# Patient Record
Sex: Female | Born: 1970 | Race: White | Hispanic: No | Marital: Married | Smoking: Former smoker
Health system: Southern US, Community
[De-identification: ages and names within clinical notes are randomized; demographics above are authoritative.]

## PROBLEM LIST (undated history)

## (undated) DIAGNOSIS — M069 Rheumatoid arthritis, unspecified: Secondary | ICD-10-CM

## (undated) DIAGNOSIS — K635 Polyp of colon: Secondary | ICD-10-CM

## (undated) DIAGNOSIS — K589 Irritable bowel syndrome without diarrhea: Secondary | ICD-10-CM

## (undated) DIAGNOSIS — I729 Aneurysm of unspecified site: Secondary | ICD-10-CM

## (undated) DIAGNOSIS — I7300 Raynaud's syndrome without gangrene: Secondary | ICD-10-CM

## (undated) DIAGNOSIS — I700 Atherosclerosis of aorta: Secondary | ICD-10-CM

## (undated) DIAGNOSIS — Q453 Other congenital malformations of pancreas and pancreatic duct: Secondary | ICD-10-CM

## (undated) DIAGNOSIS — J439 Emphysema, unspecified: Secondary | ICD-10-CM

## (undated) DIAGNOSIS — K219 Gastro-esophageal reflux disease without esophagitis: Secondary | ICD-10-CM

## (undated) DIAGNOSIS — K580 Irritable bowel syndrome with diarrhea: Secondary | ICD-10-CM

## (undated) HISTORY — DX: Atherosclerosis of aorta: I70.0

## (undated) HISTORY — DX: Gastro-esophageal reflux disease without esophagitis: K21.9

## (undated) HISTORY — DX: Other congenital malformations of pancreas and pancreatic duct: Q45.3

## (undated) HISTORY — PX: ELBOW SURGERY: SHX618

## (undated) HISTORY — DX: Irritable bowel syndrome with diarrhea: K58.0

## (undated) HISTORY — DX: Irritable bowel syndrome, unspecified: K58.9

## (undated) HISTORY — PX: SPINE SURGERY: SHX786

## (undated) HISTORY — DX: Polyp of colon: K63.5

## (undated) HISTORY — DX: Raynaud's syndrome without gangrene: I73.00

## (undated) HISTORY — PX: BRAIN SURGERY: SHX531

## (undated) HISTORY — DX: Aneurysm of unspecified site: I72.9

## (undated) HISTORY — DX: Emphysema, unspecified: J43.9

## (undated) HISTORY — DX: Rheumatoid arthritis, unspecified: M06.9

---
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
## 1999-03-12 HISTORY — PX: LUMBAR SPINE SURGERY: SHX701

## 2008-07-22 ENCOUNTER — Ambulatory Visit: Payer: Self-pay | Admitting: Family Medicine

## 2008-08-15 ENCOUNTER — Ambulatory Visit: Payer: Self-pay | Admitting: Family Medicine

## 2008-08-15 DIAGNOSIS — R599 Enlarged lymph nodes, unspecified: Secondary | ICD-10-CM | POA: Insufficient documentation

## 2008-08-19 ENCOUNTER — Telehealth: Payer: Self-pay | Admitting: Family Medicine

## 2008-08-22 ENCOUNTER — Encounter: Payer: Self-pay | Admitting: Family Medicine

## 2008-11-30 ENCOUNTER — Ambulatory Visit: Payer: Self-pay | Admitting: Family Medicine

## 2009-05-04 ENCOUNTER — Encounter: Payer: Self-pay | Admitting: Family Medicine

## 2009-05-05 ENCOUNTER — Telehealth (INDEPENDENT_AMBULATORY_CARE_PROVIDER_SITE_OTHER): Payer: Self-pay | Admitting: *Deleted

## 2009-12-21 ENCOUNTER — Ambulatory Visit: Payer: Self-pay | Admitting: Family Medicine

## 2009-12-21 DIAGNOSIS — R3915 Urgency of urination: Secondary | ICD-10-CM | POA: Insufficient documentation

## 2009-12-21 LAB — CONVERTED CEMR LAB
Bilirubin Urine: NEGATIVE
Glucose, Urine, Semiquant: NEGATIVE
Ketones, urine, test strip: NEGATIVE
Nitrite: NEGATIVE
Protein, U semiquant: 30
Specific Gravity, Urine: 1.02
Urobilinogen, UA: 0.2
pH: 6

## 2009-12-23 ENCOUNTER — Encounter: Payer: Self-pay | Admitting: Family Medicine

## 2010-01-10 ENCOUNTER — Ambulatory Visit: Payer: Self-pay | Admitting: Family Medicine

## 2010-01-10 DIAGNOSIS — I7300 Raynaud's syndrome without gangrene: Secondary | ICD-10-CM | POA: Insufficient documentation

## 2010-01-10 DIAGNOSIS — Z862 Personal history of diseases of the blood and blood-forming organs and certain disorders involving the immune mechanism: Secondary | ICD-10-CM | POA: Insufficient documentation

## 2010-01-12 LAB — CONVERTED CEMR LAB
ALT: 15 units/L (ref 0–35)
ANA Titer 1: 1:40 {titer} — ABNORMAL HIGH
AST: 15 units/L (ref 0–37)
Albumin: 4.4 g/dL (ref 3.5–5.2)
Alkaline Phosphatase: 36 units/L — ABNORMAL LOW (ref 39–117)
Anti Nuclear Antibody(ANA): POSITIVE — AB
BUN: 10 mg/dL (ref 6–23)
CO2: 24 meq/L (ref 19–32)
Calcium: 9.6 mg/dL (ref 8.4–10.5)
Chloride: 108 meq/L (ref 96–112)
Creatinine, Ser: 0.72 mg/dL (ref 0.40–1.20)
Glucose, Bld: 75 mg/dL (ref 70–99)
HCT: 43 % (ref 36.0–46.0)
Hemoglobin: 13.7 g/dL (ref 12.0–15.0)
Iron: 111 ug/dL (ref 42–145)
MCHC: 31.9 g/dL (ref 30.0–36.0)
MCV: 95.6 fL (ref 78.0–100.0)
Platelets: 170 10*3/uL (ref 150–400)
Potassium: 4.5 meq/L (ref 3.5–5.3)
RBC: 4.5 M/uL (ref 3.87–5.11)
RDW: 13.3 % (ref 11.5–15.5)
Sed Rate: 2 mm/hr (ref 0–22)
Sodium: 144 meq/L (ref 135–145)
TSH: 0.679 microintl units/mL (ref 0.350–4.500)
Total Bilirubin: 0.4 mg/dL (ref 0.3–1.2)
Total Protein: 6.6 g/dL (ref 6.0–8.3)
WBC: 7.2 10*3/uL (ref 4.0–10.5)

## 2010-04-10 NOTE — Assessment & Plan Note (Signed)
Summary: Possible raynauds, anemia   Vital Signs:  Patient profile:   40 year old female Height:      64.25 inches Weight:      120 pounds Pulse rate:   81 / minute BP sitting:   115 / 69  (right arm) Cuff size:   regular  Vitals Entered By: Avon Gully CMA, Duncan Dull) (January 10, 2010 9:17 AM) CC: numbness in the fingers and toes when it gets cold and at night, gets painful and tight at times has been happening x 1 year   Primary Care Provider:  [name], MD  CC:  numbness in the fingers and toes when it gets cold and at night and gets painful and tight at times has been happening x 1 year.  History of Present Illness: numbness in the fingers and toes when it gets cold and at night, gets painful and tight at times has been happening x 1 year. First time happped was at a soccer game last Spring. Says the fingers  looked greeen and shriveled form the PIPs up. Got the car and heated them up adn saw the color come back to them gradually like a line going up her fingers.  Very painful when this happens. Has been happending recurrently.  Now 2 toes on her right foot do it.  Takes about 20 minutes to get feeling back in her hands.  Will usually start with her right hand and usually the 4th and 5th fingers adn then will affect the other finger and her left hand.  Occ hands go numb at night but without the pain or color change. The enumbness that happens at night is usually on the side she is sleeping on.  Doesn't happen every night.   Has been having sweats at night since had her last child.  Also some weight loss after her child.  She was wondered if it could be her thyroid. Also she did have iron def anemia during her pregancy and this has not been rechecked.   Warts on her hand have been there for 10 years. Had had 3 unsuccessful freezes with liquid nitrogen.   Current Medications (verified): 1)  None  Allergies (verified): No Known Drug Allergies  Comments:  Nurse/Medical  Assistant: The patient's medications and allergies were reviewed with the patient and were updated in the Medication and Allergy Lists. Avon Gully CMA, Duncan Dull) (January 10, 2010 9:19 AM)  Social History: Reviewed history from 07/22/2008 and no changes required. 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  Married to scott wiht 3 kids.  Daughter Ashlyn.   Current Smoker Alcohol use-yes Drug use-no Regular exercise-yes  Physical Exam  General:  Well-developed,well-nourished,in no acute distress; alert,appropriate and cooperative throughout examination Skin:  Hands appear normal today. No swellking . no joint a abnormalities or redness.    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 will check ANA and sed rate to rule out other causes. Will also check her thyroid as has had night sweats for some time.  I think the numbness at night is completelyunrelaed and is likely positional to her side sleeping, since occurs on teh side she is sleeping on.  Dsicussed strategies to keep her hands warm.  Can sometimes use calcium channel blockers but these will likely lower her BP and she already runs low.  Orders: T-TSH [id number]) T-Comprehensive Metabolic Panel [phone]) T-CBC No Diff ([id number]) T-ANA (  [phone]) T-Sed Rate (Automated) [phone])  Problem # 2:  ANEMIA, HX OF (ICD-V12.3) Will recheck to make sure her iron def during pregnancy has resolved.  Orders: T-Iron ([id number])  Problem # 3:  WART, VIRAL (ICD-078.10) Reviewd the duct tape method. H.O. given.  Does have a wart on her first and ring finger on her left hand.   Other Orders: Flu Vaccine 61yrs + (01027) Admin 1st Vaccine (25366)  Patient Instructions: 1)  We will call you with the lab results.     Orders Added: 1)  T-TSH [[id number]] 2)  T-Comprehensive Metabolic Panel [[id number]] 3)  T-CBC No Diff  [[id number]] 4)  T-Iron [56387-56433] 5)  T-ANA [[id number]] 6)  T-Sed Rate (Automated) [06301-60109] 7)  Flu Vaccine 54yrs + [32355] 8)  Admin 1st Vaccine [90471] 9)  Est. Patient Level IV [73220]   Immunizations Administered:  Influenza Vaccine # 1:    Vaccine Type: Fluvax 3+    Site: right deltoid    Mfr: GlaxoSmithKline    Dose: 0.5 ml    Route: IM    Given by: Sue Lush McCrimmon CMA, (AAMA)    Exp. Date: 09/08/2010    Lot #: [id number]    VIS given: 10/03/09 version given January 10, 2010.  Flu Vaccine Consent Questions:    Do you have a history of severe allergic reactions to this vaccine? no    Any prior history of allergic reactions to egg and/or gelatin? no    Do you have a sensitivity to the preservative Thimersol? no    Do you have a past history of Guillan-Barre Syndrome? no    Do you currently have an acute febrile illness? no    Have you ever had a severe reaction to latex? no    Vaccine information given and explained to patient? no    Are you currently pregnant? no   Immunizations Administered:  Influenza Vaccine # 1:    Vaccine Type: Fluvax 3+    Site: right deltoid    Mfr: GlaxoSmithKline    Dose: 0.5 ml    Route: IM    Given by: Sue Lush McCrimmon CMA, (AAMA)    Exp. Date: 09/08/2010    Lot #: [id number]    VIS given: 10/03/09 version given January 10, 2010.

## 2010-04-10 NOTE — Assessment & Plan Note (Signed)
Summary: LN, right breast   Vital Signs:  Patient profile:   40 year old female Height:      64.25 inches Weight:      117 pounds BMI:     20.00 Pulse rate:   72 / minute BP sitting:   94 / 62  (left arm) Cuff size:   regular  Vitals Entered By: Kathlene November (August 15, 2008 10:12 AM) CC: noticed yesterday that right side of right breast tender and swollen lymph node Is Patient Diabetic? No   Primary Care Provider:  [name], MD  CC:  noticed yesterday that right side of right breast tender and swollen lymph node.  History of Present Illness: noticed yesterday that right side of right breast tender and swollen lymph node.  Noticed yesterday when picked up her child and it pressed on that area and felt tender. Denies any redness or trauma or skin lesion.  She is not breastfeeding.  Has avoided wearing a bra since  noticed it.  She has mirena IUD so doesn' really have periods.   Current Medications (verified): 1)  None  Allergies (verified): No Known Drug Allergies  Comments:  Nurse/Medical Assistant: The patient's medications and allergies were reviewed with the patient and were updated in the Medication and Allergy Lists. Kathlene November (August 15, 2008 10:13 AM)  Physical Exam  General:  Well-developed,well-nourished,in no acute distress; alert,appropriate and cooperative throughout examination Head:  Normocephalic and atraumatic without obvious abnormalities. No apparent alopecia or balding. Neck:  No Supraclavicular nodes.  No TM.  Breasts:  skin/areolae normal and no abnormal thickening.  Left breast larger then right.  Able to palpate a kidney bean sized LN that is round and mobile along the outer edge of the breast at 9 o'clock position. She feels a little tender below the actual LN.  Cervical Nodes:  No lymphadenopathy noted Axillary Nodes:  No palpable lymphadenopathy   Impression & Recommendations:  Problem # 1:  LYMPHADENOPATHY (ICD-785.6) Discussed to  monitor for 1-2 weeks to see if resovles. If not pt to call back and wills et up for [place] and diagnostic mammogram.

## 2010-04-10 NOTE — Assessment & Plan Note (Signed)
Summary: NOV: Meet MD   Vital Signs:  Patient profile:   41 year old female Height:      64.25 inches Weight:      118 pounds Temp:     98.4 degrees F oral Pulse rate:   69 / minute Resp:     18 per minute BP sitting:   100 / 52  (left arm)  Vitals Entered By: Jeremy Johann CMA (Jul 22, 2008 9:13 AM) CC: new to establish   History of Present Illness: Here to meet physician and see if this a good fit for her family. Has been dringing to [facility] for the last 13 year to see her family doc there.  Wants something more local.   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  Married to scott wiht 3 kids.  Daughter Ashlyn.      Current Smoker    Alcohol use-yes    Drug use-no    Regular exercise-yes    Smoking Status:  current    Packs/Day:  0.5    Does Patient Exercise:  yes    Drug Use:  no    Seat Belt Use:  always    Sun Exposure-Excessive:  frequent  Review of Systems       No fever/sweats/weakness, unexplained weight loss/gain.  No vison changes.  No difficulty hearing/ringing in ears, hay fever/allergies.  No chest pain/discomfort, palpitations.  No Br lump/nipple discharge.  No cough/wheeze.  No blood in BM, nausea/vomiting/diarrhea.  No nighttime urination, leaking urine, unusual vaginal bleeding, discharge (penis or vagina).  No muscle/joint pain. No rash, change in mole.  No HA, memory  loss.  No anxiety, sleep d/o, depression.  No easy bruising/bleeding, unexplained lump   Physical Exam  General:  Well-developed,well-nourished,in no acute distress; alert,appropriate and cooperative throughout examination   Other Orders: No Charge Patient Arrived (NCPA0) (NCPA0)

## 2010-04-10 NOTE — Assessment & Plan Note (Signed)
Summary: UTI  Nurse Visit   Primary Care Provider:  [name], MD   History of Present Illness: urinary urgency,pressure in abd, signs started Monday.     Impression & Recommendations:  Problem # 1:  URINARY URGENCY (ICD-788.63) Will tx and sende culture.  Orders: UA Dipstick w/o Micro (automated)  (81003) T-Urine Culture (Spectrum Order) [id number])  Complete Medication List: 1)  Cipro 500 Mg Tabs (Ciprofloxacin hcl) .... Take 1 tablet by mouth two times a day for 3 days   Allergies: No Known Drug Allergies Laboratory Results   Urine Tests  Date/Time Received: 12/21/09 Date/Time Reported: 12/21/09  Routine Urinalysis   Color: yellow Appearance: Clear Glucose: negative   (Normal Range: Negative) Bilirubin: negative   (Normal Range: Negative) Ketone: negative   (Normal Range: Negative) Spec. Gravity: 1.020   (Normal Range: 1.003-1.035) Blood: moderate   (Normal Range: Negative) pH: 6.0   (Normal Range: 5.0-8.0) Protein: 30   (Normal Range: Negative) Urobilinogen: 0.2   (Normal Range: 0-1) Nitrite: negative   (Normal Range: Negative) Leukocyte Esterace: moderate   (Normal Range: Negative)       Orders Added: 1)  UA Dipstick w/o Micro (automated)  [81003] 2)  T-Urine Culture (Spectrum Order) [[id number]] 3)  Est. Patient Level I [43329] Prescriptions: CIPRO 500 MG TABS (CIPROFLOXACIN HCL) Take 1 tablet by mouth two times a day for 3 days  #6 x 0   Entered and Authorized by:   [name] MD   Signed by:   [name] MD on 12/21/2009   Method used:   Printed then faxed to ...         RxID:   [id number]

## 2010-04-10 NOTE — Progress Notes (Signed)
----   Converted from flag ---- ---- 05/04/2009 7:51 AM, [name] MD wrote: Call pt: Needs repeat right mammo to f/u the probable LN. wAs due in December and the imaging place has been unable to contact her. ------------------------------  Pt notifeid of above instructions. KJ LPN

## 2010-04-10 NOTE — Progress Notes (Signed)
Summary: Mammogram  Phone Note Call from Patient Call back at Home Phone [phone]   Caller: Patient Call For: [name] MD Summary of Call: Pt would like to go ahead and get the mamogram done for her breast. Wants to use [facility] in [place] or [place]. Initial call taken by: Kathlene November,  August 19, 2008 9:47 AM

## 2010-04-10 NOTE — Letter (Signed)
Summary: Follow Up Information Form/[facility]  Follow Up Information Form/[facility]   Imported By: Lanelle Bal 05/09/2009 14:15:48  _____________________________________________________________________  External Attachment:    Type:   Image     Comment:   External Document

## 2010-04-10 NOTE — Assessment & Plan Note (Signed)
Summary: Acute sinusitis   Vital Signs:  Patient profile:   40 year old female Height:      64.25 inches Weight:      118 pounds Temp:     97.8 degrees F oral Pulse rate:   69 / minute BP sitting:   118 / 64  (left arm) Cuff size:   regular  Vitals Entered By: Kathlene November (November 30, 2008 10:58 AM) CC: sinus pressure and H/A for 1 1/2 weeks. Sudafed helped some   Primary Care Provider:  [name], MD  CC:  sinus pressure and H/A for 1 1/2 weeks. Sudafed helped some.  History of Present Illness: sinus pressure and H/A for 1 1/2 weeks. Sudafed OTC  helped some.  Today is better.  Sudafed makes her feel "loopy".  Pressure not quite as bad.  No fever. NO n/v/d.  No ear pain or ST.  HA nad pressure is behind and underneath the eyes.  No worsening sxs.    Current Medications (verified): 1)  None  Allergies (verified): No Known Drug Allergies  Comments:  Nurse/Medical Assistant: The patient's medications and allergies were reviewed with the patient and were updated in the Medication and Allergy Lists. Kathlene November (November 30, 2008 10:58 AM)  Physical Exam  General:  Well-developed,well-nourished,in no acute distress; alert,appropriate and cooperative throughout examination Head:  Normocephalic and atraumatic without obvious abnormalities. No apparent alopecia or balding. Eyes:  No corneal or conjunctival inflammation noted. EOMI. Perrla.  Ears:  External ear exam shows no significant lesions or deformities.  Otoscopic examination reveals clear canals, tympanic membranes are intact bilaterally without bulging, retraction, inflammation or discharge. Hearing is grossly normal bilaterally. Nose:  External nasal examination shows no deformity or inflammation. Nasal mucosa are pink and moist without lesions or exudates. Mouth:  Oral mucosa and oropharynx without lesions or exudates.  Teeth in good repair. Neck:  No deformities, masses, or tenderness noted. Lungs:  Normal  respiratory effort, chest expands symmetrically. Lungs are clear to auscultation, no crackles or wheezes. Heart:  Normal rate and regular rhythm. S1 and S2 normal without gallop, murmur, click, rub or other extra sounds. Skin:  no rashes.   Cervical Nodes:  No lymphadenopathy noted Psych:  Cognition and judgment appear intact. Alert and cooperative with normal attention span and concentration. No apparent delusions, illusions, hallucinations   Impression & Recommendations:  Problem # 1:  SINUSITIS - ACUTE-NOS (ICD-461.9) Seems a little better today. Give is a couple of more days if not resolving then can fill the rx.   Her updated medication list for this problem includes:    Amoxicillin 500 Mg Cap (Amoxicillin) .Marland Kitchen... Take 1 capsule by mouth three times a day x 10 days  Instructed on treatment. Call if symptoms persist or worsen.   Complete Medication List: 1)  Amoxicillin 500 Mg Cap (Amoxicillin) .... Take 1 capsule by mouth three times a day x 10 days Prescriptions: AMOXICILLIN 500 MG CAP (AMOXICILLIN) Take 1 capsule by mouth three times a day X 10 days  #30 x 0   Entered and Authorized by:   [name] MD   Signed by:   [name] MD on 11/30/2008   Method used:   Print then Give to Patient   RxID:   [id number]

## 2010-05-14 ENCOUNTER — Encounter: Payer: Self-pay | Admitting: Family Medicine

## 2010-05-14 LAB — CONVERTED CEMR LAB: Pap Smear: NORMAL

## 2010-05-17 ENCOUNTER — Encounter: Payer: Self-pay | Admitting: Family Medicine

## 2010-05-22 NOTE — Miscellaneous (Signed)
Summary: Pap update.   Clinical Lists Changes  Observations: Added new observation of PAP DUE: 05/2011 (05/17/2010 12:01) Added new observation of PAP SMEAR: normal (05/14/2010 12:01)      Preventive Care Screening  Pap Smear:    Date:  05/14/2010    Next Due:  05/2011    Results:  normal

## 2011-03-07 ENCOUNTER — Ambulatory Visit (INDEPENDENT_AMBULATORY_CARE_PROVIDER_SITE_OTHER): Payer: Managed Care, Other (non HMO) | Admitting: Physician Assistant

## 2011-03-07 ENCOUNTER — Encounter: Payer: Self-pay | Admitting: Physician Assistant

## 2011-03-07 VITALS — BP 101/59 | HR 70 | Temp 98.4°F | Ht 64.0 in | Wt 128.0 lb

## 2011-03-07 DIAGNOSIS — J329 Chronic sinusitis, unspecified: Secondary | ICD-10-CM

## 2011-03-07 MED ORDER — AMOXICILLIN 875 MG PO TABS: 875.0000 mg | ORAL_TABLET | Freq: Two times a day (BID) | ORAL | Status: AC

## 2011-03-07 NOTE — Progress Notes (Signed)
Subjective:     Patient ID: Dawn Griffin, female   DOB: Jul 16, 1970, 40 y.o.   MRN: [id number]  HPI Patient has had 1 month of sinus pressure, headaches, and dizziness. She began taking sudafed and it helped for a few weeks. Symptoms began to get worse about 1 1/2 weeks ago and she started to feel nauseous all the time. She reports that her whole head hurts even into her teeth. She denies fever, sore throat, chills, and ear pain. She has had some cough but not productive.    Review of Systems  Constitutional: Negative for fever, chills and diaphoresis.  HENT: Positive for congestion, postnasal drip and sinus pressure. Negative for ear pain, sore throat and rhinorrhea.   Respiratory: Positive for cough. Negative for chest tightness, shortness of breath and wheezing.   Skin: Negative for color change and pallor.  Neurological: Positive for dizziness and headaches.       Objective:   Physical Exam  Constitutional: She appears well-developed and well-nourished.  HENT:  Head: Normocephalic and atraumatic.  Right Ear: External ear normal.  Left Ear: External ear normal.  Mouth/Throat: No oropharyngeal exudate.       Oropharynx was erythematous with post nasal drip evident. Maxillary tenderness(Mild) bilaterally.  Eyes: Conjunctivae are normal.  Cardiovascular: Normal rate, regular rhythm and normal heart sounds.   Pulmonary/Chest: Effort normal and breath sounds normal. She has no wheezes. She exhibits no tenderness.  Lymphadenopathy:    She has cervical adenopathy.  Psychiatric: She has a normal mood and affect. Her behavior is normal.       Assessment:    Sinusitis    Plan:    Sinusitis: Amoxillcillin 875mg  BID for 10days given. Symptomatic Care discussed. F/U if not improving in 2-3 days.  Discussed with patient need for Tdap and Flu shot. Patient wanted to wait until the first of the year because the insurance company will pay then.

## 2011-03-07 NOTE — Patient Instructions (Signed)
Start Amoxicillin. Symptomatic Care. Call if not improving.

## 2013-01-11 ENCOUNTER — Encounter: Payer: Self-pay | Admitting: Family Medicine

## 2013-01-11 ENCOUNTER — Ambulatory Visit (INDEPENDENT_AMBULATORY_CARE_PROVIDER_SITE_OTHER): Payer: Managed Care, Other (non HMO) | Admitting: Family Medicine

## 2013-01-11 VITALS — BP 112/66 | HR 72 | Wt 126.0 lb

## 2013-01-11 DIAGNOSIS — Z0000 Encounter for general adult medical examination without abnormal findings: Secondary | ICD-10-CM

## 2013-01-11 DIAGNOSIS — M2550 Pain in unspecified joint: Secondary | ICD-10-CM

## 2013-01-11 DIAGNOSIS — Z23 Encounter for immunization: Secondary | ICD-10-CM

## 2013-01-11 MED ORDER — ESOMEPRAZOLE MAGNESIUM 40 MG PO CPDR: 40.0000 mg | DELAYED_RELEASE_CAPSULE | Freq: Every day | ORAL | Status: DC

## 2013-01-11 NOTE — Progress Notes (Signed)
Subjective:     Dawn Griffin is a 42 y.o. female and is here for a comprehensive physical exam. The patient reports no problems. She has never had a Mammogram, refused flu vaccine, tdap today, Pap 10/09/2012 normal w/ [place].   Having a lot of pain at the base of thumbs bilat.  Worse on the left.  No known fam hx of RA but GM had very deformed. She has hand stiffness for about 30-60 min   History   Social History  . Marital Status: Married    Spouse Name: N/A    Number of Children: N/A  . Years of Education: N/A   Occupational History  . Not on file.   Social History Main Topics  . Smoking status: Current Every Day Smoker -- 0.30 packs/day    Types: Cigarettes  . Smokeless tobacco: Not on file  . Alcohol Use: Not on file  . Drug Use: Not on file  . Sexual Activity: Not on file   Other Topics Concern  . Not on file   Social History Narrative  . No narrative on file   Health Maintenance  Topic Date Due  . Influenza Vaccine  10/11/2013  . Pap Smear  10/10/2015  . Tetanus/tdap  01/12/2023    The following portions of the patient's history were reviewed and updated as appropriate: allergies, current medications, past family history, past medical history, past social history, past surgical history and problem list.  Review of Systems A comprehensive review of systems was negative.   Objective:    BP 112/66  Pulse 72  Wt 126 lb (57.153 kg)  SpO2 99% General appearance: alert, cooperative and appears stated age Head: Normocephalic, without obvious abnormality, atraumatic Eyes: conj clear, EOMi, PEERLA Ears: normal TM's and external ear canals both ears Nose: Nares normal. Septum midline. Mucosa normal. No drainage or sinus tenderness. Throat: lips, mucosa, and tongue normal; teeth and gums normal Neck: no adenopathy, no carotid bruit, no JVD, supple, symmetrical, trachea midline and thyroid not enlarged, symmetric, no tenderness/mass/nodules Back: symmetric, no  curvature. ROM normal. No CVA tenderness. Lungs: clear to auscultation bilaterally Heart: regular rate and rhythm, S1, S2 normal, no murmur, click, rub or gallop Abdomen: soft, non-tender; bowel sounds normal; no masses,  no organomegaly Extremities: extremities normal, atraumatic, no cyanosis or edema Pulses: 2+ and symmetric Skin: Skin color, texture, turgor normal. No rashes or lesions Lymph nodes: Cervical, supraclavicular, and axillary nodes normal. Neurologic: Alert and oriented X 3, normal strength and tone. Normal symmetric reflexes. Normal coordination and gait    Assessment:    Healthy female exam.      Plan:     See After Visit Summary for Counseling Recommendations  Keep up a regular exercise program and make sure you are eating a healthy diet Try to eat 4 servings of dairy a day, or if you are lactose intolerant take a calcium with vitamin D daily.  Your vaccines are up to date.   GERD - would like rx for nexium sent to mail-order.  Warned about inc risk of fractures.    Discussed screening mammogram. Based on different t guidelines, the recommendation is for age 80 versus age 71 to start screening mammograms. She is low risk with no family history. Encouraged her to think about it. She also wants to check with her insurance company first. I am happy to order it or she can certainly have this ordered through her OB/GYN. Her Pap smear is up-to-date. She did have a  mammogram years ago for a swollen lymph node after pregnancy but has not had any personal history of breast problems or abnormal lumps.3  Polyarthralgia - Doesn't meet criteria for RA but has some features.  Will check labs and offered to do xrays. She does have some squaring off on of the thumb joint near the wrist.  Does have some swelling on th left hand.  Also gave her Dt. T' s card for further evaluation is having significant discomfort.

## 2013-01-12 LAB — COMPLETE METABOLIC PANEL WITH GFR
ALT: 17 U/L (ref 0–35)
AST: 16 U/L (ref 0–37)
Albumin: 4.1 g/dL (ref 3.5–5.2)
Alkaline Phosphatase: 38 U/L — ABNORMAL LOW (ref 39–117)
BUN: 10 mg/dL (ref 6–23)
CO2: 26 mEq/L (ref 19–32)
Calcium: 9.1 mg/dL (ref 8.4–10.5)
Chloride: 105 mEq/L (ref 96–112)
Creat: 0.71 mg/dL (ref 0.50–1.10)
GFR, Est African American: >89 mL/min
GFR, Est Non African American: >89 mL/min
Glucose, Bld: 86 mg/dL (ref 70–99)
Potassium: 4.1 mEq/L (ref 3.5–5.3)
Sodium: 138 mEq/L (ref 135–145)
Total Bilirubin: 0.4 mg/dL (ref 0.3–1.2)
Total Protein: 6.3 g/dL (ref 6.0–8.3)

## 2013-01-12 LAB — LIPID PANEL
Cholesterol: 144 mg/dL (ref 0–200)
HDL: 40 mg/dL (ref >39)
LDL Cholesterol: 81 mg/dL (ref 0–99)
Total CHOL/HDL Ratio: 3.6 Ratio
Triglycerides: 117 mg/dL (ref <150)
VLDL: 23 mg/dL (ref 0–40)

## 2013-01-12 LAB — SEDIMENTATION RATE: Sed Rate: 1 mm/hr (ref 0–22)

## 2013-01-12 LAB — HIGH SENSITIVITY CRP: CRP, High Sensitivity: 4.2 mg/L — ABNORMAL HIGH

## 2013-01-12 LAB — RHEUMATOID FACTOR: Rhuematoid fact SerPl-aCnc: 14 IU/mL (ref <=14)

## 2013-01-13 LAB — ANA: Anti Nuclear Antibody(ANA): POSITIVE — AB

## 2013-01-13 LAB — CYCLIC CITRUL PEPTIDE ANTIBODY, IGG: Cyclic Citrullin Peptide Ab: <2 U/mL (ref 0.0–5.0)

## 2013-01-13 LAB — ANTI-NUCLEAR AB-TITER (ANA TITER): ANA Titer 1: 1:160 {titer} — ABNORMAL HIGH

## 2013-01-14 ENCOUNTER — Other Ambulatory Visit: Payer: Self-pay | Admitting: Family Medicine

## 2013-01-14 DIAGNOSIS — R768 Other specified abnormal immunological findings in serum: Secondary | ICD-10-CM

## 2013-01-14 DIAGNOSIS — M2550 Pain in unspecified joint: Secondary | ICD-10-CM

## 2013-01-15 ENCOUNTER — Telehealth: Payer: Self-pay | Admitting: *Deleted

## 2013-01-15 NOTE — Telephone Encounter (Signed)
Pt called and wanted to find out about the referral for rheumatology, I left a vm informing her that this is being worked on and that someone from either our office or the rheumatology ofc should contact her.  If she has not heard anything by midweek she should call our office back.Dawn Griffin [place]

## 2013-01-19 NOTE — Telephone Encounter (Signed)
Pt called back and stated that she still has not heard anything about the rheumatology referral and she would like to have this taken care of before the end of the year so that she will not have to meet another deductible with insurance. Also she would prefer not to go to the woman rheumatologist here in [place] please call pt with appt information.Dawn Griffin [place]

## 2013-01-21 ENCOUNTER — Telehealth: Payer: Self-pay

## 2013-01-21 NOTE — Telephone Encounter (Signed)
She wants to go to [facility] Rheumatology department. She also needs and appointment this year.  [phone]

## 2013-01-28 DIAGNOSIS — I7301 Raynaud's syndrome with gangrene: Secondary | ICD-10-CM | POA: Insufficient documentation

## 2013-01-28 DIAGNOSIS — R768 Other specified abnormal immunological findings in serum: Secondary | ICD-10-CM | POA: Insufficient documentation

## 2013-01-28 DIAGNOSIS — I7300 Raynaud's syndrome without gangrene: Secondary | ICD-10-CM | POA: Insufficient documentation

## 2013-02-26 ENCOUNTER — Ambulatory Visit (INDEPENDENT_AMBULATORY_CARE_PROVIDER_SITE_OTHER): Payer: Managed Care, Other (non HMO) | Admitting: Sports Medicine

## 2013-02-26 ENCOUNTER — Encounter: Payer: Self-pay | Admitting: Sports Medicine

## 2013-02-26 VITALS — BP 119/68 | HR 76 | Wt 128.0 lb

## 2013-02-26 DIAGNOSIS — M19049 Primary osteoarthritis, unspecified hand: Secondary | ICD-10-CM

## 2013-02-26 DIAGNOSIS — R0602 Shortness of breath: Secondary | ICD-10-CM | POA: Insufficient documentation

## 2013-02-26 MED ORDER — CELECOXIB 200 MG PO CAPS: ORAL_CAPSULE | ORAL | Status: DC

## 2013-02-26 NOTE — Assessment & Plan Note (Addendum)
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 Insufficient response to 200 mg of Celebrex daily. Increasing to 400. Injection into the trapezial metacarpal joint as well. May use topical diclofenac gel. Return to see me in one month. When she gets flares in individual joints I can certainly provide acute interventional treatment as needed.

## 2013-02-26 NOTE — Progress Notes (Addendum)
   Subjective:    I'm seeing this patient as a consultation for:  Dr. Linford Arnold  CC: Left hand pain  HPI: This is a very pleasant 42 year old female with a history of polyarthralgia, she also has Raynaud's phenomenon, occasionally she will get diffuse swelling of her interphalangeal joints. Unfortunately for some time now she's had pain that she localizes to the left trapeziometacarpal joint. She denies any trauma or any constitutional symptoms. She has seen a rheumatologist and has a positive ANA and a positive rheumatoid factor but negative confirmatory testing. She does not quite fall into the category of rheumatoid arthritis or lupus. Symptoms are moderate, persistent, localized without radiation. 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  Past medical history, Surgical history, Family history not pertinant except as noted below, Social history, Allergies, and medications have been entered into the medical record, reviewed, and no changes needed.   Review of Systems: No headache, visual changes, nausea, vomiting, diarrhea, constipation, dizziness, abdominal pain, skin rash, fevers, chills, night sweats, weight loss, swollen lymph nodes, body aches, joint swelling, muscle aches, chest pain, shortness of breath, mood changes, visual or auditory hallucinations.   Objective:   General: Well Developed, well nourished, and in no acute distress.  Neuro/Psych: Alert and oriented x3, extra-ocular muscles intact, able to move all 4 extremities, sensation grossly intact. Skin: Warm and dry, no rashes noted.  Respiratory: Not using accessory muscles, speaking in full sentences, trachea midline.  Cardiovascular: Pulses palpable, no extremity edema. Abdomen: Does not appear distended. Left hand: There is squaring of the thumb nasal joint, she has tenderness to palpation from the volar aspect at the trapeziometacarpal joint itself. Negative Finkelstein  test, no numbness or tingling. Only minimal swelling.  Procedure: Real-time Ultrasound Guided Injection of left trapeziometacarpal joint Device: GE Logiq E  Verbal informed consent obtained.  Time-out conducted.  Noted no overlying erythema, induration, or other signs of local infection.  Skin prepped in a sterile fashion.  Local anesthesia: Topical Ethyl chloride.  With sterile technique and under real time ultrasound guidance:  0.5 cc Kenalog 40, 0.5 cc lidocaine injected easily into the trapeziometacarpal joint. Completed without difficulty  Pain immediately resolved suggesting accurate placement of the medication.  Advised to call if fevers/chills, erythema, induration, drainage, or persistent bleeding.  Images permanently stored and available for review in the ultrasound unit.  Impression: Technically successful ultrasound guided injection.  Impression and Recommendations:   This case required medical decision making of moderate complexity.

## 2013-03-26 ENCOUNTER — Ambulatory Visit: Payer: Managed Care, Other (non HMO) | Admitting: Sports Medicine

## 2013-03-26 DIAGNOSIS — Z0289 Encounter for other administrative examinations: Secondary | ICD-10-CM

## 2013-07-19 ENCOUNTER — Ambulatory Visit (INDEPENDENT_AMBULATORY_CARE_PROVIDER_SITE_OTHER): Payer: Managed Care, Other (non HMO) | Admitting: Sports Medicine

## 2013-07-19 ENCOUNTER — Encounter: Payer: Self-pay | Admitting: Sports Medicine

## 2013-07-19 VITALS — BP 125/73 | HR 72 | Ht 64.0 in | Wt 131.0 lb

## 2013-07-19 DIAGNOSIS — M19049 Primary osteoarthritis, unspecified hand: Secondary | ICD-10-CM

## 2013-07-19 MED ORDER — TRAMADOL HCL 50 MG PO TABS: ORAL_TABLET | ORAL | Status: DC

## 2013-07-19 NOTE — Progress Notes (Signed)
  Subjective:    CC: Followup  HPI: Left trapezial metacarpal joint arthritis: I last injected the trapezial metacarpal joint approximately 6 months ago. She had a fantastic response with recurrence of pain a few days ago. Pain is now severe and persistent. She is getting significant swelling.  Past medical history, Surgical history, Family history not pertinant except as noted below, Social history, Allergies, and medications have been entered into the medical record, reviewed, and no changes needed.   Review of Systems: No fevers, chills, night sweats, weight loss, chest pain, or shortness of breath.   Objective:    General: Well Developed, well nourished, and in no acute distress.  Neuro: Alert and oriented x3, extra-ocular muscles intact, sensation grossly intact.  HEENT: Normocephalic, atraumatic, pupils equal round reactive to light, neck supple, no masses, no lymphadenopathy, thyroid nonpalpable.  Skin: Warm and dry, no rashes. Cardiac: Regular rate and rhythm, no murmurs rubs or gallops, no lower extremity edema.  Respiratory: Clear to auscultation bilaterally. Not using accessory muscles, speaking in full sentences. Left hand: There is swelling with a palpable fluid wave at the first carpometacarpal joint.  Procedure: Real-time Ultrasound Guided Injection of left trapezial metacarpal joint Device: GE Logiq E  Verbal informed consent obtained.  Time-out conducted.  Noted no overlying erythema, induration, or other signs of local infection.  Skin prepped in a sterile fashion.  Local anesthesia: Topical Ethyl chloride.  With sterile technique and under real time ultrasound guidance:  Noted copious synovitis around the joint, 25-gauge needle advanced into the joint and 0.5 cc Kenalog 40, 0.5 cc lidocaine injected. Completed without difficulty  Pain immediately resolved suggesting accurate placement of the medication.  Advised to call if fevers/chills, erythema, induration,  drainage, or persistent bleeding.  Images permanently stored and available for review in the ultrasound unit.  Impression: Technically successful ultrasound guided injection.  Impression and Recommendations:

## 2013-07-19 NOTE — Assessment & Plan Note (Signed)
With copious synovitis on ultrasound today. Ultrasound guided injection as above. Return in one month. Tramadol for pain.

## 2013-08-17 ENCOUNTER — Ambulatory Visit (INDEPENDENT_AMBULATORY_CARE_PROVIDER_SITE_OTHER): Payer: Managed Care, Other (non HMO) | Admitting: Sports Medicine

## 2013-08-17 ENCOUNTER — Encounter: Payer: Self-pay | Admitting: Sports Medicine

## 2013-08-17 VITALS — BP 102/61 | HR 73 | Wt 130.0 lb

## 2013-08-17 DIAGNOSIS — R209 Unspecified disturbances of skin sensation: Secondary | ICD-10-CM

## 2013-08-17 DIAGNOSIS — M19049 Primary osteoarthritis, unspecified hand: Secondary | ICD-10-CM

## 2013-08-17 DIAGNOSIS — R200 Anesthesia of skin: Secondary | ICD-10-CM | POA: Insufficient documentation

## 2013-08-17 NOTE — Progress Notes (Signed)
    Subjective:    CC: Followup  HPI: Hand osteoarthritis: Jamilah had a trapeziometacarpal injection at the last visit, she still reports pain this time not a trapeziometacarpal, she reports it more proximal at the scaphotrapezial joint.  Past medical history, Surgical history, Family history not pertinant except as noted below, Social history, Allergies, and medications have been entered into the medical record, reviewed, and no changes needed.   Review of Systems: No fevers, chills, night sweats, weight loss, chest pain, or shortness of breath.   Objective:    General: Well Developed, well nourished, and in no acute distress.  Neuro: Alert and oriented x3, extra-ocular muscles intact, sensation grossly intact.  HEENT: Normocephalic, atraumatic, pupils equal round reactive to light, neck supple, no masses, no lymphadenopathy, thyroid nonpalpable.  Skin: Warm and dry, no rashes. Cardiac: Regular rate and rhythm, no murmurs rubs or gallops, no lower extremity edema. Respiratory: Clear to auscultation bilaterally. Not using accessory muscles, speaking in full sentences. Left hand: Tender to palpation at the scaphoid/trapezial joint, trapeziometacarpal joint is nontender. She does have some grinding as the wound is taken through the range of motion.  Procedure: Real-time Ultrasound Guided Injection of left scaphoid/trapezial joint Device: GE Logiq E  Verbal informed consent obtained.  Time-out conducted.  Noted no overlying erythema, induration, or other signs of local infection.  Skin prepped in a sterile fashion.  Local anesthesia: Topical Ethyl chloride.  With sterile technique and under real time ultrasound guidance:  25-gauge needle advanced into the joint, 0.5 cc Kenalog 40, 0.5 cc lidocaine injected easily. Completed without difficulty  Pain immediately resolved suggesting accurate placement of the medication.  Advised to call if fevers/chills, erythema, induration, drainage, or  persistent bleeding.  Images permanently stored and available for review in the ultrasound unit.  Impression: Technically successful ultrasound guided injection.  Impression and Recommendations:

## 2013-08-17 NOTE — Assessment & Plan Note (Signed)
Referral for nerve conduction. She's already wearing wrist braces at night. Median nerve did look slightly large on unofficial ultrasound.

## 2013-08-17 NOTE — Assessment & Plan Note (Signed)
Injection placed into the trapezial metacarpal joint the last visit, this is still pain-free. Today we placed an injection in the scaphoid trapezial joint. Return to go over nerve conduction studies.

## 2015-03-08 LAB — HM COLONOSCOPY

## 2015-04-06 ENCOUNTER — Encounter: Payer: Self-pay | Admitting: Family Medicine

## 2015-05-29 LAB — HM MAMMOGRAPHY

## 2015-05-29 LAB — HM PAP SMEAR: HM Pap smear: ABNORMAL

## 2016-02-27 ENCOUNTER — Encounter: Payer: Managed Care, Other (non HMO) | Admitting: Family Medicine

## 2016-03-14 ENCOUNTER — Encounter: Payer: Self-pay | Admitting: Family Medicine

## 2016-03-14 ENCOUNTER — Ambulatory Visit (INDEPENDENT_AMBULATORY_CARE_PROVIDER_SITE_OTHER): Payer: Managed Care, Other (non HMO) | Admitting: Family Medicine

## 2016-03-14 VITALS — BP 117/65 | HR 87 | Ht 64.0 in | Wt 129.0 lb

## 2016-03-14 DIAGNOSIS — M359 Systemic involvement of connective tissue, unspecified: Secondary | ICD-10-CM

## 2016-03-14 DIAGNOSIS — Z720 Tobacco use: Secondary | ICD-10-CM | POA: Diagnosis not present

## 2016-03-14 DIAGNOSIS — Z0000 Encounter for general adult medical examination without abnormal findings: Secondary | ICD-10-CM | POA: Diagnosis not present

## 2016-03-14 DIAGNOSIS — D8989 Other specified disorders involving the immune mechanism, not elsewhere classified: Secondary | ICD-10-CM

## 2016-03-14 MED ORDER — VARENICLINE TARTRATE 0.5 MG X 11 & 1 MG X 42 PO MISC: ORAL | 0 refills | Status: DC

## 2016-03-14 NOTE — Patient Instructions (Signed)
Keep up a regular exercise program and make sure you are eating a healthy diet Try to eat 4 servings of dairy a day, or if you are lactose intolerant take a calcium with vitamin D daily.  Your vaccines are up to date.   

## 2016-03-14 NOTE — Progress Notes (Signed)
Subjective:     Dawn Griffin is a 46 y.o. female and is here for a comprehensive physical exam. The patient reports problems - wants to get re-estab with rheum. Goes to  [place] for her Pap smear and mammogram.  Patient says she comes in today would like to have a new referral to rheumatology. She was seeing Dr. Stephens Shire unit a couple of years ago and was worked up for autoimmune disorder. She does have a diagnosis of Raynaud's but also continues to get persistent swelling of the DIPs and MCPs. She also started getting some deformity at the base of the left thumb. She's had multiple steroid injections in that thumb. At that point she had some positive markers on her blood work but not enough to meet full criteria for diagnosis. Dr. Stephens Shire he left [place] and she has not seen a rheumatologist since then. She said she's at the point where she would like to get reestablished. She's been taking an over-the-counter medication called repair a general which has cats claw in it. She actually has gotten some pain relief with this medication. She also takes a probiotic over-the-counter.  Tobacco abuse-she's also interested in quitting smoking. Her husband still smokes but she says she's ready to try even though he is not ready. She is interested in trying Chantix. She has never taken it before.  Social History   Social History  . Marital status: Married    Spouse name: N/A  . Number of children: N/A  . Years of education: N/A   Occupational History  . Not on file.   Social History Main Topics  . Smoking status: Current Every Day Smoker    Packs/day: 0.30    Types: Cigarettes  . Smokeless tobacco: Not on file  . Alcohol use 0.5 oz/week    1 drink(s) per week  . Drug use: Unknown  . Sexual activity: Yes    Partners: Male   Other Topics Concern  . Not on file   Social History Narrative   No exercise. + daily caffeine.    Health Maintenance  Topic Date Due  . HIV Screening   10/28/1987  . PAP SMEAR  10/10/2015  . INFLUENZA VACCINE  06/09/2023 (Originally 10/10/2015)  . MAMMOGRAM  05/28/2017  . COLONOSCOPY  03/07/2018  . TETANUS/TDAP  01/12/2023    The following portions of the patient's history were reviewed and updated as appropriate: allergies, current medications, past family history, past medical history, past social history, past surgical history and problem list.  Review of Systems A comprehensive review of systems was negative.   Objective:    BP 117/65   Pulse 87   Ht 5\' 4"  (1.626 m)   Wt 129 lb (58.5 kg)   SpO2 100%   BMI 22.14 kg/m  General appearance: alert, cooperative and appears stated age Head: Normocephalic, without obvious abnormality, atraumatic Eyes: conj clear, EOMI, PEERLA Ears: normal TM's and external ear canals both ears Nose: Nares normal. Septum midline. Mucosa normal. No drainage or sinus tenderness. Throat: lips, mucosa, and tongue normal; teeth and gums normal Neck: no adenopathy, no carotid bruit, no JVD, supple, symmetrical, trachea midline and thyroid not enlarged, symmetric, no tenderness/mass/nodules Back: symmetric, no curvature. ROM normal. No CVA tenderness. Lungs: clear to auscultation bilaterally Heart: regular rate and rhythm, S1, S2 normal, no murmur, click, rub or gallop Abdomen: soft, non-tender; bowel sounds normal; no masses,  no organomegaly and fingers mildly swollen at the PIPs. deformity of the base of the  left thrumb Extremities: extremities normal, atraumatic, no cyanosis or edema Pulses: 2+ and symmetric    Assessment:    Healthy female exam.      Plan:     See After Visit Summary for Counseling Recommendations   Keep up a regular exercise program and make sure you are eating a healthy diet Try to eat 4 servings of dairy a day, or if you are lactose intolerant take a calcium with vitamin D daily.  Your vaccines are up to date.   Will call for Pap smear and mammogram report.  Autoimmune  disorder-we'll place referral to rheumatology.  Tobacco abuse-discussed Chantix. Discussed side effects and benefits of the medication. Perception to the pharmacy. Monitor for nausea and mid change.

## 2016-03-18 LAB — COMPLETE METABOLIC PANEL WITH GFR
ALT: 19 U/L (ref 6–29)
AST: 20 U/L (ref 10–30)
Albumin: 4 g/dL (ref 3.6–5.1)
Alkaline Phosphatase: 38 U/L (ref 33–115)
BUN: 14 mg/dL (ref 7–25)
CO2: 26 mmol/L (ref 20–31)
Calcium: 9.3 mg/dL (ref 8.6–10.2)
Chloride: 107 mmol/L (ref 98–110)
Creat: 0.79 mg/dL (ref 0.50–1.10)
GFR, Est African American: >89 mL/min (ref >=60)
GFR, Est Non African American: >89 mL/min (ref >=60)
Glucose, Bld: 91 mg/dL (ref 65–99)
Potassium: 4 mmol/L (ref 3.5–5.3)
Sodium: 140 mmol/L (ref 135–146)
Total Bilirubin: 0.5 mg/dL (ref 0.2–1.2)
Total Protein: 6.5 g/dL (ref 6.1–8.1)

## 2016-03-18 LAB — CBC WITH DIFFERENTIAL/PLATELET
Basophils Absolute: 74 cells/uL (ref 0–200)
Basophils Relative: 1 %
Eosinophils Absolute: 222 cells/uL (ref 15–500)
Eosinophils Relative: 3 %
HCT: 39.2 % (ref 35.0–45.0)
Hemoglobin: 12.6 g/dL (ref 11.7–15.5)
Lymphocytes Relative: 40 %
Lymphs Abs: 2960 cells/uL (ref 850–3900)
MCH: 29.9 pg (ref 27.0–33.0)
MCHC: 32.1 g/dL (ref 32.0–36.0)
MCV: 92.9 fL (ref 80.0–100.0)
MPV: 10.4 fL (ref 7.5–12.5)
Monocytes Absolute: 518 cells/uL (ref 200–950)
Monocytes Relative: 7 %
Neutro Abs: 3626 cells/uL (ref 1500–7800)
Neutrophils Relative %: 49 %
Platelets: 230 10*3/uL (ref 140–400)
RBC: 4.22 MIL/uL (ref 3.80–5.10)
RDW: 13 % (ref 11.0–15.0)
WBC: 7.4 10*3/uL (ref 3.8–10.8)

## 2016-03-18 LAB — LIPID PANEL
Cholesterol: 151 mg/dL (ref <200)
HDL: 47 mg/dL — ABNORMAL LOW (ref >50)
LDL Cholesterol: 82 mg/dL (ref <100)
Total CHOL/HDL Ratio: 3.2 Ratio (ref <5.0)
Triglycerides: 111 mg/dL (ref <150)
VLDL: 22 mg/dL (ref <30)

## 2016-03-18 LAB — TSH: TSH: 1.41 mIU/L

## 2016-03-27 ENCOUNTER — Encounter: Payer: Self-pay | Admitting: Family Medicine

## 2016-05-22 ENCOUNTER — Telehealth: Payer: Self-pay

## 2016-05-22 NOTE — Telephone Encounter (Signed)
Pt called, saw rheumatologist, and dx with RA.  He put her on methotrexate 6 pills once a week.  She read up on the disease and medication, and wants your opinion as to if the benefits of the medicine out weigh the side effects of it.  She is concerned about the stomach issues and hair loss.  She did take it this morning, but wants your opinion.

## 2016-05-24 ENCOUNTER — Telehealth: Payer: Self-pay

## 2016-05-24 NOTE — Telephone Encounter (Signed)
I let her know you would call.

## 2016-05-24 NOTE — Telephone Encounter (Signed)
IPlease let her know that I  did get message and I will call her.

## 2016-05-27 ENCOUNTER — Telehealth: Payer: Self-pay | Admitting: Family Medicine

## 2016-05-27 NOTE — Telephone Encounter (Signed)
Call pt: please let her know I had access to Dr. Sebastian Ache notes but none of the actual lab work. I don't know if she drew her labs in the office. If she sent her to [facility] then we may be able to call for those. She was hoping to compare her old and new numbers.    Beatrice Lecher, MD

## 2016-05-27 NOTE — Telephone Encounter (Signed)
Spoke with Pt, states Dr Dossie Der sent her labs out by mail. Pt believes they were sent to [place]. Pt is going to find copies and bring to PCP for review.

## 2017-05-20 ENCOUNTER — Ambulatory Visit (INDEPENDENT_AMBULATORY_CARE_PROVIDER_SITE_OTHER): Payer: Managed Care, Other (non HMO) | Admitting: Family Medicine

## 2017-05-20 ENCOUNTER — Encounter: Payer: Self-pay | Admitting: Family Medicine

## 2017-05-20 VITALS — BP 115/66 | HR 85 | Wt 133.0 lb

## 2017-05-20 DIAGNOSIS — Z0000 Encounter for general adult medical examination without abnormal findings: Secondary | ICD-10-CM | POA: Diagnosis not present

## 2017-05-20 DIAGNOSIS — M069 Rheumatoid arthritis, unspecified: Secondary | ICD-10-CM | POA: Insufficient documentation

## 2017-05-20 DIAGNOSIS — M0579 Rheumatoid arthritis with rheumatoid factor of multiple sites without organ or systems involvement: Secondary | ICD-10-CM | POA: Diagnosis not present

## 2017-05-20 NOTE — Patient Instructions (Signed)
Preventive Care 18-39 Years, Female Preventive care refers to lifestyle choices and visits with your health care provider that can promote health and wellness. What does preventive care include?  A yearly physical exam. This is also called an annual well check.  Dental exams once or twice a year.  Routine eye exams. Ask your health care provider how often you should have your eyes checked.  Personal lifestyle choices, including: ? Daily care of your teeth and gums. ? Regular physical activity. ? Eating a healthy diet. ? Avoiding tobacco and drug use. ? Limiting alcohol use. ? Practicing safe sex. ? Taking vitamin and mineral supplements as recommended by your health care provider. What happens during an annual well check? The services and screenings done by your health care provider during your annual well check will depend on your age, overall health, lifestyle risk factors, and family history of disease. Counseling Your health care provider may ask you questions about your:  Alcohol use.  Tobacco use.  Drug use.  Emotional well-being.  Home and relationship well-being.  Sexual activity.  Eating habits.  Work and work Statistician.  Method of birth control.  Menstrual cycle.  Pregnancy history.  Screening You may have the following tests or measurements:  Height, weight, and BMI.  Diabetes screening. This is done by checking your blood sugar (glucose) after you have not eaten for a while (fasting).  Blood pressure.  Lipid and cholesterol levels. These may be checked every 5 years starting at age 38.  Skin check.  Hepatitis C blood test.  Hepatitis B blood test.  Sexually transmitted disease (STD) testing.  BRCA-related cancer screening. This may be done if you have a family history of breast, ovarian, tubal, or peritoneal cancers.  Pelvic exam and Pap test. This may be done every 3 years starting at age 38. Starting at age 30, this may be done  every 5 years if you have a Pap test in combination with an HPV test.  Discuss your test results, treatment options, and if necessary, the need for more tests with your health care provider. Vaccines Your health care provider may recommend certain vaccines, such as:  Influenza vaccine. This is recommended every year.  Tetanus, diphtheria, and acellular pertussis (Tdap, Td) vaccine. You may need a Td booster every 10 years.  Varicella vaccine. You may need this if you have not been vaccinated.  HPV vaccine. If you are 39 or younger, you may need three doses over 6 months.  Measles, mumps, and rubella (MMR) vaccine. You may need at least one dose of MMR. You may also need a second dose.  Pneumococcal 13-valent conjugate (PCV13) vaccine. You may need this if you have certain conditions and were not previously vaccinated.  Pneumococcal polysaccharide (PPSV23) vaccine. You may need one or two doses if you smoke cigarettes or if you have certain conditions.  Meningococcal vaccine. One dose is recommended if you are age 68-21 years and a first-year college student living in a residence hall, or if you have one of several medical conditions. You may also need additional booster doses.  Hepatitis A vaccine. You may need this if you have certain conditions or if you travel or work in places where you may be exposed to hepatitis A.  Hepatitis B vaccine. You may need this if you have certain conditions or if you travel or work in places where you may be exposed to hepatitis B.  Haemophilus influenzae type b (Hib) vaccine. You may need this  if you have certain risk factors.  Talk to your health care provider about which screenings and vaccines you need and how often you need them. This information is not intended to replace advice given to you by your health care provider. Make sure you discuss any questions you have with your health care provider. Document Released: 04/23/2001 Document Revised:  11/15/2015 Document Reviewed: 12/27/2014 Elsevier Interactive Patient Education  2018 Elsevier Inc[employer]  

## 2017-05-20 NOTE — Progress Notes (Signed)
Subjective:     Dawn Griffin is a 47 y.o. female and is here for a comprehensive physical exam. The patient reports no problems.  She is been working out Mondays, Wednesdays and Fridays.  She does go for yearly skin checks her dermatologist.  She also sees her OB/GYN.  In fact she reports that her he did her Pap smear within the last month and is scheduling her mammogram.  Social History   Socioeconomic History  . Marital status: Married    Spouse name: Not on file  . Number of children: Not on file  . Years of education: Not on file  . Highest education level: Not on file  Social Needs  . Financial resource strain: Not on file  . Food insecurity - worry: Not on file  . Food insecurity - inability: Not on file  . Transportation needs - medical: Not on file  . Transportation needs - non-medical: Not on file  Occupational History  . Not on file  Tobacco Use  . Smoking status: Current Every Day Smoker    Packs/day: 0.30    Types: Cigarettes  Substance and Sexual Activity  . Alcohol use: Yes    Alcohol/week: 0.5 oz    Types: 1 drink(s) per week  . Drug use: Not on file  . Sexual activity: Yes    Partners: Male  Other Topics Concern  . Not on file  Social History Narrative   No exercise. + daily caffeine.    Health Maintenance  Topic Date Due  . HIV Screening  10/28/1987  . INFLUENZA VACCINE  06/09/2023 (Originally 10/09/2016)  . MAMMOGRAM  05/28/2017  . COLONOSCOPY  03/07/2018  . PAP SMEAR  05/29/2018  . TETANUS/TDAP  01/12/2023    The following portions of the patient's history were reviewed and updated as appropriate: allergies, current medications, past family history, past medical history, past social history, past surgical history and problem list.  Review of Systems A comprehensive review of systems was negative.   Objective:    BP 115/66   Pulse 85   Wt 133 lb (60.3 kg)   BMI 22.83 kg/m  General appearance: alert, cooperative and appears stated  age Head: Normocephalic, without obvious abnormality, atraumatic Eyes: conjunctivae/corneas clear. PERRL, EOM's intact. Fundi benign. Ears: normal TM's and external ear canals both ears and conj clear, EOMI, PEERLA Nose: Nares normal. Septum midline. Mucosa normal. No drainage or sinus tenderness. Throat: lips, mucosa, and tongue normal; teeth and gums normal Neck: no adenopathy, no carotid bruit, no JVD, supple, symmetrical, trachea midline and thyroid not enlarged, symmetric, no tenderness/mass/nodules Back: symmetric, no curvature. ROM normal. No CVA tenderness. Lungs: clear to auscultation bilaterally Heart: regular rate and rhythm, S1, S2 normal, no murmur, click, rub or gallop Abdomen: soft, non-tender; bowel sounds normal; no masses,  no organomegaly Extremities: extremities normal, atraumatic, no cyanosis or edema Pulses: 2+ and symmetric Skin: Skin color, texture, turgor normal. No rashes or lesions Lymph nodes: Cervical adenopathy: nl and Supraclavicular adenopathy: nl Neurologic: Alert and oriented X 3, normal strength and tone. Normal symmetric reflexes. Normal coordination and gait    Assessment:    Healthy female exam.     Plan:     See After Visit Summary for Counseling Recommendations   Keep up a regular exercise program and make sure you are eating a healthy diet Try to eat 4 servings of dairy a day, or if you are lactose intolerant take a calcium with vitamin D daily.  Your vaccines  are up to date.    She will drop off form for work.    Rheumatoid arthritis-she is now on methotrexate maintenance therapy and following with Dr. symbol.

## 2017-05-21 LAB — COMPLETE METABOLIC PANEL WITH GFR
AG Ratio: 2 (calc) (ref 1.0–2.5)
ALT: 25 U/L (ref 6–29)
AST: 24 U/L (ref 10–30)
Albumin: 4.3 g/dL (ref 3.6–5.1)
Alkaline phosphatase (APISO): 37 U/L (ref 33–115)
BUN: 12 mg/dL (ref 7–25)
CO2: 25 mmol/L (ref 20–32)
Calcium: 9.5 mg/dL (ref 8.6–10.2)
Chloride: 106 mmol/L (ref 98–110)
Creat: 0.77 mg/dL (ref 0.50–1.10)
GFR, Est African American: 109 mL/min/{1.73_m2} (ref >=60)
GFR, Est Non African American: 94 mL/min/{1.73_m2} (ref >=60)
Globulin: 2.2 g/dL (calc) (ref 1.9–3.7)
Glucose, Bld: 83 mg/dL (ref 65–99)
Potassium: 4 mmol/L (ref 3.5–5.3)
Sodium: 139 mmol/L (ref 135–146)
Total Bilirubin: 0.7 mg/dL (ref 0.2–1.2)
Total Protein: 6.5 g/dL (ref 6.1–8.1)

## 2017-05-21 LAB — CBC
HCT: 39.7 % (ref 35.0–45.0)
Hemoglobin: 13.1 g/dL (ref 11.7–15.5)
MCH: 31.3 pg (ref 27.0–33.0)
MCHC: 33 g/dL (ref 32.0–36.0)
MCV: 94.7 fL (ref 80.0–100.0)
MPV: 10.5 fL (ref 7.5–12.5)
Platelets: 244 10*3/uL (ref 140–400)
RBC: 4.19 10*6/uL (ref 3.80–5.10)
RDW: 13.1 % (ref 11.0–15.0)
WBC: 7.8 10*3/uL (ref 3.8–10.8)

## 2017-05-21 LAB — LIPID PANEL
Cholesterol: 165 mg/dL (ref <200)
HDL: 50 mg/dL — ABNORMAL LOW (ref >50)
LDL Cholesterol (Calc): 91 mg/dL (calc)
Non-HDL Cholesterol (Calc): 115 mg/dL (calc) (ref <130)
Total CHOL/HDL Ratio: 3.3 (calc) (ref <5.0)
Triglycerides: 138 mg/dL (ref <150)

## 2017-05-21 NOTE — Progress Notes (Signed)
All labs are normal. 

## 2017-06-20 ENCOUNTER — Telehealth: Payer: Self-pay | Admitting: *Deleted

## 2017-06-20 NOTE — Telephone Encounter (Signed)
Form completed,faxed,confirmation received, and scanned.Dawn Griffin, [place]

## 2017-12-27 LAB — HM PAP SMEAR: HM Pap smear: NEGATIVE

## 2018-03-19 ENCOUNTER — Encounter: Payer: Self-pay | Admitting: Family Medicine

## 2018-06-22 ENCOUNTER — Encounter: Payer: Self-pay | Admitting: *Deleted

## 2018-06-23 ENCOUNTER — Ambulatory Visit (INDEPENDENT_AMBULATORY_CARE_PROVIDER_SITE_OTHER): Payer: Managed Care, Other (non HMO) | Admitting: Family Medicine

## 2018-06-23 ENCOUNTER — Encounter: Payer: Self-pay | Admitting: Family Medicine

## 2018-06-23 VITALS — Temp 97.6°F | Ht 64.0 in | Wt 135.0 lb

## 2018-06-23 DIAGNOSIS — M791 Myalgia, unspecified site: Secondary | ICD-10-CM

## 2018-06-23 DIAGNOSIS — G4700 Insomnia, unspecified: Secondary | ICD-10-CM

## 2018-06-23 DIAGNOSIS — R5383 Other fatigue: Secondary | ICD-10-CM | POA: Diagnosis not present

## 2018-06-23 DIAGNOSIS — Z1389 Encounter for screening for other disorder: Secondary | ICD-10-CM | POA: Diagnosis not present

## 2018-06-23 DIAGNOSIS — F329 Major depressive disorder, single episode, unspecified: Secondary | ICD-10-CM | POA: Diagnosis not present

## 2018-06-23 MED ORDER — DULOXETINE HCL 30 MG PO CPEP: ORAL_CAPSULE | ORAL | 3 refills | Status: DC

## 2018-06-23 NOTE — Progress Notes (Signed)
Pt reports that she spoke w/rheum about this and all of her labs have been nl.  He put her on gabapentin she has been on this x1 wk. She hasn't noticed a difference and understands that it may take a little more time.  She stated that her mother has Hx of MDD and her 2 sisters have depression. She reports that she also has been treated for this and because of what has been going on with her and she didn't feel that when she was on the medication that there was a difference.   She wanted to speak w/Dr. Madilyn Fireman about this to ge her thoughts.  I asked her about exercising and she said she and her husband prior to them moving they had a gym membership and once they moved the gym they were members at does

## 2018-06-23 NOTE — Progress Notes (Signed)
Virtual Visit via Video Note  I connected with Dawn Griffin on 06/23/18 at 10:30 AM EDT by a video enabled telemedicine application and verified that I am speaking with the correct person using two identifiers.   I discussed the limitations of evaluation and management by telemedicine and the availability of in person appointments. The patient expressed understanding and agreed to proceed.  Subjective:    CC: Fatigue -   HPI: 48 yo female c/o of fatigue and bodyaches for hte last several years. She was diagnosed with RA in about 2014.  She feels achey all over.  Her bones hurt.  She is having a hard time focusing for the last 6 months at least.  She has a hx of rheumatoid arthritis.  Talked with Dr. Barkley Boards,  Her Rheumatologist,  about it in March and he suggested it could  be fibromyalgia.  She had a day where she felt so exhausted she could barely get out of bed.  Saw Dr. Barkley Boards the next day.  Started on gabapentin to see if helps. So far hasn't seem to help her sxs.   No snoring or hx of sleep apnea.  Her RA is well controlled right now.  Was tried on celex earlier this year for mood and it didn't really help her.    She has had chronic sleep problems. Says she tosses and turns all night.  Says her hands and arms often fall asleep at night.  No fevers.   She does have a family history of depression.    She is just starting to feel like her quality of life is lacking and it is reallhy getting to her.  She just wants to feel normal again. She feels like it has really impacted her family as well and feels guilty about that.    Temp 97.6 F (36.4 C)   Ht 5\' 4"  (1.626 m)   Wt 135 lb (61.2 kg)   BMI 23.17 kg/m     No Known Allergies  No past medical history on file.  Past Surgical History:  Procedure Laterality Date  . LUMBAR SPINE SURGERY  2001   L4-5-S1  . TONSILLECTOMY  1990    Social History   Socioeconomic History  . Marital status: Married    Spouse name: Not on file  .  Number of children: Not on file  . Years of education: Not on file  . Highest education level: Not on file  Occupational History  . Not on file  Social Needs  . Financial resource strain: Not on file  . Food insecurity:    Worry: Not on file    Inability: Not on file  . Transportation needs:    Medical: Not on file    Non-medical: Not on file  Tobacco Use  . Smoking status: Current Every Day Smoker    Packs/day: 0.30    Types: Cigarettes  . Smokeless tobacco: Never Used  Substance and Sexual Activity  . Alcohol use: Yes    Alcohol/week: 1.0 standard drinks    Types: 1 Standard drinks or equivalent per week  . Drug use: No  . Sexual activity: Yes    Partners: Male  Lifestyle  . Physical activity:    Days per week: Not on file    Minutes per session: Not on file  . Stress: Not on file  Relationships  . Social connections:    Talks on phone: Not on file    Gets together: Not on file  Attends religious service: Not on file    Active member of club or organization: Not on file    Attends meetings of clubs or organizations: Not on file    Relationship status: Not on file  . Intimate partner violence:    Fear of current or ex partner: Not on file    Emotionally abused: Not on file    Physically abused: Not on file    Forced sexual activity: Not on file  Other Topics Concern  . Not on file  Social History Narrative   No exercise. + daily caffeine.     Family History  Problem Relation Age of Onset  . Stomach cancer Paternal Grandmother   . Bipolar disorder Mother     Outpatient Encounter Medications as of 06/23/2018  Medication Sig  . Ascorbic Acid (VITAMIN C) 1000 MG tablet Take 1,000 mg by mouth daily.  Marland Kitchen b complex vitamins tablet Take by mouth.  . Cats Claw, Uncaria tomentosa, (CATS CLAW PO) Take by mouth.  . folic acid (FOLVITE) 1 MG tablet   . gabapentin (NEURONTIN) 300 MG capsule Take 1 capsule by mouth daily with supper.  . methotrexate (RHEUMATREX) 2.5 MG  tablet once a week.   . Methylsulfonylmethane (MSM PO) Take by mouth.  . Multiple Vitamins-Minerals (MULTIVITAMIN ADULT PO) Take by mouth.  . Probiotic Product (PROBIOTIC DAILY PO) Take by mouth.  . TURMERIC PO Take by mouth.  . [DISCONTINUED] citalopram (CELEXA) 20 MG tablet    No facility-administered encounter medications on file as of 06/23/2018.          Past medical history, Surgical history, Family history not pertinant except as noted below, Social history, Allergies, and medications have been entered into the medical record, reviewed, and corrections made.   Review of Systems: No fevers, chills, night sweats, weight loss, chest pain, or shortness of breath.   Objective:    General: Speaking clearly in complete sentences without any shortness of breath.  Alert and oriented x3.  Normal judgment. No apparent acute distress. Well groomed.     Impression and Recommendations:   FAtigue - will check labs.  She gets routine CBC with her rhuem. Will check for deficiencies and check thhroid levels.  Discussed even the possibility of stimulant once we work on sleep.   Myalgias - suspect fibromyalgia. Will send her the fibro form to complete.  Explained it is a diagnosis of exclusion.   Insomnia - Will work further on sleep quality when we f/u in 7-10 days.    Depression - PHQ -9 score of 14 today.  Discussed options. Not sure if her physical symptoms are affecting her mood or her mood is causing physical symptoms but I would guess it is a combination of  Both. Discussed trial of cymbalta.  Warned about potential sedation between cymbalta and gabapentin.     I discussed the assessment and treatment plan with the patient. The patient was provided an opportunity to ask questions and all were answered. The patient agreed with the plan and demonstrated an understanding of the instructions.   The patient was advised to call back or seek an in-person evaluation if the symptoms worsen or if  the condition fails to improve as anticipated.   Beatrice Lecher, MD

## 2018-06-24 ENCOUNTER — Encounter: Payer: Self-pay | Admitting: Family Medicine

## 2018-06-24 NOTE — Telephone Encounter (Signed)
Will work on scanning and sending.

## 2018-06-29 LAB — CBC WITH DIFFERENTIAL/PLATELET
Absolute Monocytes: 346 cells/uL (ref 200–950)
Basophils Absolute: 43 cells/uL (ref 0–200)
Basophils Relative: 0.6 %
Eosinophils Absolute: 122 cells/uL (ref 15–500)
Eosinophils Relative: 1.7 %
HCT: 39.1 % (ref 35.0–45.0)
Hemoglobin: 13 g/dL (ref 11.7–15.5)
Lymphs Abs: 2556 cells/uL (ref 850–3900)
MCH: 30.7 pg (ref 27.0–33.0)
MCHC: 33.2 g/dL (ref 32.0–36.0)
MCV: 92.4 fL (ref 80.0–100.0)
MPV: 10.8 fL (ref 7.5–12.5)
Monocytes Relative: 4.8 %
Neutro Abs: 4133 cells/uL (ref 1500–7800)
Neutrophils Relative %: 57.4 %
Platelets: 247 10*3/uL (ref 140–400)
RBC: 4.23 10*6/uL (ref 3.80–5.10)
RDW: 12.3 % (ref 11.0–15.0)
Total Lymphocyte: 35.5 %
WBC: 7.2 10*3/uL (ref 3.8–10.8)

## 2018-06-29 LAB — SEDIMENTATION RATE: Sed Rate: 19 mm/h (ref 0–20)

## 2018-06-29 LAB — MAGNESIUM: Magnesium: 1.9 mg/dL (ref 1.5–2.5)

## 2018-06-29 LAB — COMPLETE METABOLIC PANEL WITH GFR
AG Ratio: 2 (calc) (ref 1.0–2.5)
ALT: 32 U/L — ABNORMAL HIGH (ref 6–29)
AST: 29 U/L (ref 10–35)
Albumin: 4.5 g/dL (ref 3.6–5.1)
Alkaline phosphatase (APISO): 41 U/L (ref 31–125)
BUN: 8 mg/dL (ref 7–25)
CO2: 29 mmol/L (ref 20–32)
Calcium: 9.8 mg/dL (ref 8.6–10.2)
Chloride: 103 mmol/L (ref 98–110)
Creat: 0.71 mg/dL (ref 0.50–1.10)
GFR, Est African American: 119 mL/min/{1.73_m2} (ref >=60)
GFR, Est Non African American: 103 mL/min/{1.73_m2} (ref >=60)
Globulin: 2.2 g/dL (calc) (ref 1.9–3.7)
Glucose, Bld: 88 mg/dL (ref 65–139)
Potassium: 4.3 mmol/L (ref 3.5–5.3)
Sodium: 136 mmol/L (ref 135–146)
Total Bilirubin: 0.5 mg/dL (ref 0.2–1.2)
Total Protein: 6.7 g/dL (ref 6.1–8.1)

## 2018-06-29 LAB — LUTEINIZING HORMONE: LH: 25.9 m[IU]/mL

## 2018-06-29 LAB — IRON,TIBC AND FERRITIN PANEL
%SAT: 53 % (calc) — ABNORMAL HIGH (ref 16–45)
Ferritin: 61 ng/mL (ref 16–232)
Iron: 157 ug/dL (ref 40–190)
TIBC: 295 mcg/dL (calc) (ref 250–450)

## 2018-06-29 LAB — URINALYSIS, ROUTINE W REFLEX MICROSCOPIC
Bilirubin Urine: NEGATIVE
Glucose, UA: NEGATIVE
Hgb urine dipstick: NEGATIVE
Ketones, ur: NEGATIVE
Leukocytes,Ua: NEGATIVE
Nitrite: NEGATIVE
Protein, ur: NEGATIVE
Specific Gravity, Urine: 1.013 (ref 1.001–1.03)
pH: 5.5 (ref 5.0–8.0)

## 2018-06-29 LAB — B12 AND FOLATE PANEL
Folate: >24 ng/mL
Vitamin B-12: 638 pg/mL (ref 200–1100)

## 2018-06-29 LAB — TSH: TSH: 0.89 mIU/L

## 2018-06-29 LAB — FOLLICLE STIMULATING HORMONE: FSH: 10.4 m[IU]/mL

## 2018-06-29 LAB — ESTRADIOL: Estradiol: 296 pg/mL

## 2018-06-29 LAB — PROGESTERONE: Progesterone: <0.5 ng/mL

## 2018-06-29 LAB — VITAMIN B6: Vitamin B6: 68.1 ng/mL — ABNORMAL HIGH (ref 2.1–21.7)

## 2018-06-29 LAB — CK: Total CK: 34 U/L (ref 29–143)

## 2018-06-30 ENCOUNTER — Telehealth (INDEPENDENT_AMBULATORY_CARE_PROVIDER_SITE_OTHER): Payer: Managed Care, Other (non HMO) | Admitting: Family Medicine

## 2018-06-30 ENCOUNTER — Encounter: Payer: Self-pay | Admitting: Family Medicine

## 2018-06-30 VITALS — Temp 98.5°F | Ht 64.0 in | Wt 130.0 lb

## 2018-06-30 DIAGNOSIS — R768 Other specified abnormal immunological findings in serum: Secondary | ICD-10-CM

## 2018-06-30 DIAGNOSIS — M0579 Rheumatoid arthritis with rheumatoid factor of multiple sites without organ or systems involvement: Secondary | ICD-10-CM

## 2018-06-30 DIAGNOSIS — R0602 Shortness of breath: Secondary | ICD-10-CM

## 2018-06-30 DIAGNOSIS — M7918 Myalgia, other site: Secondary | ICD-10-CM | POA: Insufficient documentation

## 2018-06-30 DIAGNOSIS — I7300 Raynaud's syndrome without gangrene: Secondary | ICD-10-CM

## 2018-06-30 NOTE — Progress Notes (Signed)
Virtual Visit via Video Note  I connected with Dawn Griffin on 06/30/18 at  9:30 AM EDT by a video enabled telemedicine application and verified that I am speaking with the correct person using two identifiers.   I discussed the limitations of evaluation and management by telemedicine and the availability of in person appointments. The patient expressed understanding and agreed to proceed.  Subjective:    CC: Myalgias.   HPI:  48 yo female prsent for one week follow up.  Please see previous note with c/o severe fatigue, bodyaches for the last several years. Has been getting worse.  She feels like it has greatly impacted her quality of life.  She has dx of RA since 2014 and is followed by Dr. Barkley Boards. Also s/o of difficulty focusing and more recently depressed mood. + sleep prpblems.  Recently started on gabapentin.  Last PHQ- 9 score of 14.  She has been on the cymbalta 30mg  for 7 days. She doesn't feel as groggy headed. Gets easily SOb with activity. Head will pound with activity as well.   We decided to have her complete fibromyalgia screening questionnaire.  Also started her low dose of Cymbalta and had her go for labwork. So far hasn't had any side effects on Cymbalta and feels maybe a little mentally for clear. Says she had a rough weekend where she felt like she didn't want to get off the cough. Hard to get up physically and get motivated.   Has had some broken blood vessels on her face as well.    She wonders if her dx of RA is correct. Dr. Barkley Boards had suggested at one point that he was considering scleroderma in her.  She jut feels like the MTX isn't working. She still gets swelling in her hands, pain and stiffness.      Past medical history, Surgical history, Family history not pertinant except as noted below, Social history, Allergies, and medications have been entered into the medical record, reviewed, and corrections made.   Review of Systems: No fevers, chills, night sweats,  weight loss, chest pain, or shortness of breath.   Objective:    General: Speaking clearly in complete sentences without any shortness of breath.  Alert and oriented x3.  Normal judgment. No apparent acute distress.    Impression and Recommendations:    Myalgias/myofascial pain - Review results of labs and questionnaire. I do suspect she has fibromyalgia on top of her autoimmune disease. Her Widespread Pain Index score is 10 and her Symptom Severity score is 10 which meets criteria for fibro. Labs are essentially normal with 2 labs still pending.    RA- I do think she has RA vs another autoimmune disease.  MTX x 2 years without significant improvement in symptoms. Would like to refer to Dr. Jerene Pitch for 2nd opinion on her meds and diagnosis.    SOB w/ activity - consider further work up with spirometry and echo once the stay at home order has been removed.     I discussed the assessment and treatment plan with the patient. The patient was provided an opportunity to ask questions and all were answered. The patient agreed with the plan and demonstrated an understanding of the instructions.   The patient was advised to call back or seek an in-person evaluation if the symptoms worsen or if the condition fails to improve as anticipated.   Beatrice Lecher, MD

## 2018-06-30 NOTE — Progress Notes (Signed)
Not as foggy headed. She said her body feels like she just exercised too hard Pt said that she is has not started taking the 60 mg of the Cymbalta. When she went to go p/u the medication it was in capsules so she was not able to split the dosage to taper up for the first 6 days. So she has continued to take the 30 mg and has not tried to take 2 capsules because this was not written on the bottle. Dawn Kitchen Maryruth Griffin, Dawn Griffin, CMA

## 2018-07-01 LAB — HEAVY METALS PROFILE, URINE
Arsenic, 24H Ur: <10 mcg/L (ref <=80)
Lead, 24 hr urine: <10 mcg/L (ref <80)
Mercury, 24H Ur: <4 mcg/L (ref <=20)

## 2018-07-01 LAB — CORTISOL-AM, BLOOD: Cortisol - AM: 17.1 ug/dL

## 2018-07-08 ENCOUNTER — Encounter: Payer: Self-pay | Admitting: Family Medicine

## 2018-07-08 MED ORDER — DULOXETINE HCL 60 MG PO CPEP: 60.0000 mg | ORAL_CAPSULE | Freq: Every day | ORAL | 0 refills | Status: DC

## 2018-07-08 NOTE — Telephone Encounter (Signed)
Med sent.

## 2018-09-25 ENCOUNTER — Other Ambulatory Visit: Payer: Self-pay | Admitting: Family Medicine

## 2019-01-27 LAB — HM PAP SMEAR: HM Pap smear: NEGATIVE

## 2019-01-27 LAB — RESULTS CONSOLE HPV: CHL HPV: NEGATIVE

## 2019-03-12 HISTORY — PX: FINGER ARTHROSCOPY WITH CARPOMETACARPEL (CMC) ARTHROPLASTY: SHX5629

## 2019-03-19 ENCOUNTER — Ambulatory Visit (INDEPENDENT_AMBULATORY_CARE_PROVIDER_SITE_OTHER): Payer: Managed Care, Other (non HMO) | Admitting: Sports Medicine

## 2019-03-19 ENCOUNTER — Other Ambulatory Visit: Payer: Self-pay

## 2019-03-19 ENCOUNTER — Ambulatory Visit (INDEPENDENT_AMBULATORY_CARE_PROVIDER_SITE_OTHER): Payer: Managed Care, Other (non HMO)

## 2019-03-19 DIAGNOSIS — M0579 Rheumatoid arthritis with rheumatoid factor of multiple sites without organ or systems involvement: Secondary | ICD-10-CM

## 2019-03-19 DIAGNOSIS — M19049 Primary osteoarthritis, unspecified hand: Secondary | ICD-10-CM | POA: Diagnosis not present

## 2019-03-19 NOTE — Progress Notes (Signed)
    Procedures performed today:    None.  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  Positive ANA based on my review of prior tests, but negative CCP and rheumatoid factor.  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 Historically her rheumatoid arthritis has not been well controlled but over the past few months she has been on Enbrel and things have improved. Over the past few years she has noted progressive loss of range of motion at her left thumb MCP, as well as progressive full appearance at the [facility] joint. X-rays do show proximal subluxation of the first metacarpal to the level of the scaphoid. This is not reducible on exam, I would like a second opinion from hand surgery, I would also like hand physical therapy, but ultimately she feels as though her function is adequate. For this reason I do not think operative intervention is going to be indicated here.    ___________________________________________ Gwen Her. Dianah Field, M.D., ABFM., CAQSM. Primary Care and [place] Instructor of [employer] [facility]

## 2019-03-19 NOTE — Assessment & Plan Note (Addendum)
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 Historically her rheumatoid arthritis has not been well controlled but over the past few months she has been on Enbrel and things have improved. Over the past few years she has noted progressive loss of range of motion at her left thumb MCP, as well as progressive full appearance at the [facility] joint. X-rays do show proximal subluxation of the first metacarpal to the level of the scaphoid. This is not reducible on exam, I would like a second opinion from hand surgery, I would also like hand physical therapy, but ultimately she feels as though her function is adequate. For this reason I do not think operative intervention is going to be indicated here.

## 2019-03-22 ENCOUNTER — Other Ambulatory Visit: Payer: Self-pay

## 2019-03-22 ENCOUNTER — Ambulatory Visit (INDEPENDENT_AMBULATORY_CARE_PROVIDER_SITE_OTHER): Payer: Managed Care, Other (non HMO) | Admitting: Rehabilitative and Restorative Service Providers"

## 2019-03-22 ENCOUNTER — Encounter: Payer: Self-pay | Admitting: Rehabilitative and Restorative Service Providers"

## 2019-03-22 DIAGNOSIS — M25542 Pain in joints of left hand: Secondary | ICD-10-CM

## 2019-03-22 DIAGNOSIS — M6281 Muscle weakness (generalized): Secondary | ICD-10-CM | POA: Diagnosis not present

## 2019-03-22 NOTE — Patient Instructions (Signed)
Access Code: [email or web address]  URL: [email or web address]  Date: 03/22/2019  Prepared by: Rudell Cobb   Exercises Thumb MP Flexion Extension - 10 reps - 1 sets - 2x daily - 7x weekly Seated Carpal Tunnel Tissue Massage - 10 reps - 1 sets - 2x daily                            - 7x weekly

## 2019-03-22 NOTE — Therapy (Addendum)
[place] [place], [place], [address] Phone: [phone]   Fax:  [phone]  Physical Therapy Evaluation and Discharge Summary  Patient Details  Name: Dawn Griffin MRN: [id number] Date of Birth: 12/06/1970 Referring Provider (PT): Silverio Decamp, MD   Encounter Date: 03/22/2019  PT End of Session - 03/22/19 1914    Visit Number  1    Number of Visits  4    Date for PT Re-Evaluation  05/06/19    Authorization Type  Cigna/Cigna managed    PT Start Time  1016    PT Stop Time  1100    PT Time Calculation (min)  44 min    Activity Tolerance  Patient tolerated treatment well    Behavior During Therapy  [facility] for tasks assessed/performed       History reviewed. No pertinent past medical history.  Past Surgical History:  Procedure Laterality Date  . LUMBAR SPINE SURGERY  2001   L4-5-S1  . TONSILLECTOMY  1990    There were no vitals filed for this visit.   Subjective Assessment - 03/22/19 1021    Subjective  The patient was diagnosed with RA 3 years ago, but symptoms began 6 years ago.  She has had ongoing, chronic pain in the left thumb with h/o multiple injections.  She has a subluxed thumb on the L hand per imaging.    Pertinent History  rheumatoid arthritis    Limitations  Lifting    Patient Stated Goals  Be able to improve use of the left thumb    Currently in Pain?  Yes    Pain Score  --   "it's not going to kill me."   Pain Location  Hand    Pain Orientation  Left    Pain Descriptors / Indicators  Numbness;Sore   numbness in PIP joing of thumb   Pain Type  Chronic pain    Pain Onset  More than a month ago    Pain Frequency  Intermittent    Aggravating Factors   use of hand    Pain Relieving Factors  rest, heat         [facility] PT Assessment - 03/22/19 1028      Assessment   Medical Diagnosis  rheumatoid arthritis    Referring Provider (PT)  Silverio Decamp, MD    Onset  Date/Surgical Date  03/19/19    Hand Dominance  Right    Prior Therapy  none      Precautions   Precautions  Other (comment)    Precaution Comments  RA      Restrictions   Weight Bearing Restrictions  No      Balance Screen   Has the patient fallen in the past 6 months  No    Has the patient had a decrease in activity level because of a fear of falling?   No    Is the patient reluctant to leave their home because of a fear of falling?   No      Home Film/video editor residence      Prior Function   Level of Independence  Independent    Vocation  Full time employment    Vocation Requirements  computer work      Observation/Other Assessments   Focus on Therapeutic Outcomes (FOTO)   ad      Sensation   Light Touch  Appears Intact    Additional  Comments  some sensory changes with raynaud's      Posture/Postural Control   Posture Comments  L hand posture/positioning:  MCP joint anteriorly displaced with subluxed position of trapezium and scaphoid bones.        ROM / Strength   AROM / PROM / Strength  AROM;Strength      AROM   Overall AROM   Deficits    Overall AROM Comments  R thumb MCP = 110 degrees; L thumb MCP= 0 degrees isolated flexion.  R and L IP joint 90 degrees flexion.      AROM Assessment Site  Wrist;Thumb;Finger    Right/Left Wrist  Left   equal and symmetric bilaterally   Right/Left Finger  --    Right/Left Thumb  Right;Left    Left Thumb Opposition  Digit 2;Digit 3;Digit 5;Digit 4   able to oppose with significant pain     Strength   Overall Strength  Deficits    Overall Strength Comments  Pincer grasp 9 lbs R and 0 lbs L (unable to generate force)   L thumb cannot list against gravity.  Attempted isometric thumb pincer grip, however notable carpal dislocation    Strength Assessment Site  Hand    Right/Left hand  --      Palpation   Palpation comment  tender on the dorsal aspect of the thumb over MCP and IP joint; trapezium and  scaphoid are hypermobile.                  Objective measurements completed on examination: See above findings.              PT Education - 03/22/19 1913    Education Details  HEP;    Person(s) Educated  Patient    Methods  Explanation;Demonstration;Handout    Comprehension  Returned demonstration;Verbalized understanding          PT Long Term Goals - 03/22/19 1915      PT LONG TERM GOAL #1   Title  The patient will be independent with HEP for ROM and strength.    Time  6    Period  Weeks    Target Date  05/06/19      PT LONG TERM GOAL #2   Title  PT to address thumb instability with splinting options.    Time  6    Period  Weeks    Target Date  05/06/19             Plan - 03/22/19 1947    Clinical Impression Statement  The patient is a 49 year old female presenting to OP physical therapy with decreased use of her left thumb due to carpal subluxation, joint changes from RA.  She experiences L thumb pain, is unable to generate force through the left thumb for functional tasks of stabilizing a jar to open with the right hand, and she is unable to grasp a cup with the left hand.  During attempted isometric contractions of the thumb, there is visible dislocation of the carpal bones volarly and proximal.  Therefore, PT and patient discussed our focus of providing joint ROM to maintain hand positioning and considering a thumb MCP brace for proximal stability (PT to research brace otpions).    Personal Factors and Comorbidities  Comorbidity 1    Comorbidities  rheumatoid arthritis    Examination-Activity Limitations  Lift;Carry    Examination-Participation Restrictions  Cleaning    Stability/Clinical Decision Making  Stable/Uncomplicated  Clinical Decision Making  Low    Rehab Potential  Good   for established goals; PT focus is for positioning and ROM only due to worsening subluxation with contraction   PT Frequency  1x / week    PT Duration  4 weeks     PT Treatment/Interventions  ADLs/Self Care Home Management;Cryotherapy;Iontophoresis 4mg /ml Dexamethasone;Ultrasound;Therapeutic exercise;Therapeutic activities;Patient/family education;Manual techniques;Taping;Splinting    PT Next Visit Plan  discuss bracing options and review other ROM for hand function; look at compensatory ways to compensate for jar opening and grasp of cups    Consulted and Agree with Plan of Care  Patient       Patient will benefit from skilled therapeutic intervention in order to improve the following deficits and impairments:  Pain, Decreased strength, Decreased range of motion, Impaired UE functional use  Visit Diagnosis: Pain in joints of left hand  Muscle weakness (generalized)     Problem List Patient Active Problem List   Diagnosis Date Noted  . Myofascial pain 06/30/2018  . Rheumatoid arthritis ([place]) 05/20/2017  . Bilateral hand numbness 08/17/2013  . 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 SOB (shortness of breath) 02/26/2013  . Positive ANA (antinuclear antibody) 01/28/2013  . Raynaud's phenomenon 01/28/2013  . RAYNAUD'S DISEASE 01/10/2010  . ANEMIA, HX OF 01/10/2010  . URINARY URGENCY 12/21/2009  . LYMPHADENOPATHY 08/15/2008     Patient did not return to PT.  Please refer to initial evaluation for patient status. Thank you for the referral of this patient. Rudell Cobb, MPT    [place], PT 03/22/2019, 7:55 PM  [address] [place], [place], [address] Phone: [phone]   Fax:  [phone]  Name: Dawn Griffin MRN: [id number] Date of Birth: 12-29-1970

## 2019-03-23 ENCOUNTER — Encounter: Payer: Self-pay | Admitting: Rehabilitative and Restorative Service Providers"

## 2019-04-05 ENCOUNTER — Encounter: Payer: Managed Care, Other (non HMO) | Admitting: Rehabilitative and Restorative Service Providers"

## 2019-05-10 ENCOUNTER — Encounter: Payer: Self-pay | Admitting: Family Medicine

## 2019-05-10 ENCOUNTER — Telehealth (INDEPENDENT_AMBULATORY_CARE_PROVIDER_SITE_OTHER): Payer: Managed Care, Other (non HMO) | Admitting: Family Medicine

## 2019-05-10 VITALS — Temp 98.1°F | Ht 64.0 in | Wt 130.0 lb

## 2019-05-10 DIAGNOSIS — M0579 Rheumatoid arthritis with rheumatoid factor of multiple sites without organ or systems involvement: Secondary | ICD-10-CM

## 2019-05-10 DIAGNOSIS — F488 Other specified nonpsychotic mental disorders: Secondary | ICD-10-CM | POA: Diagnosis not present

## 2019-05-10 DIAGNOSIS — R4189 Other symptoms and signs involving cognitive functions and awareness: Secondary | ICD-10-CM | POA: Insufficient documentation

## 2019-05-10 DIAGNOSIS — R5382 Chronic fatigue, unspecified: Secondary | ICD-10-CM | POA: Diagnosis not present

## 2019-05-10 MED ORDER — AMPHETAMINE-DEXTROAMPHET ER 10 MG PO CP24: 10.0000 mg | ORAL_CAPSULE | Freq: Every day | ORAL | 0 refills | Status: DC

## 2019-05-10 NOTE — Assessment & Plan Note (Signed)
Trial of stimulant medication to see if this helps her with function.  Warned her of potential side effects and when to stop the medication.

## 2019-05-10 NOTE — Progress Notes (Signed)
She has started taking Embrel and thought this would help. She has a f/u with rheumatology on Friday. Was told that they would wean her off of the methotrexate.  She stated that the last time she came in about this there was a discussion about starting her on something that might help her with the fatigue and she is interested in possibly starting medication for this.

## 2019-05-10 NOTE — Progress Notes (Signed)
Virtual Visit via Video Note  I connected with Dawn Griffin on 05/10/19 at  1:20 PM EST by a video enabled telemedicine application and verified that I am speaking with the correct person using two identifiers.   I discussed the limitations of evaluation and management by telemedicine and the availability of in person appointments. The patient expressed understanding and agreed to proceed.  Subjective:    CC: Fatigue  HPI: She has started taking Embrel and thought this would help but unfortunately she has not really noticed a big change.. She has a f/u with rheumatology on Friday. Was told that they would wean her off of the methotrexate, but right now she is still on it.  Her biggest issue is that she still just really struggling with the fatigue.  It makes it really hard to get through her day she sometimes feels foggy brain.  She says the best she felt was when she was on prednisone for a short time when she first saw Dr. Jerene Pitch.  She said she had tons of energy and actually felt great..  She stated that the last time she came in about this there was a discussion about starting her on something that might help her with the fatigue and she is interested in possibly starting medication for this.    Past medical history, Surgical history, Family history not pertinant except as noted below, Social history, Allergies, and medications have been entered into the medical record, reviewed, and corrections made.   Review of Systems: No fevers, chills, night sweats, weight loss, chest pain, or shortness of breath.   Objective:    General: Speaking clearly in complete sentences without any shortness of breath.  Alert and oriented x3.  Normal judgment. No apparent acute distress.    Impression and Recommendations:    Chronic fatigue I was really hoping that the treatments for her rheumatoid would really improve her fatigue and brain fog.  Unfortunately it has not.  She is still taking Cymbalta  which is helpful.  We had discussed that there were some potential options in the future depending on how she did with the medications.  We will go ahead and move forward with a trial of a stimulant.  Did warn about potential complications including increased blood pressure, palpitations etc.  We will need to monitor these things very carefully.  Also discussed potential for insomnia etc.  Plan to go ahead and start medication and follow-up in about 3 to 4 weeks.  Brain fog Trial of stimulant medication to see if this helps her with function.  Warned her of potential side effects and when to stop the medication.  Rheumatoid arthritis ([place]) Has follow-up on Friday to discuss further treatment options.      Time spent in encounter 20 minutes  I discussed the assessment and treatment plan with the patient. The patient was provided an opportunity to ask questions and all were answered. The patient agreed with the plan and demonstrated an understanding of the instructions.   The patient was advised to call back or seek an in-person evaluation if the symptoms worsen or if the condition fails to improve as anticipated.   Beatrice Lecher, MD

## 2019-05-10 NOTE — Assessment & Plan Note (Signed)
I was really hoping that the treatments for her rheumatoid would really improve her fatigue and brain fog.  Unfortunately it has not.  She is still taking Cymbalta which is helpful.  We had discussed that there were some potential options in the future depending on how she did with the medications.  We will go ahead and move forward with a trial of a stimulant.  Did warn about potential complications including increased blood pressure, palpitations etc.  We will need to monitor these things very carefully.  Also discussed potential for insomnia etc.  Plan to go ahead and start medication and follow-up in about 3 to 4 weeks.

## 2019-05-10 NOTE — Assessment & Plan Note (Signed)
Has follow-up on Friday to discuss further treatment options.

## 2019-05-24 ENCOUNTER — Ambulatory Visit (INDEPENDENT_AMBULATORY_CARE_PROVIDER_SITE_OTHER): Payer: Managed Care, Other (non HMO) | Admitting: Family Medicine

## 2019-05-24 ENCOUNTER — Encounter: Payer: Self-pay | Admitting: Family Medicine

## 2019-05-24 ENCOUNTER — Other Ambulatory Visit: Payer: Self-pay

## 2019-05-24 VITALS — BP 112/48 | HR 90 | Ht 64.0 in | Wt 134.0 lb

## 2019-05-24 DIAGNOSIS — Z0000 Encounter for general adult medical examination without abnormal findings: Secondary | ICD-10-CM

## 2019-05-24 DIAGNOSIS — Z1239 Encounter for other screening for malignant neoplasm of breast: Secondary | ICD-10-CM | POA: Diagnosis not present

## 2019-05-24 DIAGNOSIS — R5382 Chronic fatigue, unspecified: Secondary | ICD-10-CM | POA: Diagnosis not present

## 2019-05-24 MED ORDER — AMPHETAMINE-DEXTROAMPHET ER 15 MG PO CP24: 15.0000 mg | ORAL_CAPSULE | Freq: Every day | ORAL | 0 refills | Status: DC

## 2019-05-24 NOTE — Progress Notes (Signed)
Subjective:     Dawn Griffin is a 49 y.o. female and is here for a comprehensive physical exam. The patient reports problems - Follows with rheumatology for rheumatoid arthritis.   Recently we had started Adderall for chronic fatigue.  Her rheumatologist did also recommend that she move her Cymbalta not to nighttime.  So far she is actually doing really well on the Adderall she feels like it has been helpful but says she definitely feels like she crashes when it starts to wear off.  She says she really needs the most effect from about noon until 7 PM and would like to try moving it to taking it around lunchtime and maybe even increasing the dose slightly she denied any chest pain, shortness of breath, palpitations flutters or jitteriness.  No significant change in weight.  It has not affected her sleep quality which is normally not right to begin with.  She continues to have chronic swelling in her hands.  And her fourth digit on her right hand at the tip she has permanent coolness and paleness of the fingertip at constantly looks purple.  She does have a history of Raynaud's.  She also gets petechiae over her fingertips and her face.  Pap smear and mammogram are up-to-date.  Social History   Socioeconomic History  . Marital status: Married    Spouse name: Not on file  . Number of children: Not on file  . Years of education: Not on file  . Highest education level: Not on file  Occupational History  . Not on file  Tobacco Use  . Smoking status: Current Every Day Smoker    Packs/day: 0.30    Types: Cigarettes  . Smokeless tobacco: Never Used  Substance and Sexual Activity  . Alcohol use: Yes    Alcohol/week: 1.0 standard drinks    Types: 1 Standard drinks or equivalent per week  . Drug use: No  . Sexual activity: Yes    Partners: Male  Other Topics Concern  . Not on file  Social History Narrative   No exercise. + daily caffeine.    Social Determinants of Health   Financial  Resource Strain:   . Difficulty of Paying Living Expenses:   Food Insecurity:   . Worried About Charity fundraiser in the Last Year:   . Arboriculturist in the Last Year:   Transportation Needs:   . Film/video editor (Medical):   Marland Kitchen Lack of Transportation (Non-Medical):   Physical Activity:   . Days of Exercise per Week:   . Minutes of Exercise per Session:   Stress:   . Feeling of Stress :   Social Connections:   . Frequency of Communication with Friends and Family:   . Frequency of Social Gatherings with Friends and Family:   . Attends Religious Services:   . Active Member of Clubs or Organizations:   . Attends Archivist Meetings:   Marland Kitchen Marital Status:   Intimate Partner Violence:   . Fear of Current or Ex-Partner:   . Emotionally Abused:   Marland Kitchen Physically Abused:   . Sexually Abused:    Health Maintenance  Topic Date Due  . HIV Screening  06/30/2019 (Originally 10/28/1985)  . MAMMOGRAM  01/10/2020 (Originally 05/28/2017)  . COLONOSCOPY  05/10/2022 (Originally 03/07/2018)  . INFLUENZA VACCINE  06/09/2023 (Originally 10/10/2018)  . PAP SMEAR-Modifier  12/27/2020  . TETANUS/TDAP  01/12/2023    The following portions of the patient's history were reviewed and  updated as appropriate: allergies, current medications, past family history, past medical history, past social history, past surgical history and problem list.  Review of Systems A comprehensive review of systems was negative.   Objective:    BP (!) 112/48   Pulse 90   Ht 5\' 4"  (1.626 m)   Wt 134 lb (60.8 kg)   SpO2 100%   BMI 23.00 kg/m  General appearance: alert, cooperative and appears stated age Head: Normocephalic, without obvious abnormality, atraumatic Eyes: conj clear, EOMI, PEERLA Ears: normal TM's and external ear canals both ears Nose: Nares normal. Septum midline. Mucosa normal. No drainage or sinus tenderness. Throat: lips, mucosa, and tongue normal; teeth and gums normal Neck: no  adenopathy, no carotid bruit, no JVD, supple, symmetrical, trachea midline and thyroid not enlarged, symmetric, no tenderness/mass/nodules Back: symmetric, no curvature. ROM normal. No CVA tenderness. Lungs: clear to auscultation bilaterally Heart: regular rate and rhythm, S1, S2 normal, no murmur, click, rub or gallop Abdomen: soft, non-tender; bowel sounds normal; no masses,  no organomegaly Extremities: extremities normal, atraumatic, no cyanosis or edema Pulses: 2+ and symmetric Skin: Skin color, texture, turgor normal. No rashes or lesions Lymph nodes: Supraclavicular adenopathy: nl Neurologic: Alert and oriented X 3, normal strength and tone. Normal symmetric reflexes. Normal coordination and gait    Assessment:    Healthy female exam.      Plan:     See After Visit Summary for Counseling Recommendations   Keep up a regular exercise program and make sure you are eating a healthy diet Try to eat 4 servings of dairy a day, or if you are lactose intolerant take a calcium with vitamin D daily.  Your vaccines are up to date.  Labs are up-to-date with her rheumatologist but we do need to get an up-to-date lipid panel.  Chronic Fatigue/brain fog-has noticed significant improvement with the addition/new start of Adderall.  Discussed options. Will try increasing Adderral 15mg  XR.  Okay to move later into the day but did warn about the fact that it does need to wear off early enough that its not impacting her sleep quality and just encouraged her to really keep a good eye on that.  We will also increase her dose so need to monitor for any chest pain, shortness of breath, palpitations and jitteriness etc.  Plan to follow-up either virtually or in person whichever she prefers in about 4 to 6 weeks.  We did discuss keeping the duloxetine in place for mood and for myofascial pain at least until we get the Adderall adjusted and at that point if she is doing really well and would like to try coming  off of it I think it would be reasonable.

## 2019-05-24 NOTE — Patient Instructions (Signed)
Health Maintenance, Female Adopting a healthy lifestyle and getting preventive care are important in promoting health and wellness. Ask your health care provider about:  The right schedule for you to have regular tests and exams.  Things you can do on your own to prevent diseases and keep yourself healthy. What should I know about diet, weight, and exercise? Eat a healthy diet   Eat a diet that includes plenty of vegetables, fruits, low-fat dairy products, and lean protein.  Do not eat a lot of foods that are high in solid fats, added sugars, or sodium. Maintain a healthy weight Body mass index (BMI) is used to identify weight problems. It estimates body fat based on height and weight. Your health care provider can help determine your BMI and help you achieve or maintain a healthy weight. Get regular exercise Get regular exercise. This is one of the most important things you can do for your health. Most adults should:  Exercise for at least 150 minutes each week. The exercise should increase your heart rate and make you sweat (moderate-intensity exercise).  Do strengthening exercises at least twice a week. This is in addition to the moderate-intensity exercise.  Spend less time sitting. Even light physical activity can be beneficial. Watch cholesterol and blood lipids Have your blood tested for lipids and cholesterol at 47 years of age, then have this test every 5 years. Have your cholesterol levels checked more often if:  Your lipid or cholesterol levels are high.  You are older than 47 years of age.  You are at high risk for heart disease. What should I know about cancer screening? Depending on your health history and family history, you may need to have cancer screening at various ages. This may include screening for:  Breast cancer.  Cervical cancer.  Colorectal cancer.  Skin cancer.  Lung cancer. What should I know about heart disease, diabetes, and high blood  pressure? Blood pressure and heart disease  High blood pressure causes heart disease and increases the risk of stroke. This is more likely to develop in people who have high blood pressure readings, are of African descent, or are overweight.  Have your blood pressure checked: ? Every 3-5 years if you are 18-39 years of age. ? Every year if you are 40 years old or older. Diabetes Have regular diabetes screenings. This checks your fasting blood sugar level. Have the screening done:  Once every three years after age 40 if you are at a normal weight and have a low risk for diabetes.  More often and at a younger age if you are overweight or have a high risk for diabetes. What should I know about preventing infection? Hepatitis B If you have a higher risk for hepatitis B, you should be screened for this virus. Talk with your health care provider to find out if you are at risk for hepatitis B infection. Hepatitis C Testing is recommended for:  Everyone born from 1945 through 1965.  Anyone with known risk factors for hepatitis C. Sexually transmitted infections (STIs)  Get screened for STIs, including gonorrhea and chlamydia, if: ? You are sexually active and are younger than 47 years of age. ? You are older than 47 years of age and your health care provider tells you that you are at risk for this type of infection. ? Your sexual activity has changed since you were last screened, and you are at increased risk for chlamydia or gonorrhea. Ask your health care provider if   you are at risk.  Ask your health care provider about whether you are at high risk for HIV. Your health care provider may recommend a prescription medicine to help prevent HIV infection. If you choose to take medicine to prevent HIV, you should first get tested for HIV. You should then be tested every 3 months for as long as you are taking the medicine. Pregnancy  If you are about to stop having your period (premenopausal) and  you may become pregnant, seek counseling before you get pregnant.  Take 400 to 800 micrograms (mcg) of folic acid every day if you become pregnant.  Ask for birth control (contraception) if you want to prevent pregnancy. Osteoporosis and menopause Osteoporosis is a disease in which the bones lose minerals and strength with aging. This can result in bone fractures. If you are 65 years old or older, or if you are at risk for osteoporosis and fractures, ask your health care provider if you should:  Be screened for bone loss.  Take a calcium or vitamin D supplement to lower your risk of fractures.  Be given hormone replacement therapy (HRT) to treat symptoms of menopause. Follow these instructions at home: Lifestyle  Do not use any products that contain nicotine or tobacco, such as cigarettes, e-cigarettes, and chewing tobacco. If you need help quitting, ask your health care provider.  Do not use street drugs.  Do not share needles.  Ask your health care provider for help if you need support or information about quitting drugs. Alcohol use  Do not drink alcohol if: ? Your health care provider tells you not to drink. ? You are pregnant, may be pregnant, or are planning to become pregnant.  If you drink alcohol: ? Limit how much you use to 0-1 drink a day. ? Limit intake if you are breastfeeding.  Be aware of how much alcohol is in your drink. In the [place] one drink equals one 12 oz bottle of beer (355 mL), one 5 oz glass of wine (148 mL), or one 1 oz glass of hard liquor (44 mL). General instructions  Schedule regular health, dental, and eye exams.  Stay current with your vaccines.  Tell your health care provider if: ? You often feel depressed. ? You have ever been abused or do not feel safe at home. Summary  Adopting a healthy lifestyle and getting preventive care are important in promoting health and wellness.  Follow your health care provider's instructions about healthy  diet, exercising, and getting tested or screened for diseases.  Follow your health care provider's instructions on monitoring your cholesterol and blood pressure. This information is not intended to replace advice given to you by your health care provider. Make sure you discuss any questions you have with your health care provider. Document Revised: 02/18/2018 Document Reviewed: 02/18/2018 Elsevier Patient Education  2020 [employer]  

## 2019-05-27 ENCOUNTER — Other Ambulatory Visit: Payer: Self-pay | Admitting: Family Medicine

## 2019-05-27 ENCOUNTER — Ambulatory Visit (INDEPENDENT_AMBULATORY_CARE_PROVIDER_SITE_OTHER): Payer: Managed Care, Other (non HMO)

## 2019-05-27 ENCOUNTER — Other Ambulatory Visit: Payer: Self-pay

## 2019-05-27 DIAGNOSIS — Z1239 Encounter for other screening for malignant neoplasm of breast: Secondary | ICD-10-CM | POA: Diagnosis not present

## 2019-05-27 DIAGNOSIS — R928 Other abnormal and inconclusive findings on diagnostic imaging of breast: Secondary | ICD-10-CM

## 2019-05-28 LAB — LIPID PANEL
Cholesterol: 177 mg/dL (ref <200)
HDL: 59 mg/dL (ref >=50)
LDL Cholesterol (Calc): 91 mg/dL (calc)
Non-HDL Cholesterol (Calc): 118 mg/dL (calc) (ref <130)
Total CHOL/HDL Ratio: 3 (calc) (ref <5.0)
Triglycerides: 171 mg/dL — ABNORMAL HIGH (ref <150)

## 2019-06-07 ENCOUNTER — Encounter: Payer: Self-pay | Admitting: Family Medicine

## 2019-06-07 ENCOUNTER — Other Ambulatory Visit: Payer: Self-pay | Admitting: Family Medicine

## 2019-06-07 NOTE — Telephone Encounter (Signed)
Denied adderall request- last sent 3/15

## 2019-06-15 ENCOUNTER — Other Ambulatory Visit: Payer: Self-pay

## 2019-06-15 ENCOUNTER — Ambulatory Visit
Admission: RE | Admit: 2019-06-15 | Discharge: 2019-06-15 | Disposition: A | Discharge status: Home / Self Care | Payer: Managed Care, Other (non HMO) | Source: Ambulatory Visit | Attending: Family Medicine | Admitting: Family Medicine

## 2019-06-15 ENCOUNTER — Other Ambulatory Visit: Payer: Self-pay | Admitting: Family Medicine

## 2019-06-15 DIAGNOSIS — N6320 Unspecified lump in the left breast, unspecified quadrant: Secondary | ICD-10-CM

## 2019-06-15 DIAGNOSIS — R928 Other abnormal and inconclusive findings on diagnostic imaging of breast: Secondary | ICD-10-CM

## 2019-06-21 ENCOUNTER — Telehealth (INDEPENDENT_AMBULATORY_CARE_PROVIDER_SITE_OTHER): Payer: Managed Care, Other (non HMO) | Admitting: Family Medicine

## 2019-06-21 ENCOUNTER — Encounter: Payer: Self-pay | Admitting: Family Medicine

## 2019-06-21 VITALS — Temp 98.4°F | Wt 131.0 lb

## 2019-06-21 DIAGNOSIS — F488 Other specified nonpsychotic mental disorders: Secondary | ICD-10-CM | POA: Diagnosis not present

## 2019-06-21 DIAGNOSIS — R4189 Other symptoms and signs involving cognitive functions and awareness: Secondary | ICD-10-CM

## 2019-06-21 DIAGNOSIS — R5382 Chronic fatigue, unspecified: Secondary | ICD-10-CM | POA: Diagnosis not present

## 2019-06-21 MED ORDER — AMPHETAMINE-DEXTROAMPHET ER 15 MG PO CP24: 15.0000 mg | ORAL_CAPSULE | Freq: Every day | ORAL | 0 refills | Status: DC

## 2019-06-21 MED ORDER — AMPHETAMINE-DEXTROAMPHETAMINE 10 MG PO TABS: 10.0000 mg | ORAL_TABLET | Freq: Every day | ORAL | 0 refills | Status: DC

## 2019-06-21 NOTE — Progress Notes (Signed)
Pt reports that she is doing well on current regimen of Adderall.

## 2019-06-21 NOTE — Progress Notes (Signed)
Virtual Visit via Video Note  I connected with Dawn Griffin on 06/21/19 at 11:10 AM EDT by a video enabled telemedicine application and verified that I am speaking with the correct person using two identifiers.   I discussed the limitations of evaluation and management by telemedicine and the availability of in person appointments. The patient expressed understanding and agreed to proceed.  Subjective:    CC: severe fatigue  HPI: 49 year old female with chronic fatigue/brain fog-overall she feels like she is doing well.  We increased her Adderall to 15 mg extended release at last office visit about 4 weeks ago.  She says that she really feels like it has been helpful.  She still has days where it is difficult to get everything in and feels like it wears off into the afternoon.  She did have a little bit of hot palpitations when she first started the 10 mg Adderall a couple of months ago but has not had any issues since then.  No chest pain shortness of breath or palpitations.  She does feel like her brain fog is much better.   Past medical history, Surgical history, Family history not pertinant except as noted below, Social history, Allergies, and medications have been entered into the medical record, reviewed, and corrections made.   Review of Systems: No fevers, chills, night sweats, weight loss, chest pain, or shortness of breath.   Objective:    General: Speaking clearly in complete sentences without any shortness of breath.  Alert and oriented x3.  Normal judgment. No apparent acute distress.    Impression and Recommendations:    No problem-specific Assessment & Plan notes found for this encounter.  Fatigue/brain fog-discussed options.  We will add a short acting in the afternoon to use as needed on those longer days.  Again monitor for the typical side effects including chest pain palpitations weight loss, decreased appetite etc.  Plan to follow-up in 4 weeks.     Time spent in  encounter 20 minutes  I discussed the assessment and treatment plan with the patient. The patient was provided an opportunity to ask questions and all were answered. The patient agreed with the plan and demonstrated an understanding of the instructions.   The patient was advised to call back or seek an in-person evaluation if the symptoms worsen or if the condition fails to improve as anticipated.   Beatrice Lecher, MD

## 2019-07-08 ENCOUNTER — Encounter: Payer: Self-pay | Admitting: Family Medicine

## 2019-07-08 NOTE — Telephone Encounter (Signed)
See telephone message

## 2019-07-19 ENCOUNTER — Encounter: Payer: Self-pay | Admitting: Family Medicine

## 2019-07-19 ENCOUNTER — Telehealth (INDEPENDENT_AMBULATORY_CARE_PROVIDER_SITE_OTHER): Payer: Managed Care, Other (non HMO) | Admitting: Family Medicine

## 2019-07-19 VITALS — Temp 97.8°F | Wt 130.0 lb

## 2019-07-19 DIAGNOSIS — F488 Other specified nonpsychotic mental disorders: Secondary | ICD-10-CM | POA: Diagnosis not present

## 2019-07-19 DIAGNOSIS — M0579 Rheumatoid arthritis with rheumatoid factor of multiple sites without organ or systems involvement: Secondary | ICD-10-CM | POA: Diagnosis not present

## 2019-07-19 DIAGNOSIS — R5382 Chronic fatigue, unspecified: Secondary | ICD-10-CM | POA: Diagnosis not present

## 2019-07-19 DIAGNOSIS — R4189 Other symptoms and signs involving cognitive functions and awareness: Secondary | ICD-10-CM

## 2019-07-19 MED ORDER — DULOXETINE HCL 30 MG PO CPEP: 30.0000 mg | ORAL_CAPSULE | Freq: Every day | ORAL | 1 refills | Status: DC

## 2019-07-19 MED ORDER — AMPHETAMINE-DEXTROAMPHET ER 15 MG PO CP24: 15.0000 mg | ORAL_CAPSULE | Freq: Every day | ORAL | 0 refills | Status: DC

## 2019-07-19 MED ORDER — AMPHETAMINE-DEXTROAMPHETAMINE 15 MG PO TABS: 15.0000 mg | ORAL_TABLET | Freq: Every day | ORAL | 0 refills | Status: DC

## 2019-07-19 NOTE — Progress Notes (Signed)
Pt reports that the Adderall is not working as well as it once was. She stated that it has been worse later in the day and is unsure if this has something to do with her RA flares or not.

## 2019-07-19 NOTE — Progress Notes (Signed)
Virtual Visit via Video Note  I connected with Dawn Griffin on 07/19/19 at  1:00 PM EDT by a video enabled telemedicine application and verified that I am speaking with the correct person using two identifiers.   I discussed the limitations of evaluation and management by telemedicine and the availability of in person appointments. The patient expressed understanding and agreed to proceed.  Subjective:    CC: F/U myofascial pain and fatigue.  HPI: She just feeling frustrated and that she continues to have pain and discomfort with her rheumatoid.  She has been on Enbrel for about 6 months but has have a follow-up coming up with Dr. Jerene Pitch next month but she may try to move that up a little bit earlier.  She is been having some other systemic symptoms as well. + hair loss, joint pain, + ear ringing, + rash  Unfortunately her thumb surgery has had some complications the surgery itself went well but then the tendon on her thumb actually popped.  It is completely disconnected and the only way that they could repair it would be to remove part of the tendon from another finger so that would require another significant surgery and she is not quite sure she wants to do that but she does have an appointment coming up with a hand surgeon later this week.  Follow-up chronic fatigue and myofascial pain-recently started her on Adderall and she feels like overall she is doing well.  Still feeling like she is running out of energy particularly in the evenings.  She says a couple times she actually took 2 of the 10 mg in the afternoon and that did seem to work a little bit better for her she denies any chest pain palpitations etc. on the medication.  Past medical history, Surgical history, Family history not pertinant except as noted below, Social history, Allergies, and medications have been entered into the medical record, reviewed, and corrections made.   Review of Systems: No fevers, chills, night sweats,  weight loss, chest pain, or shortness of breath.   Objective:    General: Speaking clearly in complete sentences without any shortness of breath.  Alert and oriented x3.  Normal judgment. No apparent acute distress.    Impression and Recommendations:    Rheumatoid arthritis ([place]) Currently on Enbrel for 6 months.  Has a follow-up with Dr. Jerene Pitch.  Chronic fatigue Gust options.  We will continue with Adderall 15 mg in the morning and increase afternoon dose to 15 mg short release.  New prescriptions were sent to the pharmacy today for her to try for 30 days.  For any chest pain or palpitations or increased blood pressure.   She is working on quitting smoking     Time spent in encounter 22 minutes  I discussed the assessment and treatment plan with the patient. The patient was provided an opportunity to ask questions and all were answered. The patient agreed with the plan and demonstrated an understanding of the instructions.   The patient was advised to call back or seek an in-person evaluation if the symptoms worsen or if the condition fails to improve as anticipated.   Beatrice Lecher, MD

## 2019-07-19 NOTE — Assessment & Plan Note (Signed)
Gust options.  We will continue with Adderall 15 mg in the morning and increase afternoon dose to 15 mg short release.  New prescriptions were sent to the pharmacy today for her to try for 30 days.  For any chest pain or palpitations or increased blood pressure.

## 2019-07-19 NOTE — Assessment & Plan Note (Signed)
Currently on Enbrel for 6 months.  Has a follow-up with Dr. Jerene Pitch.

## 2019-07-20 ENCOUNTER — Encounter: Payer: Self-pay | Admitting: Family Medicine

## 2019-07-20 MED ORDER — DULOXETINE HCL 60 MG PO CPEP: 60.0000 mg | ORAL_CAPSULE | Freq: Every day | ORAL | 0 refills | Status: DC

## 2019-07-21 ENCOUNTER — Other Ambulatory Visit: Payer: Self-pay | Admitting: *Deleted

## 2019-07-21 MED ORDER — DULOXETINE HCL 60 MG PO CPEP: 60.0000 mg | ORAL_CAPSULE | Freq: Every day | ORAL | 1 refills | Status: DC

## 2019-07-27 ENCOUNTER — Encounter: Payer: Self-pay | Admitting: Family Medicine

## 2019-07-27 DIAGNOSIS — R918 Other nonspecific abnormal finding of lung field: Secondary | ICD-10-CM

## 2019-07-27 NOTE — Telephone Encounter (Signed)
Results from Care Everywhere printed to be scanned to chart.   XR CHEST PA AND LATERAL5/17/2021 [facility] Result Impression    No evidence of active airspace disease.  5 mm nodular opacity within the left apex which may represent sequela of biapical pleural-parenchymal scarring however an underlying small nodules are not excluded. Consider follow-up CT of the chest without IV contrast given the provided history of smoking as clinically appropriate.  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 Mediastinum: Within normal limits. Lungs/pleura: 5 mm left apical nodular opacity suggested.. Biapical pleural-parenchymal scarring Upper abdomen: Visualized portions are unremarkable.  Chest wall/osseous structures: Unremarkable.  Other Result Information  Interface, Rad Results In - 07/26/2019  3:58 PM EDT Formatting of this note might be different from the original. XR CHEST PA AND LATERAL, 07/26/2019 3:43 PM  INDICATION: RA, SMOKER \ M05.79 Rheumatoid arthritis involving multiple sites with positive rheumatoid factor (HCC) \ F17.200 Smoker  COMPARISON: None  FINDINGS:   Cardiovascular: Cardiac silhouette and pulmonary vasculature are within normal limits. Mediastinum: Within normal limits. Lungs/pleura: 5 mm left apical nodular opacity suggested.. Biapical pleural-parenchymal scarring Upper abdomen: Visualized portions are unremarkable.  Chest wall/osseous structures: Unremarkable.  CONCLUSION:   No evidence of active airspace disease.  5 mm nodular opacity within the left apex which may represent sequela of biapical pleural-parenchymal scarring however an underlying small nodules are not  excluded. Consider follow-up CT of the chest without IV contrast given the provided history of smoking as clinically appropriate.

## 2019-07-30 NOTE — Telephone Encounter (Signed)
Ct ordered

## 2019-08-03 ENCOUNTER — Other Ambulatory Visit: Payer: Self-pay

## 2019-08-03 ENCOUNTER — Ambulatory Visit (INDEPENDENT_AMBULATORY_CARE_PROVIDER_SITE_OTHER): Payer: Managed Care, Other (non HMO)

## 2019-08-03 DIAGNOSIS — R918 Other nonspecific abnormal finding of lung field: Secondary | ICD-10-CM | POA: Diagnosis not present

## 2019-08-04 ENCOUNTER — Encounter: Payer: Self-pay | Admitting: Family Medicine

## 2019-08-04 DIAGNOSIS — J432 Centrilobular emphysema: Secondary | ICD-10-CM | POA: Insufficient documentation

## 2019-08-10 ENCOUNTER — Other Ambulatory Visit: Payer: Self-pay | Admitting: Family Medicine

## 2019-08-10 DIAGNOSIS — R0602 Shortness of breath: Secondary | ICD-10-CM

## 2019-08-10 NOTE — Telephone Encounter (Signed)
refe placed.

## 2019-08-10 NOTE — Progress Notes (Signed)
Pul ref placed.

## 2019-08-19 ENCOUNTER — Other Ambulatory Visit: Payer: Self-pay | Admitting: Family Medicine

## 2019-08-19 DIAGNOSIS — R4189 Other symptoms and signs involving cognitive functions and awareness: Secondary | ICD-10-CM

## 2019-08-19 DIAGNOSIS — R5382 Chronic fatigue, unspecified: Secondary | ICD-10-CM

## 2019-08-19 MED ORDER — AMPHETAMINE-DEXTROAMPHET ER 15 MG PO CP24: 15.0000 mg | ORAL_CAPSULE | Freq: Every day | ORAL | 0 refills | Status: DC

## 2019-08-19 MED ORDER — AMPHETAMINE-DEXTROAMPHETAMINE 15 MG PO TABS: 15.0000 mg | ORAL_TABLET | Freq: Every day | ORAL | 0 refills | Status: DC

## 2019-09-09 HISTORY — PX: VAGUS NERVE STIMULATOR INSERTION: SHX348

## 2019-09-21 ENCOUNTER — Encounter: Payer: Self-pay | Admitting: Family Medicine

## 2019-09-21 ENCOUNTER — Ambulatory Visit: Payer: Managed Care, Other (non HMO) | Admitting: Family Medicine

## 2019-09-21 ENCOUNTER — Telehealth: Payer: Self-pay

## 2019-09-21 VITALS — BP 120/44 | HR 96 | Wt 128.0 lb

## 2019-09-21 DIAGNOSIS — R300 Dysuria: Secondary | ICD-10-CM

## 2019-09-21 DIAGNOSIS — N3090 Cystitis, unspecified without hematuria: Secondary | ICD-10-CM

## 2019-09-21 LAB — POCT URINALYSIS DIP (CLINITEK)
Bilirubin, UA: NEGATIVE
Glucose, UA: NEGATIVE mg/dL
Ketones, POC UA: NEGATIVE mg/dL
Nitrite, UA: NEGATIVE
POC PROTEIN,UA: >=300 — AB
Spec Grav, UA: >=1.03 — AB (ref 1.010–1.025)
Urobilinogen, UA: 0.2 E.U./dL
pH, UA: 5 (ref 5.0–8.0)

## 2019-09-21 MED ORDER — NITROFURANTOIN MONOHYD MACRO 100 MG PO CAPS: 100.0000 mg | ORAL_CAPSULE | Freq: Two times a day (BID) | ORAL | 0 refills | Status: AC

## 2019-09-21 NOTE — Addendum Note (Signed)
Addended by: Peggye Ley on: 09/21/2019 04:41 PM   Modules accepted: Orders

## 2019-09-21 NOTE — Assessment & Plan Note (Signed)
Rx for macrobid bid x5 days.  Urine sent for culture  Recommend f/u if symptoms worsen.  She is having some RUQ pain as well with seems to be more related to muscle strain.

## 2019-09-21 NOTE — Progress Notes (Signed)
Dawn Griffin - 49 y.o. female MRN [id number]  Date of birth: 1970-05-21  Subjective Chief Complaint  Patient presents with  . Urinary Tract Infection    HPI Dawn Griffin is a 49 y.o. female here today with complaint of dysuria and urinary urgency.  Symptoms started today.  She has had UTI in the past and symptoms are similar.  She is having some pain under the R side of her ribs.  This has been going on for about 2 weeks.  [place]  Area is tender to touch.  She denies increased nausea, fever, chills, back pain, or hematuria.    ROS:  A comprehensive ROS was completed and negative except as noted per HPI  No Known Allergies  History reviewed. No pertinent past medical history.  Past Surgical History:  Procedure Laterality Date  . LUMBAR SPINE SURGERY  2001   L4-5-S1  . TONSILLECTOMY  1990    Social History   Socioeconomic History  . Marital status: Married    Spouse name: Not on file  . Number of children: Not on file  . Years of education: Not on file  . Highest education level: Not on file  Occupational History  . Not on file  Tobacco Use  . Smoking status: Current Every Day Smoker    Packs/day: 0.30    Types: Cigarettes  . Smokeless tobacco: Never Used  Substance and Sexual Activity  . Alcohol use: Yes    Alcohol/week: 1.0 standard drink    Types: 1 Standard drinks or equivalent per week  . Drug use: No  . Sexual activity: Yes    Partners: Male  Other Topics Concern  . Not on file  Social History Narrative   No exercise. + daily caffeine.    Social Determinants of Health   Financial Resource Strain:   . Difficulty of Paying Living Expenses:   Food Insecurity:   . Worried About Charity fundraiser in the Last Year:   . Arboriculturist in the Last Year:   Transportation Needs:   . Film/video editor (Medical):   Marland Kitchen Lack of Transportation (Non-Medical):   Physical Activity:   . Days of Exercise  per Week:   . Minutes of Exercise per Session:   Stress:   . Feeling of Stress :   Social Connections:   . Frequency of Communication with Friends and Family:   . Frequency of Social Gatherings with Friends and Family:   . Attends Religious Services:   . Active Member of Clubs or Organizations:   . Attends Archivist Meetings:   Marland Kitchen Marital Status:     Family History  Problem Relation Age of Onset  . Stomach cancer Paternal Grandmother   . Bipolar disorder Mother     Health Maintenance  Topic Date Due  . Hepatitis C Screening  Never done  . HIV Screening  Never done  . COVID-19 Vaccine (1) 07/18/2020 (Originally 10/27/1984)  . COLONOSCOPY  05/10/2022 (Originally 03/07/2018)  . INFLUENZA VACCINE  06/09/2023 (Originally 10/10/2019)  . PAP SMEAR-Modifier  12/27/2020  . MAMMOGRAM  05/26/2021  . TETANUS/TDAP  01/12/2023     ----------------------------------------------------------------------------------------------------------------------------------------------------------------------------------------------------------------- Physical Exam BP (!) 120/44 (BP Location: Left Arm, Patient Position: Sitting, Cuff Size: Normal)   Pulse 96   Wt 128 lb (58.1 kg)   SpO2 96%   BMI 21.97 kg/m   Physical Exam Constitutional:      Appearance: Normal  appearance.  HENT:     Head: Normocephalic and atraumatic.  Cardiovascular:     Rate and Rhythm: Normal rate.  Pulmonary:     Effort: Pulmonary effort is normal.     Breath sounds: Normal breath sounds.  Abdominal:     General: Abdomen is flat. There is no distension.     Tenderness: There is abdominal tenderness (TTP along floating ribs on R side.  ). There is no right CVA tenderness or left CVA tenderness.  Musculoskeletal:     Cervical back: Neck supple.  Skin:    General: Skin is warm and dry.     Findings: No rash.  Neurological:     General: No focal deficit present.     Mental Status: She is alert.  Psychiatric:         Mood and Affect: Mood normal.        Behavior: Behavior normal.     ------------------------------------------------------------------------------------------------------------------------------------------------------------------------------------------------------------------- Assessment and Plan  Cystitis Rx for macrobid bid x5 days.  Urine sent for culture  Recommend f/u if symptoms worsen.  She is having some RUQ pain as well with seems to be more related to muscle strain.    Meds ordered this encounter  Medications  . nitrofurantoin, macrocrystal-monohydrate, (MACROBID) 100 MG capsule    Sig: Take 1 capsule (100 mg total) by mouth 2 (two) times daily for 5 days.    Dispense:  10 capsule    Refill:  0    No follow-ups on file.    This visit occurred during the SARS-CoV-2 public health emergency.  Safety protocols were in place, including screening questions prior to the visit, additional usage of staff PPE, and extensive cleaning of exam room while observing appropriate contact time as indicated for disinfecting solutions.

## 2019-09-22 ENCOUNTER — Other Ambulatory Visit: Payer: Self-pay | Admitting: Family Medicine

## 2019-09-22 DIAGNOSIS — R4189 Other symptoms and signs involving cognitive functions and awareness: Secondary | ICD-10-CM

## 2019-09-22 DIAGNOSIS — R5382 Chronic fatigue, unspecified: Secondary | ICD-10-CM

## 2019-09-22 LAB — URINE CULTURE
MICRO NUMBER:: 10699216
SPECIMEN QUALITY:: ADEQUATE

## 2019-09-22 NOTE — Telephone Encounter (Signed)
Disregard. Erroneous Entry

## 2019-10-01 ENCOUNTER — Other Ambulatory Visit: Payer: Self-pay

## 2019-10-01 DIAGNOSIS — R5382 Chronic fatigue, unspecified: Secondary | ICD-10-CM

## 2019-10-01 DIAGNOSIS — R4189 Other symptoms and signs involving cognitive functions and awareness: Secondary | ICD-10-CM

## 2019-10-01 DIAGNOSIS — F488 Other specified nonpsychotic mental disorders: Secondary | ICD-10-CM

## 2019-10-01 MED ORDER — AMPHETAMINE-DEXTROAMPHET ER 15 MG PO CP24: 15.0000 mg | ORAL_CAPSULE | Freq: Every day | ORAL | 0 refills | Status: DC

## 2019-10-01 MED ORDER — AMPHETAMINE-DEXTROAMPHETAMINE 15 MG PO TABS: 15.0000 mg | ORAL_TABLET | Freq: Every day | ORAL | 0 refills | Status: DC

## 2019-10-01 NOTE — Telephone Encounter (Signed)
Dawn Griffin needs a refill on Adderall. She has an appointment next week.

## 2019-10-05 ENCOUNTER — Telehealth (INDEPENDENT_AMBULATORY_CARE_PROVIDER_SITE_OTHER): Payer: Managed Care, Other (non HMO) | Admitting: Family Medicine

## 2019-10-05 ENCOUNTER — Encounter: Payer: Self-pay | Admitting: Family Medicine

## 2019-10-05 DIAGNOSIS — R5382 Chronic fatigue, unspecified: Secondary | ICD-10-CM

## 2019-10-05 DIAGNOSIS — M0579 Rheumatoid arthritis with rheumatoid factor of multiple sites without organ or systems involvement: Secondary | ICD-10-CM | POA: Diagnosis not present

## 2019-10-05 DIAGNOSIS — R0602 Shortness of breath: Secondary | ICD-10-CM

## 2019-10-05 DIAGNOSIS — F488 Other specified nonpsychotic mental disorders: Secondary | ICD-10-CM

## 2019-10-05 DIAGNOSIS — R4189 Other symptoms and signs involving cognitive functions and awareness: Secondary | ICD-10-CM

## 2019-10-05 MED ORDER — AMPHETAMINE-DEXTROAMPHETAMINE 15 MG PO TABS: 15.0000 mg | ORAL_TABLET | Freq: Every day | ORAL | 0 refills | Status: DC

## 2019-10-05 MED ORDER — AMPHETAMINE-DEXTROAMPHET ER 15 MG PO CP24: 15.0000 mg | ORAL_CAPSULE | ORAL | 0 refills | Status: DC

## 2019-10-05 MED ORDER — AMPHETAMINE-DEXTROAMPHET ER 15 MG PO CP24: 15.0000 mg | ORAL_CAPSULE | Freq: Every day | ORAL | 0 refills | Status: DC

## 2019-10-05 NOTE — Assessment & Plan Note (Signed)
Chronic fatigue-she is really hoping that this vagal stimulator will be helpful in reducing her immune system response to rheumatoid which in turn will really help with the fatigue that she is struggling with.  She does feel like the Adderall is working well and has been helpful she is okay with staying at her current dosing regimen of 50 mg standard release in the morning and short acting the afternoon.  New prescriptions sent to the pharmacy for the next 90 days.  When she gets to the end of that prescription then will need to follow-up in November.  If any point she wants to discontinue medication she is more than welcome to do that

## 2019-10-05 NOTE — Progress Notes (Signed)
Research study for RA Had a vagus nerve stimulator placed yesterday, stopped Enbrel  Following up for fatigue

## 2019-10-05 NOTE — Assessment & Plan Note (Signed)
Currently enrolled in a clinical trial as above.  Had a vagal stimulator placed yesterday.  Hopefully she will have great results.  She is currently off of prescription medication for the rheumatoid.

## 2019-10-05 NOTE — Assessment & Plan Note (Signed)
She did have a consultation with Dr. Michela Pitcher.  Normal pulmonary function which is great.  She will follow up in 1 year with him.

## 2019-10-05 NOTE — Progress Notes (Signed)
Virtual Visit via Video Note  I connected with Dawn Griffin on 10/05/19 at  1:20 PM EDT by a video enabled telemedicine application and verified that I am speaking with the correct person using two identifiers.   I discussed the limitations of evaluation and management by telemedicine and the availability of in person appointments. The patient expressed understanding and agreed to proceed.  Patient location:at home  Provider location: in office  Subjective:    CC: Chronic fatigue  HPI: Follow-up for chronic fatigue and myofascial pain.  We started her on Adderall several months ago.  She does feel like it has been somewhat helpful.  Her biggest struggle is continuing to work and help take care of her family with such poor energy levels.  She is currently on 15 mg extended release in the morning and 50 mg short acting in the afternoon.  She says ultimately her goal is to get off the medication if the vagal stimulator works well.    Follow-up rheumatoid arthritis she is actually off of Enbrel now.  She has been working with Dr. Jerene Pitch who was recently entered her into a clinical trial. Trial device is with [employer] 12 week trial double blind placebo controlled trial.   We also referred her for shortness of breath when I last saw her she actually did do a consultation with Dr. Michela Pitcher with a pulmonologist here in [place].  Her pulmonary function test was normal and CT scan from [facility] just showed some potential mild emphysema but she has pretty good preserved lung function without any obstruction.  He just recommended repeat annual pulmonary function testing yearly especially well methotrexate.  Taking precautions to avoid getting Covid and other illnesses.  Past medical history, Surgical history, Family history not pertinant except as noted below, Social history, Allergies, and medications have been entered into the medical record, reviewed, and corrections made.   Review of  Systems: No fevers, chills, night sweats, weight loss, chest pain, or shortness of breath.   Objective:    General: Speaking clearly in complete sentences without any shortness of breath.  Alert and oriented x3.  Normal judgment. No apparent acute distress.    Impression and Recommendations:    Chronic fatigue Chronic fatigue-she is really hoping that this vagal stimulator will be helpful in reducing her immune system response to rheumatoid which in turn will really help with the fatigue that she is struggling with.  She does feel like the Adderall is working well and has been helpful she is okay with staying at her current dosing regimen of 50 mg standard release in the morning and short acting the afternoon.  New prescriptions sent to the pharmacy for the next 90 days.  When she gets to the end of that prescription then will need to follow-up in November.  If any point she wants to discontinue medication she is more than welcome to do that  SOB (shortness of breath) She did have a consultation with Dr. Michela Pitcher.  Normal pulmonary function which is great.  She will follow up in 1 year with him.  Rheumatoid arthritis ([place]) Currently enrolled in a clinical trial as above.  Had a vagal stimulator placed yesterday.  Hopefully she will have great results.  She is currently off of prescription medication for the rheumatoid.    Time spent in encounter 21 minutes  I discussed the assessment and treatment plan with the patient. The patient was provided an opportunity to ask questions and all were answered.  The patient agreed with the plan and demonstrated an understanding of the instructions.   The patient was advised to call back or seek an in-person evaluation if the symptoms worsen or if the condition fails to improve as anticipated.   Beatrice Lecher, MD

## 2019-10-10 ENCOUNTER — Encounter: Payer: Self-pay | Admitting: Family Medicine

## 2019-10-11 MED ORDER — FLUCONAZOLE 150 MG PO TABS
150.0000 mg | ORAL_TABLET | Freq: Once | ORAL | 1 refills | Status: AC
Start: 2019-10-11 — End: 2019-10-11

## 2019-10-11 NOTE — Telephone Encounter (Signed)
RX pended if you are OK to send

## 2019-12-01 ENCOUNTER — Encounter: Payer: Self-pay | Admitting: Family Medicine

## 2019-12-16 ENCOUNTER — Other Ambulatory Visit: Payer: Self-pay | Admitting: Family Medicine

## 2019-12-16 ENCOUNTER — Other Ambulatory Visit: Payer: Self-pay

## 2019-12-16 ENCOUNTER — Ambulatory Visit
Admission: RE | Admit: 2019-12-16 | Discharge: 2019-12-16 | Disposition: A | Discharge status: Home / Self Care | Payer: Managed Care, Other (non HMO) | Source: Ambulatory Visit | Attending: Family Medicine | Admitting: Family Medicine

## 2019-12-16 DIAGNOSIS — N6320 Unspecified lump in the left breast, unspecified quadrant: Secondary | ICD-10-CM

## 2019-12-20 ENCOUNTER — Ambulatory Visit: Payer: Managed Care, Other (non HMO) | Admitting: Family Medicine

## 2019-12-23 ENCOUNTER — Encounter: Payer: Self-pay | Admitting: Family Medicine

## 2019-12-23 ENCOUNTER — Ambulatory Visit (INDEPENDENT_AMBULATORY_CARE_PROVIDER_SITE_OTHER): Payer: Managed Care, Other (non HMO) | Admitting: Family Medicine

## 2019-12-23 VITALS — BP 107/43 | HR 84 | Ht 64.0 in | Wt 125.0 lb

## 2019-12-23 DIAGNOSIS — R5382 Chronic fatigue, unspecified: Secondary | ICD-10-CM

## 2019-12-23 DIAGNOSIS — H93A1 Pulsatile tinnitus, right ear: Secondary | ICD-10-CM | POA: Diagnosis not present

## 2019-12-23 DIAGNOSIS — H9191 Unspecified hearing loss, right ear: Secondary | ICD-10-CM | POA: Diagnosis not present

## 2019-12-23 NOTE — Progress Notes (Signed)
Acute Office Visit  Subjective:    Patient ID: Dawn Griffin, female    DOB: 10-Jul-1970, 49 y.o.   MRN: [id number]  Chief Complaint  Patient presents with  . Follow-up    HPI Patient is in today for heartbeat sound in her right ear.  She says has been going on since January.  Says she feels like it is getting worse.  She says initially she would notice it more when she was lying down in the evening.  But now she notices it with activity she says it does not even have to be a strenuous activity or physical exercise and she will notice the pounding of her heartbeat in her right ear.  No pain.  But she does feel like she has had a change in her hearing in the ear she does have a history of chronic tinnitus.  No known peripheral vascular disease.  No recent sinus symptoms.  Has noticed her heart rate goes up to 190 with exercise sometimes.  She has not had any chest pain or feeling like her heart is pounding or racing with activity.  No past medical history on file.  Past Surgical History:  Procedure Laterality Date  . LUMBAR SPINE SURGERY  2001   L4-5-S1  . TONSILLECTOMY  1990    Family History  Problem Relation Age of Onset  . Stomach cancer Paternal Grandmother   . Bipolar disorder Mother     Social History   Socioeconomic History  . Marital status: Married    Spouse name: Not on file  . Number of children: Not on file  . Years of education: Not on file  . Highest education level: Not on file  Occupational History  . Not on file  Tobacco Use  . Smoking status: Current Every Day Smoker    Packs/day: 0.30    Types: Cigarettes  . Smokeless tobacco: Never Used  Substance and Sexual Activity  . Alcohol use: Yes    Alcohol/week: 1.0 standard drink    Types: 1 Standard drinks or equivalent per week  . Drug use: No  . Sexual activity: Yes    Partners: Male  Other Topics Concern  . Not on file  Social History Narrative   No exercise. + daily caffeine.    Social  Determinants of Health   Financial Resource Strain:   . Difficulty of Paying Living Expenses: Not on file  Food Insecurity:   . Worried About Charity fundraiser in the Last Year: Not on file  . Ran Out of Food in the Last Year: Not on file  Transportation Needs:   . Lack of Transportation (Medical): Not on file  . Lack of Transportation (Non-Medical): Not on file  Physical Activity:   . Days of Exercise per Week: Not on file  . Minutes of Exercise per Session: Not on file  Stress:   . Feeling of Stress : Not on file  Social Connections:   . Frequency of Communication with Friends and Family: Not on file  . Frequency of Social Gatherings with Friends and Family: Not on file  . Attends Religious Services: Not on file  . Active Member of Clubs or Organizations: Not on file  . Attends Archivist Meetings: Not on file  . Marital Status: Not on file  Intimate Partner Violence:   . Fear of Current or Ex-Partner: Not on file  . Emotionally Abused: Not on file  . Physically Abused: Not on file  .  Sexually Abused: Not on file    Outpatient Medications Prior to Visit  Medication Sig Dispense Refill  . amphetamine-dextroamphetamine (ADDERALL XR) 15 MG 24 hr capsule Take 1 capsule by mouth daily. 30 capsule 0  . amphetamine-dextroamphetamine (ADDERALL XR) 15 MG 24 hr capsule Take 1 capsule by mouth every morning. 30 capsule 0  . [START ON 12/29/2019] amphetamine-dextroamphetamine (ADDERALL XR) 15 MG 24 hr capsule Take 1 capsule by mouth every morning. 30 capsule 0  . amphetamine-dextroamphetamine (ADDERALL) 15 MG tablet Take 1 tablet by mouth daily after lunch. 30 tablet 0  . amphetamine-dextroamphetamine (ADDERALL) 15 MG tablet Take 1 tablet by mouth daily. after lunch 30 tablet 0  . [START ON 12/29/2019] amphetamine-dextroamphetamine (ADDERALL) 15 MG tablet Take 1 tablet by mouth daily. After lunch 30 tablet 0  . b complex vitamins tablet Take by mouth.    . Cats Claw, Uncaria  tomentosa, (CATS CLAW PO) Take by mouth.    . DULoxetine (CYMBALTA) 60 MG capsule Take 1 capsule (60 mg total) by mouth daily. 90 capsule 1  . folic acid (FOLVITE) 1 MG tablet     . hydroxychloroquine (PLAQUENIL) 200 MG tablet Take 200 mg by mouth 2 (two) times daily.    Marland Kitchen loperamide (IMODIUM) 2 MG capsule Take by mouth as needed for diarrhea or loose stools.    . methotrexate (RHEUMATREX) 2.5 MG tablet once a week. 6 tablets weekly     No facility-administered medications prior to visit.    No Known Allergies  Review of Systems     Objective:    Physical Exam Constitutional:      Appearance: She is well-developed.  HENT:     Head: Normocephalic and atraumatic.     Right Ear: External ear normal.     Left Ear: External ear normal.     Nose: Nose normal.  Eyes:     Conjunctiva/sclera: Conjunctivae normal.     Pupils: Pupils are equal, round, and reactive to light.  Neck:     Thyroid: No thyromegaly.     Vascular: No carotid bruit.  Cardiovascular:     Rate and Rhythm: Normal rate and regular rhythm.     Heart sounds: Normal heart sounds.     Comments: No carotid bruits.  Pulmonary:     Effort: Pulmonary effort is normal.     Breath sounds: Normal breath sounds. No wheezing.  Musculoskeletal:     Cervical back: Neck supple.  Lymphadenopathy:     Cervical: No cervical adenopathy.  Skin:    General: Skin is warm and dry.  Neurological:     Mental Status: She is alert and oriented to person, place, and time.  Psychiatric:        Behavior: Behavior normal.     BP (!) 107/43   Pulse 84   Ht 5\' 4"  (1.626 m)   Wt 125 lb (56.7 kg)   SpO2 100%   BMI 21.46 kg/m  Wt Readings from Last 3 Encounters:  12/23/19 125 lb (56.7 kg)  10/05/19 128 lb (58.1 kg)  09/21/19 128 lb (58.1 kg)    Health Maintenance Due  Topic Date Due  . Hepatitis C Screening  Never done  . HIV Screening  Never done    There are no preventive care reminders to display for this  patient.   Lab Results  Component Value Date   TSH 0.89 06/25/2018   Lab Results  Component Value Date   WBC 7.2 06/25/2018   HGB 13.0  06/25/2018   HCT 39.1 06/25/2018   MCV 92.4 06/25/2018   PLT 247 06/25/2018   Lab Results  Component Value Date   NA 136 06/25/2018   K 4.3 06/25/2018   CO2 29 06/25/2018   GLUCOSE 88 06/25/2018   BUN 8 06/25/2018   CREATININE 0.71 06/25/2018   BILITOT 0.5 06/25/2018   ALKPHOS 38 03/18/2016   AST 29 06/25/2018   ALT 32 (H) 06/25/2018   PROT 6.7 06/25/2018   ALBUMIN 4.0 03/18/2016   CALCIUM 9.8 06/25/2018   Lab Results  Component Value Date   CHOL 177 05/27/2019   Lab Results  Component Value Date   HDL 59 05/27/2019   Lab Results  Component Value Date   LDLCALC 91 05/27/2019   Lab Results  Component Value Date   TRIG 171 (H) 05/27/2019   Lab Results  Component Value Date   CHOLHDL 3.0 05/27/2019   No results found for: HGBA1C     Assessment & Plan:   Problem List Items Addressed This Visit      Nervous and Auditory   Pulsatile tinnitus of right ear - Primary    Unclear etiology. Normal ear exam. No bruits on exam. No thyromegaly. Will evaluate with carotid doppled.  Since also noted hearing change will order Audiometry. Consider Brain MRI when implantable stimulator is turned off later this month.  She also feels the eye will "flash" with the pulsing as well.       Relevant Orders   US Carotid Duplex Bilateral   Ambulatory referral to Audiology     Other   Chronic fatigue    Doing well with Adderall. Has improved QOL.  Doesn't need refills today.        Other Visit Diagnoses    Change in hearing of right ear       Relevant Orders   Ambulatory referral to Audiology       No orders of the defined types were placed in this encounter.    Beatrice Lecher, MD

## 2019-12-23 NOTE — Assessment & Plan Note (Signed)
Unclear etiology. Normal ear exam. No bruits on exam. No thyromegaly. Will evaluate with carotid doppled.  Since also noted hearing change will order Audiometry. Consider Brain MRI when implantable stimulator is turned off later this month.  She also feels the eye will "flash" with the pulsing as well.

## 2019-12-23 NOTE — Progress Notes (Signed)
She reports that her sxs began in January. The sound has gotten worse over the past month. She stated in the beginning it would only bother her if she was lying down. She now states that it is constant and if she exerts herself to the point of her heart rate increasing it is worse. This is only affecting her R ear.   She denies any previous ear issues. No sinus issues.

## 2019-12-23 NOTE — Assessment & Plan Note (Signed)
Doing well with Adderall. Has improved QOL.  Doesn't need refills today.

## 2019-12-28 NOTE — Addendum Note (Signed)
Addended by: Beatrice Lecher D on: 12/28/2019 06:39 PM   Modules accepted: Orders

## 2019-12-28 NOTE — Telephone Encounter (Signed)
CT ordered. 

## 2019-12-28 NOTE — Telephone Encounter (Signed)
OK, thank you.  If possible, I would prefer to do the an MRI over the CT just because the MRI has no radiation in the CT has more especially for get a scan your head.  But I am open to doing whichever is the easiest with your implant.  If you think we can get the MRI done on the day that they turn it off and it would not be too crazy or burdensome that I am happy to work on that.  If you would rather just do the CT, I can get that scheduled here

## 2019-12-29 ENCOUNTER — Other Ambulatory Visit: Payer: Managed Care, Other (non HMO)

## 2020-01-04 ENCOUNTER — Ambulatory Visit (INDEPENDENT_AMBULATORY_CARE_PROVIDER_SITE_OTHER): Payer: Managed Care, Other (non HMO)

## 2020-01-04 ENCOUNTER — Other Ambulatory Visit: Payer: Self-pay

## 2020-01-04 DIAGNOSIS — H93A1 Pulsatile tinnitus, right ear: Secondary | ICD-10-CM

## 2020-01-04 DIAGNOSIS — H9191 Unspecified hearing loss, right ear: Secondary | ICD-10-CM | POA: Diagnosis not present

## 2020-01-07 ENCOUNTER — Encounter: Payer: Self-pay | Admitting: Family Medicine

## 2020-01-07 DIAGNOSIS — R0602 Shortness of breath: Secondary | ICD-10-CM

## 2020-01-07 DIAGNOSIS — R9431 Abnormal electrocardiogram [ECG] [EKG]: Secondary | ICD-10-CM

## 2020-01-07 DIAGNOSIS — H93A1 Pulsatile tinnitus, right ear: Secondary | ICD-10-CM

## 2020-01-10 ENCOUNTER — Other Ambulatory Visit: Payer: Self-pay | Admitting: Family Medicine

## 2020-01-15 NOTE — Telephone Encounter (Signed)
Cardiology referral made

## 2020-01-18 ENCOUNTER — Encounter: Payer: Self-pay | Admitting: Family Medicine

## 2020-01-20 NOTE — Telephone Encounter (Signed)
Patient is scheduled with [facility] on 11/19 - [facility]

## 2020-01-28 ENCOUNTER — Ambulatory Visit: Payer: Managed Care, Other (non HMO) | Admitting: Cardiology

## 2020-01-28 ENCOUNTER — Encounter: Payer: Self-pay | Admitting: Cardiology

## 2020-01-28 ENCOUNTER — Other Ambulatory Visit: Payer: Self-pay

## 2020-01-28 VITALS — BP 110/80 | HR 78 | Ht 64.0 in | Wt 126.0 lb

## 2020-01-28 DIAGNOSIS — R9431 Abnormal electrocardiogram [ECG] [EKG]: Secondary | ICD-10-CM

## 2020-01-28 DIAGNOSIS — R0602 Shortness of breath: Secondary | ICD-10-CM

## 2020-01-28 MED ORDER — METOPROLOL TARTRATE 100 MG PO TABS: 100.0000 mg | ORAL_TABLET | Freq: Once | ORAL | 0 refills | Status: DC

## 2020-01-28 NOTE — Patient Instructions (Signed)
Medication Instructions:  The current medical regimen is effective;  continue present plan and medications.  *If you need a refill on your cardiac medications before your next appointment, please call your pharmacy*  Testing/Procedures: Your physician has requested that you have an echocardiogram. Echocardiography is a painless test that uses sound waves to create images of your heart. It provides your doctor with information about the size and shape of your heart and how well your heart's chambers and valves are working. This procedure takes approximately one hour. There are no restrictions for this procedure.  Your cardiac CT will be scheduled at:   [facility] [address] [place], [place] [address] [phone]  Please arrive at the [facility] main entrance of [facility] 30 minutes prior to test start time. Proceed to the [facility] Radiology Department (first floor) to check-in and test prep.  Please follow these instructions carefully (unless otherwise directed):   On the Night Before the Test: . Be sure to Drink plenty of water. . Do not consume any caffeinated/decaffeinated beverages or chocolate 12 hours prior to your test. . Do not take any antihistamines 12 hours prior to your test.  On the Day of the Test: . Drink plenty of water. Do not drink any water within one hour of the test. . Do not eat any food 4 hours prior to the test. . You may take your regular medications prior to the test.  . Take metoprolol (Lopressor) two hours prior to test. . HOLD Furosemide/Hydrochlorothiazide morning of the test. . FEMALES- please wear underwire-free bra if available      After the Test: . Drink plenty of water. . After receiving IV contrast, you may experience a mild flushed feeling. This is normal. . On occasion, you may experience a mild rash up to 24 hours after the test. This is not dangerous. If this occurs, you can take Benadryl 25 mg and increase  your fluid intake. . If you experience trouble breathing, this can be serious. If it is severe call 911 IMMEDIATELY. If it is mild, please call our office.  Once we have confirmed authorization from your insurance company, we will call you to set up a date and time for your test. Based on how quickly your insurance processes prior authorizations requests, please allow up to 4 weeks to be contacted for scheduling your Cardiac CT appointment. Be advised that routine Cardiac CT appointments could be scheduled as many as 8 weeks after your provider has ordered it.  For non-scheduling related questions, please contact the cardiac imaging nurse navigator should you have any questions/concerns: Marchia Bond, Cardiac Imaging Nurse Navigator Burley Saver, Interim Cardiac Imaging Nurse [facility] Direct Office Dial: [phone]   For scheduling needs, including cancellations and rescheduling, please call [place], [phone] (temporary number).   Follow-Up: At [facility], you and your health needs are our priority.  As part of our continuing mission to provide you with exceptional heart care, we have created designated Provider Care Teams.  These Care Teams include your primary Cardiologist (physician) and Advanced Practice Providers (APPs -  Physician Assistants and Nurse Practitioners) who all work together to provide you with the care you need, when you need it.  We recommend signing up for the patient portal called "MyChart".  Sign up information is provided on this After Visit Summary.  MyChart is used to connect with patients for Virtual Visits (Telemedicine).  Patients are able to view  lab/test results, encounter notes, upcoming appointments, etc.  Non-urgent messages can be sent to your provider as well.   To learn more about what you can do with MyChart, go to [email or web address].    Your next appointment:   Follow up to be determined after the above  testing has been completed.  Thank you for choosing [facility]!!

## 2020-01-28 NOTE — Progress Notes (Signed)
Cardiology Office Note:    Date:  01/28/2020   ID:  [name], DOB 10/23/1970, MRN [id number]  PCP:  Hali Marry, MD  [place] Cardiologist:  No primary care provider on file.  [facility] Electrophysiologist:  None   Referring MD: Hali Marry, *     History of Present Illness:    Dawn Griffin is a 49 y.o. female here for the evaluation of abnormal EKG at the request of Dr. Ward Chatters.  She has rheumatoid arthritis.  Former long-term smoker.  No early family history of CAD.  She was in a rheumatoid arthritis trial with a vagal nerve stimulator in [place] and had 2 EKGs that were interpreted as abnormal.  She remembers them telling her that she may have had an old heart attack.  Her EKG today shows poor R wave progression V1 V2 V3.  She also has pectus abnormality.  Personally reviewed CT scan with her.  Pectus deformity does impinge slightly on her right ventricular structures.  She has been feeling shortness of breath and fatigue.  No significant chest discomfort.  When she walks on the treadmill for instance at the gym, her heart rate can increase pretty rapidly.  No past medical history on file.  Past Surgical History:  Procedure Laterality Date  . LUMBAR SPINE SURGERY  2001   L4-5-S1  . TONSILLECTOMY  1990    Current Medications: Current Meds  Medication Sig  . b complex vitamins tablet Take by mouth.  . Cats Claw, Uncaria tomentosa, (CATS CLAW PO) Take by mouth.  . DULoxetine (CYMBALTA) 60 MG capsule TAKE 1 CAPSULE DAILY  . folic acid (FOLVITE) 1 MG tablet   . hydroxychloroquine (PLAQUENIL) 200 MG tablet Take 200 mg by mouth 2 (two) times daily.  Marland Kitchen loperamide (IMODIUM) 2 MG capsule Take by mouth as needed for diarrhea or loose stools.  . methotrexate (RHEUMATREX) 2.5 MG tablet once a week. 6 tablets weekly     Allergies:   Patient has no known allergies.   Social History   Socioeconomic History  . Marital status: Married     Spouse name: Not on file  . Number of children: Not on file  . Years of education: Not on file  . Highest education level: Not on file  Occupational History  . Not on file  Tobacco Use  . Smoking status: Current Every Day Smoker    Packs/day: 0.30    Types: Cigarettes  . Smokeless tobacco: Never Used  Substance and Sexual Activity  . Alcohol use: Yes    Alcohol/week: 1.0 standard drink    Types: 1 Standard drinks or equivalent per week  . Drug use: No  . Sexual activity: Yes    Partners: Male  Other Topics Concern  . Not on file  Social History Narrative   No exercise. + daily caffeine.    Social Determinants of Health   Financial Resource Strain:   . Difficulty of Paying Living Expenses: Not on file  Food Insecurity:   . Worried About Charity fundraiser in the Last Year: Not on file  . Ran Out of Food in the Last Year: Not on file  Transportation Needs:   . Lack of Transportation (Medical): Not on file  . Lack of Transportation (Non-Medical): Not on file  Physical Activity:   . Days of Exercise per Week: Not on file  . Minutes of Exercise per Session: Not on file  Stress:   . Feeling of Stress :  Not on file  Social Connections:   . Frequency of Communication with Friends and Family: Not on file  . Frequency of Social Gatherings with Friends and Family: Not on file  . Attends Religious Services: Not on file  . Active Member of Clubs or Organizations: Not on file  . Attends Archivist Meetings: Not on file  . Marital Status: Not on file     Family History: The patient's family history includes Bipolar disorder in her mother; Stomach cancer in her paternal grandmother.  ROS:   Please see the history of present illness.    She is also been feeling rushing in her ears.  Denies any fevers chills nausea vomiting syncope bleeding all other systems reviewed and are negative.  EKGs/Labs/Other Studies Reviewed:    The following studies were reviewed today: CT  scan EKG lab work  EKG:  EKG is  ordered today.  The ekg ordered today demonstrates poor R wave progression sinus rhythm possible old anterior infarct pattern.  Recent Labs: No results found for requested labs within last 8760 hours.  Recent Lipid Panel    Component Value Date/Time   CHOL 177 05/27/2019 1037   TRIG 171 (H) 05/27/2019 1037   HDL 59 05/27/2019 1037   CHOLHDL 3.0 05/27/2019 1037   VLDL 22 03/18/2016 0803   LDLCALC 91 05/27/2019 1037     Risk Assessment/Calculations:       Physical Exam:    VS:  BP 110/80   Pulse 78   Ht '5\' 4"'  (1.626 m)   Wt 126 lb (57.2 kg)   SpO2 99%   BMI 21.63 kg/m     Wt Readings from Last 3 Encounters:  01/28/20 126 lb (57.2 kg)  12/23/19 125 lb (56.7 kg)  10/05/19 128 lb (58.1 kg)     GEN: Thin, well developed in no acute distress HEENT: Normal NECK: No JVD; No carotid bruits LYMPHATICS: No lymphadenopathy CARDIAC: RRR, no murmurs, rubs, gallops RESPIRATORY:  Clear to auscultation without rales, wheezing or rhonchi  ABDOMEN: Soft, non-tender, non-distended MUSCULOSKELETAL:  No edema; No deformity  SKIN: Warm and dry NEUROLOGIC:  Alert and oriented x 3 PSYCHIATRIC:  Normal affect   ASSESSMENT:    1. Nonspecific abnormal electrocardiogram (ECG) (EKG)   2. Shortness of breath    PLAN:    In order of problems listed above:  Shortness of breath/fatigue/abnormal EKG -Poor R wave progression is noted on the EKG.  This could be secondary to her pectus deformity slightly changing the electrical vectors of her heart. -We will check an echocardiogram to ensure proper structure and function and to make sure that she does not in fact have any evidence of old anterior infarct pattern. -Given her shortness of breath and fatigue as well as underlying rheumatoid arthritis and recent quit but many years smoker, we will check a coronary CT scan with possible FFR analysis. -She has seen pulmonary-normal spirometry she  states.    Shared Decision Making/Informed Consent        Medication Adjustments/Labs and Tests Ordered: Current medicines are reviewed at length with the patient today.  Concerns regarding medicines are outlined above.  Orders Placed This Encounter  Procedures  . CT CORONARY MORPH W/CTA COR W/SCORE W/CA W/CM &/OR WO/CM  . CT CORONARY FRACTIONAL FLOW RESERVE DATA PREP  . CT CORONARY FRACTIONAL FLOW RESERVE FLUID ANALYSIS  . ECHOCARDIOGRAM COMPLETE   Meds ordered this encounter  Medications  . metoprolol tartrate (LOPRESSOR) 100 MG tablet  Sig: Take 1 tablet (100 mg total) by mouth once for 1 dose. Take one tablet 2 hours before your CT scan    Dispense:  1 tablet    Refill:  0    Patient Instructions  Medication Instructions:  The current medical regimen is effective;  continue present plan and medications.  *If you need a refill on your cardiac medications before your next appointment, please call your pharmacy*  Testing/Procedures: Your physician has requested that you have an echocardiogram. Echocardiography is a painless test that uses sound waves to create images of your heart. It provides your doctor with information about the size and shape of your heart and how well your heart's chambers and valves are working. This procedure takes approximately one hour. There are no restrictions for this procedure.  Your cardiac CT will be scheduled at:   [facility] [address] [place], [place] [address] [phone]  Please arrive at the [facility] main entrance of [facility] 30 minutes prior to test start time. Proceed to the [facility] Radiology Department (first floor) to check-in and test prep.  Please follow these instructions carefully (unless otherwise directed):   On the Night Before the Test: . Be sure to Drink plenty of water. . Do not consume any caffeinated/decaffeinated beverages or chocolate 12 hours prior to your test. . Do not  take any antihistamines 12 hours prior to your test.  On the Day of the Test: . Drink plenty of water. Do not drink any water within one hour of the test. . Do not eat any food 4 hours prior to the test. . You may take your regular medications prior to the test.  . Take metoprolol (Lopressor) two hours prior to test. . HOLD Furosemide/Hydrochlorothiazide morning of the test. . FEMALES- please wear underwire-free bra if available      After the Test: . Drink plenty of water. . After receiving IV contrast, you may experience a mild flushed feeling. This is normal. . On occasion, you may experience a mild rash up to 24 hours after the test. This is not dangerous. If this occurs, you can take Benadryl 25 mg and increase your fluid intake. . If you experience trouble breathing, this can be serious. If it is severe call 911 IMMEDIATELY. If it is mild, please call our office.  Once we have confirmed authorization from your insurance company, we will call you to set up a date and time for your test. Based on how quickly your insurance processes prior authorizations requests, please allow up to 4 weeks to be contacted for scheduling your Cardiac CT appointment. Be advised that routine Cardiac CT appointments could be scheduled as many as 8 weeks after your provider has ordered it.  For non-scheduling related questions, please contact the cardiac imaging nurse navigator should you have any questions/concerns: Marchia Bond, Cardiac Imaging Nurse Navigator Burley Saver, Interim Cardiac Imaging Nurse [facility] [facility] Direct Office Dial: [phone]   For scheduling needs, including cancellations and rescheduling, please call [place], [phone] (temporary number).   Follow-Up: At [facility], you and your health needs are our priority.  As part of our continuing mission to provide you with exceptional heart care, we have created designated Provider Care Teams.  These  Care Teams include your primary Cardiologist (physician) and Advanced Practice Providers (APPs -  Physician Assistants and Nurse Practitioners) who all work together to provide you with the care you need, when you  need it.  We recommend signing up for the patient portal called "MyChart".  Sign up information is provided on this After Visit Summary.  MyChart is used to connect with patients for Virtual Visits (Telemedicine).  Patients are able to view lab/test results, encounter notes, upcoming appointments, etc.  Non-urgent messages can be sent to your provider as well.   To learn more about what you can do with MyChart, go to [email or web address].    Your next appointment:   Follow up to be determined after the above testing has been completed.  Thank you for choosing [facility]!!         Signed, Candee Furbish, MD  01/28/2020 3:29 PM    [place]

## 2020-02-01 DIAGNOSIS — I671 Cerebral aneurysm, nonruptured: Secondary | ICD-10-CM | POA: Insufficient documentation

## 2020-02-02 NOTE — Addendum Note (Signed)
Addended by: Maren Beach,  A on: 02/02/2020 05:38 PM   Modules accepted: Orders

## 2020-02-05 ENCOUNTER — Encounter: Payer: Self-pay | Admitting: Family Medicine

## 2020-02-08 ENCOUNTER — Telehealth (HOSPITAL_COMMUNITY): Payer: Self-pay | Admitting: Emergency Medicine

## 2020-02-08 NOTE — Telephone Encounter (Signed)
Reaching out to patient to offer assistance regarding upcoming cardiac imaging study; pt verbalizes understanding of appt date/time, parking situation and where to check in, pre-test NPO status and medications ordered, and verified current allergies; name and call back number provided for further questions should they arise   RN Navigator Cardiac Imaging [facility] [phone] office [phone] cell 

## 2020-02-09 ENCOUNTER — Ambulatory Visit (HOSPITAL_COMMUNITY)
Admission: RE | Admit: 2020-02-09 | Discharge: 2020-02-09 | Disposition: A | Discharge status: Home / Self Care | Payer: Managed Care, Other (non HMO) | Source: Ambulatory Visit | Attending: Cardiology | Admitting: Cardiology

## 2020-02-09 ENCOUNTER — Other Ambulatory Visit: Payer: Self-pay

## 2020-02-09 DIAGNOSIS — R9431 Abnormal electrocardiogram [ECG] [EKG]: Secondary | ICD-10-CM | POA: Diagnosis not present

## 2020-02-09 DIAGNOSIS — R0602 Shortness of breath: Secondary | ICD-10-CM | POA: Diagnosis not present

## 2020-02-09 DIAGNOSIS — I729 Aneurysm of unspecified site: Secondary | ICD-10-CM

## 2020-02-09 HISTORY — PX: ANEURYSM COILING: SHX5349

## 2020-02-09 HISTORY — DX: Aneurysm of unspecified site: I72.9

## 2020-02-09 MED ORDER — IOHEXOL 350 MG/ML SOLN
80.0000 mL | Freq: Once | INTRAVENOUS | Status: AC | PRN
  Administered 2020-02-09: 80 mL via INTRAVENOUS

## 2020-02-09 MED ORDER — NITROGLYCERIN 0.4 MG SL SUBL
SUBLINGUAL_TABLET | SUBLINGUAL | Status: AC
  Filled 2020-02-09: qty 2

## 2020-02-09 MED ORDER — NITROGLYCERIN 0.4 MG SL SUBL
0.8000 mg | SUBLINGUAL_TABLET | Freq: Once | SUBLINGUAL | Status: AC
  Administered 2020-02-09: 0.8 mg via SUBLINGUAL

## 2020-02-09 NOTE — Progress Notes (Signed)
CT scan completed. Tolerated well. D/C home ambulatory, awake and alert. In no distress. 

## 2020-02-25 ENCOUNTER — Other Ambulatory Visit: Payer: Self-pay

## 2020-02-25 ENCOUNTER — Ambulatory Visit (HOSPITAL_COMMUNITY): Payer: Managed Care, Other (non HMO) | Attending: Internal Medicine

## 2020-02-25 DIAGNOSIS — R0602 Shortness of breath: Secondary | ICD-10-CM | POA: Diagnosis present

## 2020-02-25 LAB — ECHOCARDIOGRAM COMPLETE
Area-P 1/2: 2.99 cm2
S' Lateral: 2.9 cm

## 2020-02-28 DIAGNOSIS — Z9889 Other specified postprocedural states: Secondary | ICD-10-CM | POA: Insufficient documentation

## 2020-03-11 DIAGNOSIS — M341 CR(E)ST syndrome: Secondary | ICD-10-CM

## 2020-03-11 HISTORY — DX: Cr(e)st syndrome: M34.1

## 2020-04-06 LAB — CBC: RBC: 4.18 (ref 3.87–5.11)

## 2020-04-08 LAB — CBC AND DIFFERENTIAL
HCT: 42 (ref 36–46)
Hemoglobin: 13.3 (ref 12.0–16.0)
Platelets: 249 (ref 150–399)
WBC: 10.7

## 2020-04-18 ENCOUNTER — Telehealth (INDEPENDENT_AMBULATORY_CARE_PROVIDER_SITE_OTHER): Payer: Managed Care, Other (non HMO) | Admitting: Family Medicine

## 2020-04-18 DIAGNOSIS — M0579 Rheumatoid arthritis with rheumatoid factor of multiple sites without organ or systems involvement: Secondary | ICD-10-CM | POA: Diagnosis not present

## 2020-04-18 DIAGNOSIS — R5382 Chronic fatigue, unspecified: Secondary | ICD-10-CM

## 2020-04-18 DIAGNOSIS — Z9889 Other specified postprocedural states: Secondary | ICD-10-CM | POA: Diagnosis not present

## 2020-04-18 DIAGNOSIS — I671 Cerebral aneurysm, nonruptured: Secondary | ICD-10-CM

## 2020-04-18 DIAGNOSIS — M19049 Primary osteoarthritis, unspecified hand: Secondary | ICD-10-CM

## 2020-04-18 NOTE — Progress Notes (Signed)
LVM advising pt that I was calling to do her prescreening prior to her my chart visit   

## 2020-04-18 NOTE — Progress Notes (Signed)
Virtual Visit via Video Note  I connected with Dawn Griffin on 04/19/20 at  8:10 AM EST by a video enabled telemedicine application and verified that I am speaking with the correct person using two identifiers.   I discussed the limitations of evaluation and management by telemedicine and the availability of in person appointments. The patient expressed understanding and agreed to proceed.  Patient location: at home.  Provider location: in office     Established Patient Office Visit  Subjective:  Patient ID: Dawn Griffin, female    DOB: 1970/12/15  Age: 50 y.o. MRN: [id number]  CC:  Chief Complaint  Patient presents with   Follow-up    HPI   Dawn Griffin presents for f/u  She just wanted to give me some updates and discuss a couple things today.  She has been following with Dr. Jerene Pitch for her rheumatoid arthritis, positive ANA, Raynaud's, and chronic fatigue.  She started Orencia infusion. Recently on prednisone and has really helped her joint pain and swelling. . Had previously tried the Enbrel.  She says she really likes Dr. Jerene Pitch and feels like he is done a lot to really try to help her.  But she is frustrated that she really does not feel like she can communicate well with him.  She would like to consider a second opinion as well.  She feels like she always responds really well to prednisone and wants to know why she could not continue to take it more long-term it provides a normal surround of relief as far as pain and swelling in her joints.  It also just gives her more energy and she feels better.  She has stopped her Cymbalta as well.  She feels like the skin on her hands is getting more thick.  There was a discussion at one point of possible scleroderma.  Again she has a history of Raynaud's..    Follow up chronic fatigue-she had held the Adderall bc of the aneurysm but has restarted it a few times.  In fact she actually took the extended release and actually felt like it  was a little too much.  It impacted her sleep quality.  Also wanted to give me an update on the cerebral aneurysm-she did have surgery and everything went really well.  No past medical history on file.  Past Surgical History:  Procedure Laterality Date   LUMBAR SPINE SURGERY  2001   L4-5-S1   TONSILLECTOMY  1990    Family History  Problem Relation Age of Onset   Stomach cancer Paternal Grandmother    Bipolar disorder Mother     Social History   Socioeconomic History   Marital status: Married    Spouse name: Not on file   Number of children: Not on file   Years of education: Not on file   Highest education level: Not on file  Occupational History   Not on file  Tobacco Use   Smoking status: Current Every Day Smoker    Packs/day: 0.30    Types: Cigarettes   Smokeless tobacco: Never Used  Substance and Sexual Activity   Alcohol use: Yes    Alcohol/week: 1.0 standard drink    Types: 1 Standard drinks or equivalent per week   Drug use: No   Sexual activity: Yes    Partners: Male  Other Topics Concern   Not on file  Social History Narrative   No exercise. + daily caffeine.    Social Determinants of Health   Financial  Resource Strain: Not on file  Food Insecurity: Not on file  Transportation Needs: Not on file  Physical Activity: Not on file  Stress: Not on file  Social Connections: Not on file  Intimate Partner Violence: Not on file    Outpatient Medications Prior to Visit  Medication Sig Dispense Refill   amphetamine-dextroamphetamine (ADDERALL XR) 15 MG 24 hr capsule Take 1 capsule by mouth daily. (Patient not taking: Reported on 01/28/2020) 30 capsule 0   amphetamine-dextroamphetamine (ADDERALL) 15 MG tablet Take 1 tablet by mouth daily. after lunch (Patient not taking: Reported on 01/28/2020) 30 tablet 0   b complex vitamins tablet Take by mouth.     Cats Claw, Uncaria tomentosa, (CATS CLAW PO) Take by mouth.     folic acid (FOLVITE) 1  MG tablet      hydroxychloroquine (PLAQUENIL) 200 MG tablet Take 200 mg by mouth 2 (two) times daily.     loperamide (IMODIUM) 2 MG capsule Take by mouth as needed for diarrhea or loose stools.     methotrexate (RHEUMATREX) 2.5 MG tablet once a week. 6 tablets weekly     metoprolol tartrate (LOPRESSOR) 100 MG tablet Take 1 tablet (100 mg total) by mouth once for 1 dose. Take one tablet 2 hours before your CT scan 1 tablet 0   DULoxetine (CYMBALTA) 60 MG capsule TAKE 1 CAPSULE DAILY 90 capsule 3   No facility-administered medications prior to visit.    No Known Allergies  ROS Review of Systems    Objective:    Physical Exam  There were no vitals taken for this visit. Wt Readings from Last 3 Encounters:  01/28/20 126 lb (57.2 kg)  12/23/19 125 lb (56.7 kg)  10/05/19 128 lb (58.1 kg)     Health Maintenance Due  Topic Date Due   Hepatitis C Screening  Never done   HIV Screening  Never done    There are no preventive care reminders to display for this patient.  Lab Results  Component Value Date   TSH 0.89 06/25/2018   Lab Results  Component Value Date   WBC 7.2 06/25/2018   HGB 13.0 06/25/2018   HCT 39.1 06/25/2018   MCV 92.4 06/25/2018   PLT 247 06/25/2018   Lab Results  Component Value Date   NA 136 06/25/2018   K 4.3 06/25/2018   CO2 29 06/25/2018   GLUCOSE 88 06/25/2018   BUN 8 06/25/2018   CREATININE 0.71 06/25/2018   BILITOT 0.5 06/25/2018   ALKPHOS 38 03/18/2016   AST 29 06/25/2018   ALT 32 (H) 06/25/2018   PROT 6.7 06/25/2018   ALBUMIN 4.0 03/18/2016   CALCIUM 9.8 06/25/2018   Lab Results  Component Value Date   CHOL 177 05/27/2019   Lab Results  Component Value Date   HDL 59 05/27/2019   Lab Results  Component Value Date   LDLCALC 91 05/27/2019   Lab Results  Component Value Date   TRIG 171 (H) 05/27/2019   Lab Results  Component Value Date   CHOLHDL 3.0 05/27/2019   No results found for: HGBA1C    Assessment & Plan:    Problem List Items Addressed This Visit      Musculoskeletal and Integument   Rheumatoid arthritis ([place]) - Primary    Discussed that she feels like her current rheumatologist has done a good job in taking care of her but is really struggling with communication and feeling like her questions are being answered and feels it is  impacting her care. Will place referral.        Relevant Orders   Ambulatory referral to Rheumatology   Arthritis of left carpometacarpal joint    Failed surgery. Now has decreased ROM.         Other   S/P coil embolization of cerebral aneurysm    She did well with surgery. Ok to restart her Adderall.        Chronic fatigue    We discussed we can always decrease the extended release Adderall dose especially if she felt like it is too strong.  Previously she been taking it very regularly so had really built up to that dose.  She can always just use her short acting for now if needed.  I am happy to send in refills when needed.       Other Visit Diagnoses    Cerebral aneurysm          No orders of the defined types were placed in this encounter.   Follow-up: No follow-ups on file.      Time spent in encounter 25 minutes  I discussed the assessment and treatment plan with the patient. The patient was provided an opportunity to ask questions and all were answered. The patient agreed with the plan and demonstrated an understanding of the instructions.   The patient was advised to call back or seek an in-person evaluation if the symptoms worsen or if the condition fails to improve as anticipated.   Beatrice Lecher, MD

## 2020-04-18 NOTE — Assessment & Plan Note (Addendum)
Discussed that she feels like her current rheumatologist has done a good job in taking care of her but is really struggling with communication and feeling like her questions are being answered and feels it is impacting her care. Will place referral.

## 2020-04-19 ENCOUNTER — Encounter: Payer: Self-pay | Admitting: Family Medicine

## 2020-04-19 NOTE — Assessment & Plan Note (Signed)
Failed surgery. Now has decreased ROM.

## 2020-04-19 NOTE — Assessment & Plan Note (Signed)
She did well with surgery. Ok to restart her Adderall.

## 2020-04-19 NOTE — Assessment & Plan Note (Signed)
We discussed we can always decrease the extended release Adderall dose especially if she felt like it is too strong.  Previously she been taking it very regularly so had really built up to that dose.  She can always just use her short acting for now if needed.  I am happy to send in refills when needed.

## 2020-05-24 NOTE — Progress Notes (Signed)
Office Visit Note  Patient: Dawn Griffin             Date of Birth: 02-21-1971           MRN: [id number]             PCP: Hali Marry, MD Referring: Hali Marry, * Visit Date: 05/25/2020   Subjective:  New Patient (Initial Visit) (Patient is currently taking MTX, PLQ, and Orencia. )  History of Present Illness: Dawn Griffin is a 50 y.o. female with history of carotid artery aneurysm and centrilobular emphysema here for evaluation of rheumatoid arthritis currently on methotrexate and orencia infusion. 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 She was first evaluated with rheumatology around 2014 due to hand pain, stiffness, skin changes, raynaud's, telangiectasias but not started any long term treatments until in 2018 with worsening symptoms was diagnosed with RA. She takes methotrexate and hydroxychloroquine for over a year without significant improvement in symptoms, mostly only feeling improvement when on low dose prednisone. Enbrel was added to her treatment but she does not feel this made a particularly good benefit in symptoms. She start Orencia infusion IV since about 2 months ago and also has not seen much improvement yet, although tolerating the treatment okay. She does not have much joint swelling most of the time, although in the past when she stopped all medicines for a time large bilateral knee swelling occurred and also hand swelling involving the whole hand and fingers. Doing okay today, currently on 10 mg prednisone she restarted taking it at 20 mg dose and decreased it feeling a bit worse but currently much better than off the medicine.  She had extensive cardiac workup in December with abnormal EKG and subsequent TTE without any obvious problems and cardiac CT checked with no coronary disease problems seen. She was also found to have carotid artery aneurysm during evaluation for vagal nerve  stimulator placement as a participant with RESET-RA trial.  She has skin thickening and Raynaud's numerous telangiectasias on hands bilaterally.  She is not currently on any medicine for this.  She has a few areas of small pitting or scarring.  She previously did not try calcium channel blocker due to concern of side effects with a fairly low baseline blood pressure.  She has chronic diarrhea symptoms has had colonoscopy that was unremarkable.  She is also had swallowing study but does not describe prior upper GI endoscopy.  She has pulmonary emphysema but with good function on PFTs and is following up regularly with pulmonology.  This has been attributed to prior smoking history which she has to reduce but not stopped entirely.  She does have pretty frequent shortness of breath with exertion but no history of hypoxia or abnormal stress testing or PFTs.  She does not notice significant peripheral edema.  Labs reviewed 02/2020 CBC wnl CMP wnl Quantiferon neg ESR 5 CRP 4  04/2016 ANA 1:1280 centromere RF 16.3 CCP 117   01/2013 ANA 1:160 centromere RF neg CCP neg  Imaging reviewed 02/2020 Cardiac CT 1. Coronary calcium score of 0. This was 0 percentile for age and sex matched control. 2. Normal coronary origin with right dominance. 3. CAD-RADS 0. No evidence of CAD (0%). Consider non-atherosclerotic causes of chest pain.  TTE Normal EF, tricuspid jet inadequate for estimation of RVSP   Activities of Daily Living:  Patient reports morning stiffness for 30 minutes.   Patient Denies nocturnal pain.  Difficulty dressing/grooming: Denies Difficulty climbing stairs: Denies Difficulty getting out of chair: Denies Difficulty using hands for taps, buttons, cutlery, and/or writing: Reports  Review of Systems  Constitutional: Positive for fatigue.  HENT: Positive for mouth dryness. Negative for mouth sores and nose dryness.   Eyes: Negative for pain, itching, visual disturbance and  dryness.  Respiratory: Positive for shortness of breath and difficulty breathing. Negative for cough and hemoptysis.   Cardiovascular: Positive for chest pain and palpitations. Negative for swelling in legs/feet.  Gastrointestinal: Positive for abdominal pain and diarrhea. Negative for blood in stool and constipation.  Endocrine: Negative for increased urination.  Genitourinary: Negative for painful urination.  Musculoskeletal: Positive for arthralgias, joint pain, joint swelling and morning stiffness. Negative for myalgias, muscle weakness, muscle tenderness and myalgias.  Skin: Positive for color change, rash and redness.  Allergic/Immunologic: Negative for susceptible to infections.  Neurological: Positive for dizziness, numbness, headaches and memory loss. Negative for weakness.  Hematological: Negative for swollen glands.  Psychiatric/Behavioral: Positive for confusion. Negative for sleep disturbance.    PMFS History:  Patient Active Problem List   Diagnosis Date Noted  . High risk medication use 05/25/2020  . Vitamin D deficiency 05/25/2020  . S/P coil embolization of cerebral aneurysm 02/28/2020  . Brain aneurysm 02/01/2020  . Centrilobular emphysema ([place]) 08/04/2019  . Chronic fatigue 05/10/2019  . Brain fog 05/10/2019  . Myofascial pain 06/30/2018  . Rheumatoid arthritis ([place]) 05/20/2017  . Bilateral hand numbness 08/17/2013  . 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 SOB (shortness of breath) 02/26/2013  . Positive ANA (antinuclear antibody) 01/28/2013  . Raynaud's phenomenon 01/28/2013  . ANEMIA, HX OF 01/10/2010  . URINARY URGENCY 12/21/2009  . LYMPHADENOPATHY 08/15/2008    Past Medical History:  Diagnosis Date  . Aneurysm ([place])   . Raynaud disease   . Rheumatoid arthritis ([place])     Family History  Problem Relation Age of Onset  . Stomach cancer Paternal Grandmother   . Bipolar disorder Mother   . Lung cancer Mother   . Lung cancer Father   .  Healthy Sister   . Healthy Brother   . Lupus Sister    Past Surgical History:  Procedure Laterality Date  . ANEURYSM COILING  02/2020  . FINGER ARTHROSCOPY WITH CARPOMETACARPEL ([facility]) ARTHROPLASTY Left 03/2019  . LUMBAR SPINE SURGERY  2001   [place]  . VAGUS NERVE STIMULATOR INSERTION  09/2019   Social History   Social History Narrative   No exercise. + daily caffeine.    Immunization History  Administered Date(s) Administered  . Influenza Whole 01/10/2010  . Tdap 01/11/2013     Objective: Vital Signs: BP 118/80 (BP Location: Right Arm, Patient Position: Sitting, Cuff Size: Normal)   Pulse 88   Ht 5' 3.5" (1.613 m)   Wt 125 lb 6.4 oz (56.9 kg)   BMI 21.87 kg/m    Physical Exam HENT:     Right Ear: External ear normal.     Left Ear: External ear normal.     Mouth/Throat:     Mouth: Mucous membranes are moist.     Pharynx: Oropharynx is clear.  Eyes:     Conjunctiva/sclera: Conjunctivae normal.  Cardiovascular:     Rate and Rhythm: Normal rate and regular rhythm.  Pulmonary:     Effort: Pulmonary effort is normal.     Breath sounds: Normal breath sounds.  Skin:    General: Skin is warm and dry.  Comments: Skin thickening on fingers of both hands to at least PIPs and MCPs, telangiectasias present on the face and arms and most numerous on the fingers of both hands Small healing scab depends on digital finger pads on both hands, hyperkeratotic changes on second digit  Neurological:     General: No focal deficit present.     Mental Status: She is alert.     Deep Tendon Reflexes: Reflexes normal.  Psychiatric:        Mood and Affect: Mood normal.     Musculoskeletal Exam:  Neck full ROM no tenderness Shoulders full ROM no tenderness or swelling Elbows full ROM no tenderness or swelling Wrists full ROM no tenderness or swelling Fingers tight with slight restricted flexion but able to close completely, subluxation of left first CMC  joint Knees full ROM no tenderness or swelling Ankles full ROM no tenderness or swelling MTPs full ROM no tenderness or swelling   Investigation: No additional findings.  Imaging: No results found.  Recent Labs: Lab Results  Component Value Date   WBC 7.2 06/25/2018   HGB 13.0 06/25/2018   PLT 247 06/25/2018   NA 136 06/25/2018   K 4.3 06/25/2018   CL 103 06/25/2018   CO2 29 06/25/2018   GLUCOSE 88 06/25/2018   BUN 8 06/25/2018   CREATININE 0.71 06/25/2018   BILITOT 0.5 06/25/2018   ALKPHOS 38 03/18/2016   AST 29 06/25/2018   ALT 32 (H) 06/25/2018   PROT 6.7 06/25/2018   ALBUMIN 4.0 03/18/2016   CALCIUM 9.8 06/25/2018   GFRAA 119 06/25/2018    Speciality Comments: No specialty comments available.  Procedures:  No procedures performed Allergies: Patient has no known allergies.   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 Currently symptoms look controlled but is on prednisone 10 mg at this time. Checking markers for disease activity and drug monitoring but otherwise continue current regimen for now methotrexate 15 mg PO weekly, HCQ down to 300 mg PO daily for body mass 56.9 kg, prednisone 10 mg recommend trying to taper down to 5 mg when feeling well or after 1 month, Orencia can convert for [place], next infusion would otherwies be scheduled 4/09, folic acid 1 mg.  Raynaud's phenomenon without gangrene - Plan: amLODipine (NORVASC) 5 MG tablet  Secondary raynaud's with associated sclerodactyly and multiple healing finger lesions. Has not tolerated topical nitrates. She does take adderall which can worsened peripheral  vasoconstriction. Recommend trial of amlodipine 5 mg daily for symptoms, discussed possible side effects hypotension, edema, headache, dizziness.  Centrilobular emphysema ([place])  She has COPD but normal pulmonary functions and no history of frequent exacerbations. No evidence of pulmonary HTN or ILD on recent workups. Discussed [name] is associated with potential increase risk for exacerbations but is very stable currently.  High risk medication use - Plan: CBC with Differential/Platelet, COMPLETE METABOLIC PANEL WITH GFR, Hepatitis panel, acute, Ambulatory referral to Ophthalmology   Checking CBC, CMP, hepatitis panel for medication monitoring. Her recent labs from 02/2020 include negative TB test. Discussed need for annual ophthalmology exam for hydroxychloroquine toxicity screening she has been on it at least 2 years.  Vitamin D deficiency - Plan: VITAMIN D 25 Hydroxy (  Vit-D Deficiency, Fractures)  Suspected diagnosis and screening for glucocorticoid induced osteopenia risk with long term use of prednisone exposure.  Orders: Orders Placed This Encounter  Procedures  . CBC with Differential/Platelet  . COMPLETE METABOLIC PANEL WITH GFR  . Hepatitis panel, acute  . Sedimentation rate  . VITAMIN D 25 Hydroxy (Vit-D Deficiency, Fractures)  . Ambulatory referral to Ophthalmology   Meds ordered this encounter  Medications  . amLODipine (NORVASC) 5 MG tablet    Sig: Take 1 tablet (5 mg total) by mouth daily.    Dispense:  30 tablet    Refill:  1  . predniSONE (DELTASONE) 10 MG tablet    Sig: Take 1 tablet (10 mg total) by mouth daily with breakfast.    Dispense:  30 tablet    Refill:  1  . methotrexate (RHEUMATREX) 2.5 MG tablet    Sig: Take 6 tablets (15 mg total) by mouth once a week. 6 tablets weekly    Dispense:  30 tablet    Refill:  1  . hydroxychloroquine (PLAQUENIL) 200 MG tablet    Sig: Take 1.5 tablets (300 mg total) by mouth daily.    Dispense:  45 tablet    Refill:  1     Follow-Up Instructions: Return in about 6 weeks (around 07/06/2020).   Collier Salina, MD  Note - This record has been created using [employer].  Chart creation errors have been sought, but may not always  have been located. Such creation errors do not reflect on  the standard of medical care.

## 2020-05-25 ENCOUNTER — Telehealth: Payer: Self-pay

## 2020-05-25 ENCOUNTER — Encounter: Payer: Self-pay | Admitting: Internal Medicine

## 2020-05-25 ENCOUNTER — Ambulatory Visit: Payer: Managed Care, Other (non HMO) | Admitting: Internal Medicine

## 2020-05-25 ENCOUNTER — Other Ambulatory Visit: Payer: Self-pay

## 2020-05-25 VITALS — BP 118/80 | HR 88 | Ht 63.5 in | Wt 125.4 lb

## 2020-05-25 DIAGNOSIS — Z79899 Other long term (current) drug therapy: Secondary | ICD-10-CM

## 2020-05-25 DIAGNOSIS — M0579 Rheumatoid arthritis with rheumatoid factor of multiple sites without organ or systems involvement: Secondary | ICD-10-CM

## 2020-05-25 DIAGNOSIS — I7300 Raynaud's syndrome without gangrene: Secondary | ICD-10-CM

## 2020-05-25 DIAGNOSIS — E559 Vitamin D deficiency, unspecified: Secondary | ICD-10-CM

## 2020-05-25 DIAGNOSIS — J432 Centrilobular emphysema: Secondary | ICD-10-CM | POA: Diagnosis not present

## 2020-05-25 DIAGNOSIS — R200 Anesthesia of skin: Secondary | ICD-10-CM

## 2020-05-25 MED ORDER — METHOTREXATE 2.5 MG PO TABS: 15.0000 mg | ORAL_TABLET | ORAL | 1 refills | Status: AC

## 2020-05-25 MED ORDER — HYDROXYCHLOROQUINE SULFATE 200 MG PO TABS: 300.0000 mg | ORAL_TABLET | Freq: Every day | ORAL | 1 refills | Status: AC

## 2020-05-25 MED ORDER — AMLODIPINE BESYLATE 5 MG PO TABS: 5.0000 mg | ORAL_TABLET | Freq: Every day | ORAL | 1 refills | Status: DC

## 2020-05-25 MED ORDER — PREDNISONE 10 MG PO TABS: 10.0000 mg | ORAL_TABLET | Freq: Every day | ORAL | 1 refills | Status: DC

## 2020-05-25 NOTE — Telephone Encounter (Signed)
Please contact patient when Orencia approved, patient has infusion 06/06/2020 w/Dr.Mishra, rheumatologist.

## 2020-05-25 NOTE — Patient Instructions (Addendum)
I recommend continuing current medications at this time. I recommend tapering prednisone down to 5 mg dose after a month or sooner if feeling particularly well and can stay on that low dose for now while waiting for orencia clinical response.  Start amlodipine 5 mg by mouth once daily for raynaud's symptoms.  Continue methotrexate 15 mg by mouth weekly and folic acid 1 mg daily.  Continue hydroxychloroquine, your dose should be reduced according to body weight so 1.5 tablets total per day, not 2 tablets.

## 2020-05-25 NOTE — Telephone Encounter (Signed)
She can continue on the already picked up medications in that case, besides the amlodipine. We will be following up in less than 90 days. I did not have her prescriptions for these in our system and did not want a lapse in treatment. She should reduce the dose of hydroxychloroquine though to 1.5 tablets per day but pills are the same.

## 2020-05-25 NOTE — Telephone Encounter (Signed)
I called patient, patient verbalized understanding. 

## 2020-05-25 NOTE — Telephone Encounter (Signed)
Patient called stating Dr. Benjamine Mola sent in prescriptions to [facility] for Amlodipine, Plaquenil, Methotrexate, and Prednisone.  Patient states she just picked up 90 day supply for Plaquenil, Methotrexate, and Prednisone medications.  The only medication she needed refilled was Amlodipine.

## 2020-05-25 NOTE — Telephone Encounter (Signed)
FYI, please advise.  Thank you!

## 2020-05-26 ENCOUNTER — Telehealth: Payer: Self-pay | Admitting: Pharmacist

## 2020-05-26 DIAGNOSIS — M0579 Rheumatoid arthritis with rheumatoid factor of multiple sites without organ or systems involvement: Secondary | ICD-10-CM

## 2020-05-26 LAB — CBC WITH DIFFERENTIAL/PLATELET
Absolute Monocytes: 348 cells/uL (ref 200–950)
Basophils Absolute: 35 cells/uL (ref 0–200)
Basophils Relative: 0.3 %
Eosinophils Absolute: 46 cells/uL (ref 15–500)
Eosinophils Relative: 0.4 %
HCT: 40.6 % (ref 35.0–45.0)
Hemoglobin: 13.5 g/dL (ref 11.7–15.5)
Lymphs Abs: 1972 cells/uL (ref 850–3900)
MCH: 32.1 pg (ref 27.0–33.0)
MCHC: 33.3 g/dL (ref 32.0–36.0)
MCV: 96.7 fL (ref 80.0–100.0)
MPV: 10.3 fL (ref 7.5–12.5)
Monocytes Relative: 3 %
Neutro Abs: 9199 cells/uL — ABNORMAL HIGH (ref 1500–7800)
Neutrophils Relative %: 79.3 %
Platelets: 257 10*3/uL (ref 140–400)
RBC: 4.2 10*6/uL (ref 3.80–5.10)
RDW: 13.1 % (ref 11.0–15.0)
Total Lymphocyte: 17 %
WBC: 11.6 10*3/uL — ABNORMAL HIGH (ref 3.8–10.8)

## 2020-05-26 LAB — HEPATITIS PANEL, ACUTE
Hep A IgM: NONREACTIVE
Hep B C IgM: NONREACTIVE
Hepatitis B Surface Ag: NONREACTIVE
Hepatitis C Ab: NONREACTIVE
SIGNAL TO CUT-OFF: 0.01 (ref <1.00)

## 2020-05-26 LAB — COMPLETE METABOLIC PANEL WITH GFR
AG Ratio: 2 (calc) (ref 1.0–2.5)
ALT: 18 U/L (ref 6–29)
AST: 16 U/L (ref 10–35)
Albumin: 4.3 g/dL (ref 3.6–5.1)
Alkaline phosphatase (APISO): 31 U/L (ref 31–125)
BUN: 13 mg/dL (ref 7–25)
CO2: 27 mmol/L (ref 20–32)
Calcium: 10.1 mg/dL (ref 8.6–10.2)
Chloride: 104 mmol/L (ref 98–110)
Creat: 0.92 mg/dL (ref 0.50–1.10)
GFR, Est African American: 86 mL/min/{1.73_m2} (ref >=60)
GFR, Est Non African American: 74 mL/min/{1.73_m2} (ref >=60)
Globulin: 2.2 g/dL (calc) (ref 1.9–3.7)
Glucose, Bld: 89 mg/dL (ref 65–99)
Potassium: 4.2 mmol/L (ref 3.5–5.3)
Sodium: 139 mmol/L (ref 135–146)
Total Bilirubin: 0.4 mg/dL (ref 0.2–1.2)
Total Protein: 6.5 g/dL (ref 6.1–8.1)

## 2020-05-26 LAB — SEDIMENTATION RATE: Sed Rate: 6 mm/h (ref 0–20)

## 2020-05-26 LAB — VITAMIN D 25 HYDROXY (VIT D DEFICIENCY, FRACTURES): Vit D, 25-Hydroxy: 23 ng/mL — ABNORMAL LOW (ref 30–100)

## 2020-05-26 NOTE — Telephone Encounter (Addendum)
Please start Orencia SQ Clickject BIV.  Patient receiving Orencia infusion on 06/06/20  Dose: 125 SQ once weekly  Dx: M05.79 (RA with positive RA factor)  Previous therapies: Enbrel (inadequate response)  Currently taking: MTX 15mg  weekly and hydroxychloroquine

## 2020-05-26 NOTE — Telephone Encounter (Signed)
Submitted a Prior Authorization request to [employer] for [employer] via Cover My Meds. Will update once we receive a response.   [id number] - PA Case ID: [id number]

## 2020-05-26 NOTE — Telephone Encounter (Signed)
Orencia SQ Clickject BIV started as separate phone encounter. Will f/u

## 2020-05-26 NOTE — Progress Notes (Signed)
Dawn Griffin labs look okay her white blood cell count is slightly high this is probably a side effect caused by the prednisone. Her vitamin D is low, if she is not taking a supplement she needs to start or if taking one she needs to increase the amount, by 1000 units daily. Her arthritis plus steroid use are major risk factors for osteoporosis and future bone fractures. We will apply for starting the injectable orencia.

## 2020-05-29 MED ORDER — ORENCIA CLICKJECT 125 MG/ML ~~LOC~~ SOAJ: 125.0000 mg | SUBCUTANEOUS | 1 refills | Status: DC

## 2020-05-29 NOTE — Telephone Encounter (Signed)
Rx for Orencia 125mg  SQ weekly sent to [facility]. Patient signed up for copay card while on phone with me. She will provide this information to pharmacy when scheduling shipment.  Discussed that she should start medicine on 06/06/20, when next infusion is scheduled. Patient verbalized understanding.  Patient needs f/u ~6 weeks per last OV with Dr. Benjamine Mola. Will route to scheduling team  Knox Saliva, PharmD, MPH Clinical Pharmacist (Rheumatology and Pulmonology)

## 2020-05-29 NOTE — Telephone Encounter (Signed)
Received notification from [facility] regarding a prior authorization for [facility]. Authorization has been APPROVED from 05/26/20 to 05/26/21.   Authorization # [id number]   Per plan, patient must fill through [facility]. Patient can enroll for Orencia copay card.

## 2020-05-29 NOTE — Telephone Encounter (Signed)
Thanks

## 2020-05-29 NOTE — Telephone Encounter (Signed)
I LMOM for patient to call to schedule a follow up appointment in 6 weeks (around 07/06/2020).

## 2020-06-07 ENCOUNTER — Encounter: Payer: Self-pay | Admitting: Family Medicine

## 2020-06-16 ENCOUNTER — Ambulatory Visit
Admission: RE | Admit: 2020-06-16 | Discharge: 2020-06-16 | Disposition: A | Discharge status: Home / Self Care | Payer: Managed Care, Other (non HMO) | Source: Ambulatory Visit | Attending: Family Medicine | Admitting: Family Medicine

## 2020-06-16 ENCOUNTER — Other Ambulatory Visit: Payer: Self-pay

## 2020-06-16 DIAGNOSIS — N6320 Unspecified lump in the left breast, unspecified quadrant: Secondary | ICD-10-CM

## 2020-06-19 ENCOUNTER — Encounter: Payer: Self-pay | Admitting: Family Medicine

## 2020-06-19 DIAGNOSIS — R4189 Other symptoms and signs involving cognitive functions and awareness: Secondary | ICD-10-CM

## 2020-06-19 DIAGNOSIS — R5382 Chronic fatigue, unspecified: Secondary | ICD-10-CM

## 2020-06-23 LAB — CBC: RBC: 4.35 (ref 3.87–5.11)

## 2020-06-23 LAB — CBC AND DIFFERENTIAL
HCT: 42 (ref 36–46)
Hemoglobin: 14.4 (ref 12.0–16.0)
Neutrophils Absolute: 9972
Platelets: 324 (ref 150–399)
WBC: 11.9

## 2020-06-27 MED ORDER — AMPHETAMINE-DEXTROAMPHETAMINE 15 MG PO TABS: 15.0000 mg | ORAL_TABLET | Freq: Two times a day (BID) | ORAL | 0 refills | Status: DC

## 2020-06-27 NOTE — Telephone Encounter (Signed)
Regular adderall 15mg  sent for BID dosing.

## 2020-07-06 ENCOUNTER — Other Ambulatory Visit: Payer: Self-pay

## 2020-07-06 ENCOUNTER — Encounter: Payer: Self-pay | Admitting: Internal Medicine

## 2020-07-06 ENCOUNTER — Ambulatory Visit: Payer: Managed Care, Other (non HMO) | Admitting: Internal Medicine

## 2020-07-06 VITALS — BP 113/74 | HR 91 | Ht 64.0 in | Wt 124.6 lb

## 2020-07-06 DIAGNOSIS — I7300 Raynaud's syndrome without gangrene: Secondary | ICD-10-CM | POA: Diagnosis not present

## 2020-07-06 DIAGNOSIS — M0579 Rheumatoid arthritis with rheumatoid factor of multiple sites without organ or systems involvement: Secondary | ICD-10-CM

## 2020-07-06 DIAGNOSIS — Z79899 Other long term (current) drug therapy: Secondary | ICD-10-CM

## 2020-07-06 DIAGNOSIS — I7301 Raynaud's syndrome with gangrene: Secondary | ICD-10-CM

## 2020-07-06 DIAGNOSIS — K219 Gastro-esophageal reflux disease without esophagitis: Secondary | ICD-10-CM

## 2020-07-06 DIAGNOSIS — M341 CR(E)ST syndrome: Secondary | ICD-10-CM | POA: Diagnosis not present

## 2020-07-06 MED ORDER — SILDENAFIL CITRATE 20 MG PO TABS: 20.0000 mg | ORAL_TABLET | Freq: Two times a day (BID) | ORAL | 1 refills | Status: DC

## 2020-07-06 NOTE — Progress Notes (Signed)
Office Visit Note  Patient: Dawn Griffin             Date of Birth: [birth year]           MRN: [id number]             PCP: Hali Marry, MD Referring: Hali Marry, * Visit Date: 07/06/2020   Subjective:  Follow-up (Patient complains of increased pain since coming off Prednisone. )   History of Present Illness: Dawn Griffin is a 50 y.o. female here for follow up for RA/CREST overlap syndrome currently on HCQ 300 mg daily, methotrexate 15 mg PO weekly, and orencia  weekly. She discontinued taking the prednisone since has last visit after repeated recommendation to come off this. She had recent labs checked with CBC associated with participation in the RESET-RA trial showing mildly high WBCs and hsCRP. She feels some increase in all over joint pains off the prednisone although no flare up of swelling changes. She notices frequent raynaud's symptoms and increased pitting lesions in her fingers. She also describes continued GERD symptoms.   Review of Systems  Constitutional: Positive for fatigue.  HENT: Positive for sore tongue. Negative for mouth sores, mouth dryness and nose dryness.   Eyes: Positive for visual disturbance. Negative for pain, itching and dryness.  Respiratory: Positive for shortness of breath and difficulty breathing. Negative for cough and hemoptysis.   Cardiovascular: Negative for chest pain, palpitations and swelling in legs/feet.  Gastrointestinal: Positive for abdominal pain and diarrhea. Negative for blood in stool and constipation.  Endocrine: Negative for increased urination.  Genitourinary: Negative for painful urination.  Musculoskeletal: Positive for arthralgias, joint pain, joint swelling, myalgias, morning stiffness and myalgias. Negative for muscle weakness and muscle tenderness.  Skin: Positive for color change and redness. Negative for rash.  Allergic/Immunologic: Negative for susceptible to infections.  Neurological: Positive for  dizziness, numbness and weakness. Negative for headaches and memory loss.  Hematological: Negative for swollen glands.  Psychiatric/Behavioral: Negative for confusion and sleep disturbance.     Previous HPI: Dawn Griffin is a 50 y.o. female with history of carotid artery aneurysm and centrilobular emphysema here for evaluation of rheumatoid arthritis currently on methotrexate and orencia infusion. 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 She was first evaluated with rheumatology around 2014 due to hand pain, stiffness, skin changes, raynaud's, telangiectasias but not started any long term treatments until in 2018 with worsening symptoms was diagnosed with RA. She takes methotrexate and hydroxychloroquine for over a year without significant improvement in symptoms, mostly only feeling improvement when on low dose prednisone. Enbrel was added to her treatment but she does not feel this made a particularly good benefit in symptoms. She start Orencia infusion IV since about 2 months ago and also has not seen much improvement yet, although tolerating the treatment okay. She does not have much joint swelling most of the time, although in the past when she stopped all medicines for a time large bilateral knee swelling occurred and also hand swelling involving the whole hand and fingers. Doing okay today, currently on 10 mg prednisone she restarted taking it at 20 mg dose and decreased it feeling a bit worse but currently much better than off the medicine.  She had extensive cardiac workup in December with abnormal EKG and subsequent TTE without any obvious problems and cardiac CT checked with no coronary disease problems seen. She was also found to have carotid artery aneurysm during  evaluation for vagal nerve stimulator placement as a participant with RESET-RA trial.  She has skin thickening and Raynaud's numerous telangiectasias on hands  bilaterally.  She is not currently on any medicine for this.  She has a few areas of small pitting or scarring.  She previously did not try calcium channel blocker due to concern of side effects with a fairly low baseline blood pressure.  She has chronic diarrhea symptoms has had colonoscopy that was unremarkable.  She is also had swallowing study but does not describe prior upper GI endoscopy.  She has pulmonary emphysema but with good function on PFTs and is following up regularly with pulmonology.  This has been attributed to prior smoking history which she has to reduce but not stopped entirely.  She does have pretty frequent shortness of breath with exertion but no history of hypoxia or abnormal stress testing or PFTs.  She does not notice significant peripheral edema.  Labs reviewed 02/2020 CBC wnl CMP wnl Quantiferon neg ESR 5 CRP 4  04/2016 ANA 1:1280 centromere RF 16.3 CCP 117   01/2013 ANA 1:160 centromere RF neg CCP neg  Imaging reviewed 02/2020 Cardiac CT 1. Coronary calcium score of 0. This was 0 percentile for age and sex matched control. 2. Normal coronary origin with right dominance. 3. CAD-RADS 0. No evidence of CAD (0%). Consider non-atherosclerotic causes of chest pain.  PMFS History:  Patient Active Problem List   Diagnosis Date Noted  . CREST syndrome ([place]) 07/06/2020  . GERD (gastroesophageal reflux disease) 07/06/2020  . High risk medication use 05/25/2020  . Vitamin D deficiency 05/25/2020  . S/P coil embolization of cerebral aneurysm 02/28/2020  . Brain aneurysm 02/01/2020  . Centrilobular emphysema ([place]) 08/04/2019  . Chronic fatigue 05/10/2019  . Brain fog 05/10/2019  . Myofascial pain 06/30/2018  . Rheumatoid arthritis ([place]) 05/20/2017  . Bilateral hand numbness 08/17/2013  . 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 SOB (shortness of breath) 02/26/2013  . Positive ANA (antinuclear antibody) 01/28/2013  . Raynaud's syndrome  with gangrene ([place]) 01/28/2013  . ANEMIA, HX OF 01/10/2010  . URINARY URGENCY 12/21/2009  . LYMPHADENOPATHY 08/15/2008    Past Medical History:  Diagnosis Date  . Aneurysm ([place])   . Raynaud disease   . Rheumatoid arthritis ([place])     Family History  Problem Relation Age of Onset  . Stomach cancer Paternal Grandmother   . Bipolar disorder Mother   . Lung cancer Mother   . Lung cancer Father   . Healthy Sister   . Healthy Brother   . Lupus Sister    Past Surgical History:  Procedure Laterality Date  . ANEURYSM COILING  02/2020  . FINGER ARTHROSCOPY WITH CARPOMETACARPEL ([facility]) ARTHROPLASTY Left 03/2019  . LUMBAR SPINE SURGERY  2001   [place]  . VAGUS NERVE STIMULATOR INSERTION  09/2019   Social History   Social History Narrative   No exercise. + daily caffeine.    Immunization History  Administered Date(s) Administered  . Influenza Whole 01/10/2010  . Tdap 01/11/2013     Objective: Vital Signs: BP 113/74 (BP Location: Right Arm, Patient Position: Sitting, Cuff Size: Normal)   Pulse 91   Ht '5\' 4"'  (1.626 m)   Wt 124 lb 9.6 oz (56.5 kg)   BMI 21.39 kg/m    Physical Exam Eyes:     Conjunctiva/sclera: Conjunctivae normal.  Pulmonary:     Effort: Pulmonary effort is normal.     Breath  sounds: Normal breath sounds.  Skin:    General: Skin is warm and dry.     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     Mental Status: She is alert.  Psychiatric:        Mood and Affect: Mood normal.    Musculoskeletal Exam:  Shoulders full ROM no tenderness or swelling Elbows full ROM no tenderness or swelling Wrists fullness bilaterally with slightly reduced ROM Fingers sclerodactyly with slightly reduced ROM no synovitis Knees  full ROM no tenderness or swelling patellofemoral crepitus b/l  Investigation: No additional findings.  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 This probably benign finding was originally described on screening mammogram dated 05/27/2019. EXAM: DIGITAL DIAGNOSTIC BILATERAL MAMMOGRAM WITH TOMOSYNTHESIS AND CAD; ULTRASOUND LEFT BREAST LIMITED TECHNIQUE: Bilateral digital diagnostic mammography and breast tomosynthesis was performed. The images were evaluated with computer-aided detection.; Targeted ultrasound examination of the left breast was performed COMPARISON:  Previous exam(s). ACR Breast Density Category c: The breast tissue is heterogeneously dense, which may obscure small masses. FINDINGS: Bilateral diagnostic mammogram: Partially obscured mass within the lower outer quadrant of the LEFT breast is not significantly changed. There are no new dominant masses, suspicious calcifications or secondary signs of malignancy elsewhere within either breast. Targeted ultrasound is performed, again showing an oval circumscribed hypoechoic mass in the LEFT breast at the 5 o'clock axis, 4 cm from the nipple, measuring 1.4 x 0.6 x 1.2 cm, not significantly changed compared to the previous exams, again most suggestive of a benign fibroadenoma. IMPRESSION: 1. 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 Recommend additional follow-up diagnostic exam in 12 months to ensure 2 year stability. 2. No evidence of malignancy within the RIGHT breast. RECOMMENDATION: Bilateral diagnostic mammogram, and LEFT breast ultrasound, in 12 months. I have discussed the findings and recommendations with the patient. If applicable, a reminder letter will be sent to the patient regarding the next appointment. BI-RADS  CATEGORY  3: Probably benign. Electronically Signed   By: Franki Cabot M.D.   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 This probably benign finding was originally described on screening mammogram dated 05/27/2019. EXAM: DIGITAL DIAGNOSTIC BILATERAL MAMMOGRAM WITH TOMOSYNTHESIS AND CAD; ULTRASOUND LEFT BREAST LIMITED TECHNIQUE: Bilateral digital diagnostic mammography and breast tomosynthesis was performed. The images were evaluated with computer-aided detection.; Targeted ultrasound examination of the left breast was performed COMPARISON:  Previous exam(s). ACR Breast Density Category c: The breast tissue is heterogeneously dense, which may obscure small masses. FINDINGS: Bilateral diagnostic mammogram: Partially obscured mass within the lower outer quadrant of the LEFT breast is not significantly changed. There are no new dominant masses, suspicious calcifications or secondary signs of malignancy elsewhere within either breast. Targeted ultrasound is performed, again showing an oval circumscribed hypoechoic mass in the LEFT breast at the 5 o'clock axis, 4 cm from the nipple, measuring 1.4 x 0.6 x 1.2 cm, not significantly changed compared to the previous exams, again most suggestive of  a benign fibroadenoma. IMPRESSION: 1. 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 Recommend additional follow-up diagnostic exam in 12 months to ensure 2 year stability. 2. No evidence of malignancy within the RIGHT breast. RECOMMENDATION: Bilateral diagnostic mammogram, and LEFT breast ultrasound, in 12 months. I have discussed the findings and recommendations with the patient. If applicable, a reminder letter will be sent to the patient regarding the next appointment. BI-RADS CATEGORY  3:  Probably benign. Electronically Signed   By: Franki Cabot M.D.   On: 06/16/2020 12:29    Recent Labs: Lab Results  Component Value Date   WBC 11.6 (H) 05/25/2020   HGB 13.5 05/25/2020   PLT 257 05/25/2020   NA 139 05/25/2020   K 4.2 05/25/2020   CL 104 05/25/2020   CO2 27 05/25/2020   GLUCOSE 89 05/25/2020   BUN 13 05/25/2020   CREATININE 0.92 05/25/2020   BILITOT 0.4 05/25/2020   ALKPHOS 38 03/18/2016   AST 16 05/25/2020   ALT 18 05/25/2020   PROT 6.5 05/25/2020   ALBUMIN 4.0 03/18/2016   CALCIUM 10.1 05/25/2020   GFRAA 86 05/25/2020    Speciality Comments: No specialty comments available.  Procedures:  No procedures performed Allergies: Patient has no known allergies.   Assessment / Plan:     Visit Diagnoses: CREST syndrome ([place]) - Plan: sildenafil (REVATIO) 20 MG tablet, Ambulatory referral to Gastroenterology  CREST syndrome with GERD, raynaud's with ischemic lesions active problems today. I also suspect a large degree of joint pains is related to the systemic sclerosis contribution. Already on amlodipine 5 mg without a great resolution of hand symptoms. Recommend starting sildenafil 20 mg BID. Continuing Orencia [place] weekly, methotrexate 15 mg PO weekly, and HCQ 300 mg PO daily. Checking Scl-70, centromere, RNA polymerase III Abs for prognostic purposes.  Rheumatoid arthritis involving multiple sites with positive rheumatoid factor (HCC) - Plan: Anti-scleroderma antibody, Centromere Antibodies, RNA Polymerase III Aby  No inflammatory changes in joints on exam today. Continuing treatment regimen as detailed above. Actively participating with RESET-RA trial.  High risk medication use  She had recent lab testing with CBC and CMP normal for methotrexate toxicity monitoring.  Gastroesophageal reflux disease, unspecified whether esophagitis present - Plan: Ambulatory referral to Gastroenterology  GERD complaints continue and with her active symptoms of CREST syndrome I  recommend GI evaluation for this. She has never had upper endoscopy or motility study that I can see and this can be a common manifestation. Referral to GI ordered.  Orders: Orders Placed This Encounter  Procedures  . Anti-scleroderma antibody  . Centromere Antibodies  . RNA Polymerase III Aby  . Ambulatory referral to Gastroenterology   Meds ordered this encounter  Medications  . sildenafil (REVATIO) 20 MG tablet    Sig: Take 1 tablet (20 mg total) by mouth in the morning and at bedtime.    Dispense:  60 tablet    Refill:  1     Follow-Up Instructions: Return in about 2 months (around 09/05/2020) for RA/CREST overlap f/u 8wks.   Collier Salina, MD  Note - This record has been created using [employer].  Chart creation errors have been sought, but may not always  have been located. Such creation errors do not reflect on  the standard of medical care.

## 2020-07-06 NOTE — Patient Instructions (Signed)
Sildenafil tablets (pulmonary hypertension) What is this medicine? SILDENAFIL (sil DEN a fil) is used to treat pulmonary arterial hypertension. This is a serious heart and lung condition. This medicine helps to improve symptoms and quality of life. This medicine may be used for other purposes; ask your health care provider or pharmacist if you have questions. COMMON BRAND NAME(S): Revatio What should I tell my health care provider before I take this medicine? They need to know if you have any of these conditions:  anatomical deformation of the penis, Peyronie's disease, or history of priapism (painful and prolonged erection)  bleeding disorders  eye disease, vision problems  heart disease  high or low blood pressure  history of blood diseases, like sickle cell anemia or leukemia  kidney disease  liver disease  pulmonary veno-occlusive disease (PVOD)  stomach ulcer  an unusual or allergic reaction to sildenafil, other medicines, foods, dyes, or preservatives  pregnant or trying to get pregnant  breast-feeding How should I use this medicine? Take this medicine by mouth with a glass of water. Follow the directions on the prescription label. You can take it with or without food. If it upsets your stomach, take it with food. Take your doses at regular intervals about 4 to 6 hours apart. Do not take it more often than directed. Do not stop taking except on your doctor's advice. Talk to your pediatrician regarding the use of this medicine in children. This medicine is not approved for use in children. Overdosage: If you think you have taken too much of this medicine contact a poison control center or emergency room at once. NOTE: This medicine is only for you. Do not share this medicine with others. What if I miss a dose? If you miss a dose, take it as soon as you can. If it is almost time for your next dose, take only that dose. Do not take double or extra doses. What may  interact with this medicine? Do not take this medicine with any of the following medications:  cisapride  cobicistat  nitrates like amyl nitrite, isosorbide dinitrate, isosorbide mononitrate, nitroglycerin  riociguat  telaprevir This medicine may also interact with the following medications:  antiviral medicines for HIV or AIDS  bosentan  certain medicines for benign prostatic hyperplasia (BPH)  certain medicines for blood pressure  certain medicines for fungal infections like ketoconazole and itraconazole  cimetidine  erythromycin  rifampin This list may not describe all possible interactions. Give your health care provider a list of all the medicines, herbs, non-prescription drugs, or dietary supplements you use. Also tell them if you smoke, drink alcohol, or use illegal drugs. Some items may interact with your medicine. What should I watch for while using this medicine? Tell your doctor or healthcare professional if your symptoms do not start to get better or if they get worse. Tell your doctor or health care professional right away if you have any change in your eyesight or hearing. You may get dizzy. Do not drive, use machinery, or do anything that needs mental alertness until you know how this medicine affects you. Do not stand or sit up quickly, especially if you are an older patient. This reduces the risk of dizzy or fainting spells. Avoid alcoholic drinks; they can make you more dizzy. What side effects may I notice from receiving this medicine? Side effects that you should report to your doctor or health care professional as soon as possible:  allergic reactions like skin rash, itching or  hives, swelling of the face, lips, or tongue  breathing problems  changes in vision  chest pain  decreased hearing  fast, irregular heartbeat  men: prolonged or painful erection (lasting more than 4 hours) Side effects that usually do not require medical attention (report  to your doctor or health care professional if they continue or are bothersome):  facial flushing  headache  nosebleed  trouble sleeping  upset stomach This list may not describe all possible side effects. Call your doctor for medical advice about side effects. You may report side effects to FDA at 1-800-FDA-1088.

## 2020-07-10 LAB — CENTROMERE ANTIBODIES: Centromere Ab Screen: >8 AI — AB

## 2020-07-10 LAB — RNA POLYMERASE III ABY: RNA Polymerase III Aby: <20 Units (ref <20)

## 2020-07-10 LAB — ANTI-SCLERODERMA ANTIBODY: Scleroderma (Scl-70) (ENA) Antibody, IgG: <1 AI

## 2020-07-11 ENCOUNTER — Other Ambulatory Visit: Payer: Self-pay | Admitting: Internal Medicine

## 2020-07-11 ENCOUNTER — Encounter: Payer: Self-pay | Admitting: Internal Medicine

## 2020-07-11 DIAGNOSIS — M0579 Rheumatoid arthritis with rheumatoid factor of multiple sites without organ or systems involvement: Secondary | ICD-10-CM

## 2020-07-12 ENCOUNTER — Telehealth: Payer: Self-pay | Admitting: Radiology

## 2020-07-12 ENCOUNTER — Other Ambulatory Visit: Payer: Self-pay | Admitting: Radiology

## 2020-07-12 DIAGNOSIS — I7301 Raynaud's syndrome with gangrene: Secondary | ICD-10-CM

## 2020-07-12 DIAGNOSIS — M341 CR(E)ST syndrome: Secondary | ICD-10-CM

## 2020-07-12 MED ORDER — SILDENAFIL CITRATE 20 MG PO TABS: 20.0000 mg | ORAL_TABLET | Freq: Two times a day (BID) | ORAL | 1 refills | Status: DC

## 2020-07-12 NOTE — Telephone Encounter (Signed)
Yes that would be fine to send to [facility] if patient is okay with it

## 2020-07-12 NOTE — Progress Notes (Signed)
Spoke with patient, advised Sildenafil was denied by insurance but medication can be purchased for a reasonable price using GoodRx. Patient is in agreement with this plan. Medication has been sent to [facility].

## 2020-07-12 NOTE — Telephone Encounter (Signed)
Sildenafil Citrate 20 mg tablet has been denied by insurance. With GoodRx coupon, medication can be filled at [facility] for $6.22. Assuming patient is agreeable, okay to send Rx to [place]?

## 2020-07-12 NOTE — Progress Notes (Signed)
Lab tests show the anti centromere antibodies which is consistent with her previous ANA test results. She did not have any of the other antibodies often associated with scleroderma in some of the other organs like kidneys.

## 2020-07-13 ENCOUNTER — Telehealth: Payer: Self-pay

## 2020-07-13 NOTE — Telephone Encounter (Signed)
Typically it is taken together but taking the dosing split up will not generally change the drug effectiveness. HCQ stays in the system for a long duration. Yes she should plan to continue the amlodipine while starting revatio these do not interact and are both helpful for her symptoms. If she felt side effects or problems taking both medicines, then she could discontinue the amlodipine in that case. In that case I will not send new prescriptions at this time or did she need something else ordered?

## 2020-07-13 NOTE — Telephone Encounter (Signed)
I called patient, patient verbalized understanding. 

## 2020-07-13 NOTE — Telephone Encounter (Signed)
Patient called requesting a return call regarding her Hydroxychloroquine prescription.  Patient states she has questions regarding the dosage and instructions.

## 2020-07-21 ENCOUNTER — Encounter: Payer: Self-pay | Admitting: Internal Medicine

## 2020-07-21 NOTE — Telephone Encounter (Signed)
I'm very sorry to hear that. I do not think there is any severe risks with going back to a low dose prednisone 5 mg daily. Does she need a new prescription for this medication? Was there any specific symptom that felt horrible so we can make a note to watch out for the future?

## 2020-07-25 ENCOUNTER — Encounter: Payer: Self-pay | Admitting: Internal Medicine

## 2020-08-08 ENCOUNTER — Encounter: Payer: Self-pay | Admitting: Family Medicine

## 2020-08-08 MED ORDER — AMPHETAMINE-DEXTROAMPHETAMINE 15 MG PO TABS: 15.0000 mg | ORAL_TABLET | Freq: Two times a day (BID) | ORAL | 0 refills | Status: DC

## 2020-08-08 NOTE — Telephone Encounter (Signed)
Adderall sent to local pharmacy.

## 2020-08-11 MED ORDER — AMPHETAMINE-DEXTROAMPHETAMINE 15 MG PO TABS: 15.0000 mg | ORAL_TABLET | Freq: Two times a day (BID) | ORAL | 0 refills | Status: DC

## 2020-08-22 ENCOUNTER — Encounter: Payer: Self-pay | Admitting: Family Medicine

## 2020-08-22 NOTE — Progress Notes (Signed)
10-4

## 2020-08-24 ENCOUNTER — Encounter: Payer: Self-pay | Admitting: *Deleted

## 2020-09-05 NOTE — Progress Notes (Signed)
Office Visit Note  Patient: Dawn Griffin             Date of Birth: [birth year]           MRN: [id number]             PCP: Hali Marry, MD Referring: Hali Marry, * Visit Date: 09/06/2020   Subjective:  Follow-up (Patient is doing well overall. )   History of Present Illness: Dawn Griffin is a 50 y.o. female here for follow up for RA/crest overlap syndrome currently on Orencia 125 mg subcu weekly, hydroxychloroquine 300 mg p.o. daily, methotrexate 15 mg p.o. weekly, prednisone 5 mg p.o. daily.  Since her last visit she developed a significant intolerance to the amlodipine and sildenafil tried for the Raynaud's symptoms and discontinued these medicine with resolution.  Overall she feels her joints are doing somewhat better with the continued Orencia treatment.  She has had annual pulmonology follow-up with stable pulmonary function testing.  The warmer weather has improved the frequency and severity of Raynaud's symptoms though these still occur when indoors.  She has still followed up with the vagal nerve stimulator RA trial but on discussion with the study rheumatologist may discontinue this.  She has some ongoing epigastric pain and nausea symptoms and has appointment for gastroenterology evaluation.   Review of Systems  Constitutional:  Negative for fatigue.  HENT:  Negative for mouth sores, mouth dryness and nose dryness.   Eyes:  Negative for pain, itching, visual disturbance and dryness.  Respiratory:  Positive for shortness of breath. Negative for cough, hemoptysis and difficulty breathing.   Cardiovascular:  Negative for chest pain, palpitations and swelling in legs/feet.  Gastrointestinal:  Negative for abdominal pain, blood in stool, constipation and diarrhea.  Endocrine: Negative for increased urination.  Genitourinary:  Negative for painful urination.  Musculoskeletal:  Positive for joint pain and joint pain. Negative for joint swelling, myalgias, muscle  weakness, morning stiffness, muscle tenderness and myalgias.  Skin:  Positive for color change. Negative for rash and redness.  Allergic/Immunologic: Negative for susceptible to infections.  Neurological:  Positive for numbness. Negative for dizziness, headaches, memory loss and weakness.  Hematological:  Negative for swollen glands.  Psychiatric/Behavioral:  Negative for confusion and sleep disturbance.    PMFS History:  Patient Active Problem List   Diagnosis Date Noted   CREST syndrome ([place]) 07/06/2020   GERD (gastroesophageal reflux disease) 07/06/2020   High risk medication use 05/25/2020   Vitamin D deficiency 05/25/2020   S/P coil embolization of cerebral aneurysm 02/28/2020   Centrilobular emphysema ([place]) 08/04/2019   Chronic fatigue 05/10/2019   Brain fog 05/10/2019   Myofascial pain 06/30/2018   Rheumatoid arthritis ([place]) 05/20/2017   Bilateral hand numbness 08/17/2013   Arthritis of left carpometacarpal joint 02/26/2013   SOB (shortness of breath) 02/26/2013   Raynaud's syndrome with gangrene ([place]) 01/28/2013   ANEMIA, HX OF 01/10/2010   URINARY URGENCY 12/21/2009   LYMPHADENOPATHY 08/15/2008    Past Medical History:  Diagnosis Date   Aneurysm ([place])    Irritable bowel syndrome with diarrhea    Raynaud disease    Rheumatoid arthritis ([place])    Sessile colonic polyp     Family History  Problem Relation Age of Onset   Bipolar disorder Mother    Lung cancer Mother    Lung cancer Father    Gout Father    Healthy Sister    Lupus Sister    Healthy Brother    Arthritis/Rheumatoid  Maternal Grandmother    Stomach cancer Paternal Grandmother    Past Surgical History:  Procedure Laterality Date   ANEURYSM COILING  02/2020   FINGER ARTHROSCOPY WITH CARPOMETACARPEL [facility]) ARTHROPLASTY Left 03/2019   LUMBAR SPINE SURGERY  2001   L4-5-S1   TONSILLECTOMY  1990   VAGUS NERVE STIMULATOR INSERTION  09/2019   Social History   Social History Narrative   No exercise. +  daily caffeine.    Immunization History  Administered Date(s) Administered   Influenza Whole 01/10/2010   Tdap 01/11/2013     Objective: Vital Signs: BP 102/67 (BP Location: Left Arm, Patient Position: Sitting, Cuff Size: Normal)   Pulse 86   Ht 5\' 4"  (1.626 m)   Wt 124 lb 3.2 oz (56.3 kg)   BMI 21.32 kg/m    Physical Exam Cardiovascular:     Rate and Rhythm: Normal rate and regular rhythm.  Pulmonary:     Effort: Pulmonary effort is normal.     Breath sounds: Normal breath sounds.  Skin:    General: Skin is warm and dry.     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     Mental Status: She is alert.     Musculoskeletal Exam:  Shoulders full ROM no tenderness or swelling Elbows full ROM no tenderness or swelling Wrists full ROM no tenderness or swelling Fingers full ROM no tenderness or swelling Knees full ROM no tenderness or swelling Ankles full ROM no tenderness or swelling MTPs full ROM no tenderness or swelling   CDAI Exam: CDAI Score: 3  Patient Global: 20 mm; Provider Global: 10 mm Swollen: 0 ; Tender: 0  Joint Exam 09/06/2020   All documented joints were normal     Investigation: No additional findings.  Imaging: No results found.  Recent Labs: Lab Results  Component Value Date   WBC 11.9 06/23/2020   HGB 14.4 06/23/2020   PLT 324 06/23/2020   NA 139 05/25/2020   K 4.2 05/25/2020   CL 104 05/25/2020   CO2 27 05/25/2020   GLUCOSE 89 05/25/2020   BUN 13 05/25/2020   CREATININE 0.92 05/25/2020   BILITOT 0.4 05/25/2020   ALKPHOS 38 03/18/2016   AST 16 05/25/2020   ALT 18 05/25/2020   PROT 6.5 05/25/2020   ALBUMIN 4.0 03/18/2016   CALCIUM 10.1 05/25/2020   GFRAA 86 05/25/2020     Speciality Comments: Patient is scheduled for PLQ eye exam: 10/06/2020  Procedures:  No procedures performed Allergies: Patient has no known allergies.   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  Orencia does seem to be providing additional clinical benefit.  Discussed plan and will continue Orencia weekly, methotrexate 15 mg weekly, hydroxychloroquine 106 mg daily and folic acid.  Recommend she try decreasing the prednisone dose by half to 2.5 mg for 2 weeks and then stop this completely.  Also checking sedimentation rate today.  High risk medication use - Plan: CBC with Differential/Platelet, COMPLETE METABOLIC PANEL WITH GFR  Methotrexate toxicity monitoring checking CBC and CMP today.  She has had negative QuantiFERON.  She has ophthalmology appointment scheduled for July and will have retinal screening at that time.  Centrilobular emphysema ([place])  Stable annual pulmonary function testing with no increased symptoms of cough dyspnea.  Gastroesophageal reflux disease, unspecified whether esophagitis present  Discussed association of gastric motility problems and esophagitis with her limited systemic sclerosis has GI follow-up.  Vitamin D deficiency - Plan: VITAMIN D 25 Hydroxy (Vit-D Deficiency, Fractures)  Previous vitamin D deficiency was recommended to increase supplementation to at least 1000 units daily since 3 months ago we will recheck value at this time.  Orders: Orders Placed This Encounter  Procedures   Sedimentation rate   CBC with Differential/Platelet   COMPLETE METABOLIC PANEL WITH GFR   VITAMIN D 25 Hydroxy (Vit-D Deficiency, Fractures)    No orders of the defined types were placed in this  encounter.    Follow-Up Instructions: Return in about 3 months (around 12/07/2020) for RA/CREST overlap f/u 44mos.   Collier Salina, MD  Note - This record has been created using [employer].  Chart creation errors have been sought, but may not always  have been located. Such creation errors do not reflect on  the standard of medical care.

## 2020-09-06 ENCOUNTER — Other Ambulatory Visit: Payer: Self-pay

## 2020-09-06 ENCOUNTER — Encounter: Payer: Self-pay | Admitting: Internal Medicine

## 2020-09-06 ENCOUNTER — Ambulatory Visit (INDEPENDENT_AMBULATORY_CARE_PROVIDER_SITE_OTHER): Payer: Managed Care, Other (non HMO) | Admitting: Internal Medicine

## 2020-09-06 VITALS — BP 102/67 | HR 86 | Ht 64.0 in | Wt 124.2 lb

## 2020-09-06 DIAGNOSIS — M341 CR(E)ST syndrome: Secondary | ICD-10-CM | POA: Diagnosis not present

## 2020-09-06 DIAGNOSIS — J432 Centrilobular emphysema: Secondary | ICD-10-CM

## 2020-09-06 DIAGNOSIS — M0579 Rheumatoid arthritis with rheumatoid factor of multiple sites without organ or systems involvement: Secondary | ICD-10-CM | POA: Diagnosis not present

## 2020-09-06 DIAGNOSIS — Z79899 Other long term (current) drug therapy: Secondary | ICD-10-CM

## 2020-09-06 DIAGNOSIS — K219 Gastro-esophageal reflux disease without esophagitis: Secondary | ICD-10-CM

## 2020-09-06 DIAGNOSIS — E559 Vitamin D deficiency, unspecified: Secondary | ICD-10-CM

## 2020-09-07 LAB — COMPLETE METABOLIC PANEL WITH GFR
AG Ratio: 2.1 (calc) (ref 1.0–2.5)
ALT: 22 U/L (ref 6–29)
AST: 20 U/L (ref 10–35)
Albumin: 4.5 g/dL (ref 3.6–5.1)
Alkaline phosphatase (APISO): 37 U/L (ref 31–125)
BUN: 13 mg/dL (ref 7–25)
CO2: 28 mmol/L (ref 20–32)
Calcium: 9.7 mg/dL (ref 8.6–10.2)
Chloride: 105 mmol/L (ref 98–110)
Creat: 0.8 mg/dL (ref 0.50–1.10)
GFR, Est African American: 102 mL/min/{1.73_m2} (ref >=60)
GFR, Est Non African American: 88 mL/min/{1.73_m2} (ref >=60)
Globulin: 2.1 g/dL (calc) (ref 1.9–3.7)
Glucose, Bld: 100 mg/dL — ABNORMAL HIGH (ref 65–99)
Potassium: 4.5 mmol/L (ref 3.5–5.3)
Sodium: 138 mmol/L (ref 135–146)
Total Bilirubin: 0.3 mg/dL (ref 0.2–1.2)
Total Protein: 6.6 g/dL (ref 6.1–8.1)

## 2020-09-07 LAB — CBC WITH DIFFERENTIAL/PLATELET
Absolute Monocytes: 329 cells/uL (ref 200–950)
Basophils Absolute: 38 cells/uL (ref 0–200)
Basophils Relative: 0.4 %
Eosinophils Absolute: 28 cells/uL (ref 15–500)
Eosinophils Relative: 0.3 %
HCT: 40.2 % (ref 35.0–45.0)
Hemoglobin: 13.3 g/dL (ref 11.7–15.5)
Lymphs Abs: 1871 cells/uL (ref 850–3900)
MCH: 32.1 pg (ref 27.0–33.0)
MCHC: 33.1 g/dL (ref 32.0–36.0)
MCV: 97.1 fL (ref 80.0–100.0)
MPV: 10 fL (ref 7.5–12.5)
Monocytes Relative: 3.5 %
Neutro Abs: 7135 cells/uL (ref 1500–7800)
Neutrophils Relative %: 75.9 %
Platelets: 276 10*3/uL (ref 140–400)
RBC: 4.14 10*6/uL (ref 3.80–5.10)
RDW: 12.5 % (ref 11.0–15.0)
Total Lymphocyte: 19.9 %
WBC: 9.4 10*3/uL (ref 3.8–10.8)

## 2020-09-07 LAB — SEDIMENTATION RATE: Sed Rate: 2 mm/h (ref 0–20)

## 2020-09-07 LAB — VITAMIN D 25 HYDROXY (VIT D DEFICIENCY, FRACTURES): Vit D, 25-Hydroxy: 76 ng/mL (ref 30–100)

## 2020-09-07 NOTE — Progress Notes (Signed)
Labs look good for continuing current medications. Vitamin D level is very improved compared to 3 months ago. If she is taking more than 1000 units (25 mcg) supplement daily it would be okay to decrease to this amount.

## 2020-09-13 ENCOUNTER — Ambulatory Visit: Payer: Managed Care, Other (non HMO) | Admitting: Internal Medicine

## 2020-09-13 ENCOUNTER — Other Ambulatory Visit (INDEPENDENT_AMBULATORY_CARE_PROVIDER_SITE_OTHER): Payer: Managed Care, Other (non HMO)

## 2020-09-13 ENCOUNTER — Encounter: Payer: Self-pay | Admitting: Internal Medicine

## 2020-09-13 VITALS — BP 88/64 | HR 68 | Ht 64.0 in | Wt 124.0 lb

## 2020-09-13 DIAGNOSIS — Z860100 Personal history of colon polyps, unspecified: Secondary | ICD-10-CM

## 2020-09-13 DIAGNOSIS — M341 CR(E)ST syndrome: Secondary | ICD-10-CM | POA: Diagnosis not present

## 2020-09-13 DIAGNOSIS — K219 Gastro-esophageal reflux disease without esophagitis: Secondary | ICD-10-CM

## 2020-09-13 DIAGNOSIS — K529 Noninfective gastroenteritis and colitis, unspecified: Secondary | ICD-10-CM

## 2020-09-13 LAB — HIGH SENSITIVITY CRP: CRP, High Sensitivity: 0.63 mg/L (ref 0.000–5.000)

## 2020-09-13 MED ORDER — SUTAB 1479-225-188 MG PO TABS: ORAL_TABLET | ORAL | 0 refills | Status: DC

## 2020-09-13 NOTE — Patient Instructions (Addendum)
Your provider has requested that you go to the basement level for lab work before leaving today. Press "B" on the elevator. The lab is located at the first door on the left as you exit the elevator.  Continue Imodium.  You have been scheduled for an endoscopy and colonoscopy. Please follow the written instructions given to you at your visit today. Please pick up your prep supplies at the pharmacy within the next 1-3 days. If you use inhalers (even only as needed), please bring them with you on the day of your procedure.  If you are age 75 or younger, your body mass index should be between 19-25. Your Body mass index is 21.28 kg/m. If this is out of the aformentioned range listed, please consider follow up with your Primary Care Provider.   __________________________________________________________  The [place] [facility] providers would like to encourage you to use [facility] to communicate with providers for non-urgent requests or questions.  Due to long hold times on the telephone, sending your provider a message by [facility] may be a faster and more efficient way to get a response.  Please allow 48 business hours for a response.  Please remember that this is for non-urgent requests.   Due to recent changes in healthcare laws, you may see the results of your imaging and laboratory studies on MyChart before your provider has had a chance to review them.  We understand that in some cases there may be results that are confusing or concerning to you. Not all laboratory results come back in the same time frame and the provider may be waiting for multiple results in order to interpret others.  Please give [place] 48 hours in order for your provider to thoroughly review all the results before contacting the office for clarification of your results.

## 2020-09-13 NOTE — Progress Notes (Signed)
Patient ID: Dawn Griffin, female   DOB: 04-Dec-1970, 50 y.o.   MRN: [id number] HPI: Dawn Griffin is a 50 year old female with a history of sessile serrated colon polyp, chronic diarrhea, GERD symptoms, rheumatoid arthritis,CREST who is seen in consultation at the request of Dawn Griffin to evaluate GERD symptoms but also chronic diarrhea.  She is here alone today.  She has prior GI history with Dawn Griffin with [facility].  She underwent a colonoscopy on 03/08/2015.  This revealed an 8 mm polyp removed from the proximal ascending colon which was found to be a sessile serrated polyp.  Right colon biopsies showed reactive changes which was felt nonspecific.  There was no changes of microscopic colitis or active inflammation.  She reports that she was diagnosed with rheumatoid arthritis in 2018.  She has been treated with Enbrel which was not very helpful.  She is also been on methotrexate since diagnosis.  Later hydroxychloroquine was added in 6 months ago Dawn Griffin was added.  Orencia seems to help her symptoms more than any previous therapy.  She also intermittently takes prednisone and she has tapered down to 5 mg which she is currently taking with the plans for tapering off over the next month.  She has frequent fatigue for which Adderall was started initially twice daily though since Orencia she has needed Adderall not necessarily on a daily basis.  Her heartburn symptoms seem to worsen with Adderall.  She reports that she has had chronic heartburn dating back to when she was in high school.  She reports at one point she was told she had "ulcers in her esophagus".  Heartburn is a daily issue for her.  She does not have dysphagia.  She does report throat clearing at times with liquids and at times she feels like she gets "choked on liquids".  Solid food seems to go down fine.  She finds the symptoms annoying.  She is tried multiple over-the-counter medications including omeprazole 20 mg.   This did not help as much as what she is currently taking which is cimetidine 200 mg once to twice daily.  She has chronic diarrhea for which she takes Imodium 2 mg every morning.  Without Imodium she can have 15-20 bowel movements throughout the day.  These are described as watery.  It can be associated with lower abdominal crampy pain.  At times it will wake her up at night with watery diarrhea.  She does not see oily stools or blood in her stool.  Stools are often urgent.  With the Imodium she will have 4-5 stools per day but they are often formed.  Past Medical History:  Diagnosis Date   Aneurysm ([place]) 02/2020   CREST (calcinosis, Raynaud's phenomenon, esophageal dysfunction, sclerodactyly, telangiectasia) ([place]) 2022   Irritable bowel syndrome with diarrhea    Raynaud disease    Rheumatoid arthritis ([place])    Sessile colonic polyp     Past Surgical History:  Procedure Laterality Date   ANEURYSM COILING  02/2020   FINGER ARTHROSCOPY WITH CARPOMETACARPEL ([facility]) ARTHROPLASTY Left 03/2019   LUMBAR SPINE SURGERY  2001   L4-5-S1   TONSILLECTOMY  1990   VAGUS NERVE STIMULATOR INSERTION  09/2019    Outpatient Medications Prior to Visit  Medication Sig Dispense Refill   amphetamine-dextroamphetamine (ADDERALL) 15 MG tablet Take 1 tablet by mouth 2 (two) times daily. 180 tablet 0   b complex vitamins tablet Take by mouth.     Cats Claw, Uncaria tomentosa, (CATS CLAW PO)  Take by mouth. Hypocotyis extract 1500 mg and cats claw 300 mg     Cholecalciferol (VITAMIN D) 125 MCG (5000 UT) CAPS Take 5,000 Units by mouth daily.     cimetidine (TAGAMET) 200 MG tablet Take by mouth daily.     folic acid (FOLVITE) 1 MG tablet      hydroxychloroquine (PLAQUENIL) 200 MG tablet Take 300 mg by mouth daily.     loperamide (IMODIUM) 2 MG capsule Take by mouth daily.     methotrexate (RHEUMATREX) 2.5 MG tablet Take 15 mg by mouth once a week.     ORENCIA CLICKJECT 161 MG/ML SOAJ INJECT 125 MG UNDER THE SKIN  ONCE A WEEK. START 06/06/2020 4 mL 5   predniSONE (DELTASONE) 10 MG tablet Take 1 tablet (10 mg total) by mouth daily with breakfast. (Patient taking differently: Take 5 mg by mouth daily with breakfast.) 30 tablet 1   amLODipine (NORVASC) 5 MG tablet Take 1 tablet (5 mg total) by mouth daily. (Patient not taking: Reported on 09/06/2020) 30 tablet 1   sildenafil (REVATIO) 20 MG tablet Take 1 tablet (20 mg total) by mouth in the morning and at bedtime. (Patient not taking: Reported on 09/06/2020) 60 tablet 1   No facility-administered medications prior to visit.    No Known Allergies  Family History  Problem Relation Age of Onset   Bipolar disorder Mother    Lung cancer Mother    Lung cancer Father    Gout Father    Healthy Sister    Lupus Sister    Healthy Brother    Arthritis/Rheumatoid Maternal Grandmother    Stomach cancer Paternal Grandmother    Colon cancer Neg Hx    Esophageal cancer Neg Hx     Social History   Tobacco Use   Smoking status: Every Day    Packs/day: 0.30    Pack years: 0.00    Types: Cigarettes   Smokeless tobacco: Never  Vaping Use   Vaping Use: Never used  Substance Use Topics   Alcohol use: Yes    Alcohol/week: 1.0 standard drink    Types: 1 Standard drinks or equivalent per week    Comment: 6 per week   Drug use: No    ROS: As per history of present illness, otherwise negative  BP (!) 88/64   Pulse 68   Ht 5\' 4"  (1.626 m)   Wt 124 lb (56.2 kg)   BMI 21.28 kg/m  Constitutional: Well-developed and well-nourished. No distress. HEENT: Normocephalic and atraumatic.   No scleral icterus. Cardiovascular: Normal rate, regular rhythm and intact distal pulses. No M/R/G Pulmonary/chest: Effort normal and breath sounds normal. No wheezing, rales or rhonchi. Abdominal: Soft, nontender, nondistended. Bowel sounds active throughout. There are no masses palpable. No hepatosplenomegaly. Extremities: no clubbing, cyanosis, or edema Neurological: Alert  and oriented to person place and time. Skin: Skin is warm and dry.  Psychiatric: Normal mood and affect. Behavior is normal.  RELEVANT LABS AND IMAGING: CBC    Component Value Date/Time   WBC 9.4 09/06/2020 1405   RBC 4.14 09/06/2020 1405   HGB 13.3 09/06/2020 1405   HCT 40.2 09/06/2020 1405   PLT 276 09/06/2020 1405   MCV 97.1 09/06/2020 1405   MCH 32.1 09/06/2020 1405   MCHC 33.1 09/06/2020 1405   RDW 12.5 09/06/2020 1405   LYMPHSABS 1,871 09/06/2020 1405   MONOABS 518 03/18/2016 0803   EOSABS 28 09/06/2020 1405   BASOSABS 38 09/06/2020 1405    CMP  Component Value Date/Time   NA 138 09/06/2020 1405   K 4.5 09/06/2020 1405   CL 105 09/06/2020 1405   CO2 28 09/06/2020 1405   GLUCOSE 100 (H) 09/06/2020 1405   BUN 13 09/06/2020 1405   CREATININE 0.80 09/06/2020 1405   CALCIUM 9.7 09/06/2020 1405   PROT 6.6 09/06/2020 1405   ALBUMIN 4.0 03/18/2016 0803   AST 20 09/06/2020 1405   ALT 22 09/06/2020 1405   ALKPHOS 38 03/18/2016 0803   BILITOT 0.3 09/06/2020 1405   GFRNONAA 88 09/06/2020 1405   GFRAA 102 09/06/2020 1405    ASSESSMENT/PLAN: 50 year old female with a history of sessile serrated colon polyp, chronic diarrhea, GERD symptoms, rheumatoid arthritis,CREST who is seen in consultation at the request of Dawn Griffin to evaluate GERD symptoms but also chronic diarrhea.   GERD --she is having heartburn and GERD symptoms and has tried over-the-counter strength PPI and has most recently started cimetidine which she is found to be quite helpful.  We discussed her autoimmune conditions including CREST which can lead to an esophageal dysmotility picture.  Currently her symptoms seem more acid related rather than dysphagia or dysmotility related.  I ran an Statistician and there is concern with cimetidine and Plaquenil as cimetidine can lead to increased Plaquenil serum drug level.  I recommended the following: --EGD; we reviewed the risk, benefits and alternatives  and she is agreeable and wishes to proceed --In the interim I am going to have her stop cimetidine and replace with famotidine 20 mg twice daily; consider prescription strength PPI depending on EGD findings and response to famotidine --Aware of her autoimmune conditions and in the future if dysphagia becomes predominant we can evaluate esophageal motility formally  2.  Chronic diarrhea --when she was initially evaluated for chronic diarrhea in 2016 she did not have her diagnosis of rheumatoid arthritis for CREST.  At this point I feel it is pertinent to exclude inflammatory bowel disease but also in the differential his pancreatic exocrine insufficiency I would like to exclude celiac disease. --Fecal elastase, fecal calprotectin, CRP and celiac panel --Colonoscopy in the [place] plan random biopsies  3.  History of sessile serrated polyp --removed in 2016 as above; due for surveillance colonoscopy at this time    LO:VFIEPPIR, Rene Kocher, [address] [place],  [place] [id number]

## 2020-09-14 LAB — TISSUE TRANSGLUTAMINASE, IGA: (tTG) Ab, IgA: <1 U/mL

## 2020-09-14 LAB — IGA: Immunoglobulin A: 114 mg/dL (ref 47–310)

## 2020-09-28 ENCOUNTER — Encounter: Payer: Self-pay | Admitting: Internal Medicine

## 2020-09-29 ENCOUNTER — Other Ambulatory Visit: Payer: Self-pay | Admitting: Radiology

## 2020-09-29 DIAGNOSIS — M349 Systemic sclerosis, unspecified: Secondary | ICD-10-CM

## 2020-09-29 DIAGNOSIS — M341 CR(E)ST syndrome: Secondary | ICD-10-CM

## 2020-09-29 DIAGNOSIS — M0579 Rheumatoid arthritis with rheumatoid factor of multiple sites without organ or systems involvement: Secondary | ICD-10-CM

## 2020-09-29 NOTE — Telephone Encounter (Signed)
I believe the best local dermatologist for her would be Dr. Sharol Roussel at [facility] since he specializes in systemic autoimmune conditions such as scleroderma. I don't know anything about his current availability, we can refer if she is okay going to see them.

## 2020-10-04 ENCOUNTER — Other Ambulatory Visit: Payer: Managed Care, Other (non HMO)

## 2020-10-04 DIAGNOSIS — K219 Gastro-esophageal reflux disease without esophagitis: Secondary | ICD-10-CM

## 2020-10-04 DIAGNOSIS — K529 Noninfective gastroenteritis and colitis, unspecified: Secondary | ICD-10-CM

## 2020-10-04 DIAGNOSIS — Z860100 Personal history of colon polyps, unspecified: Secondary | ICD-10-CM

## 2020-10-10 DIAGNOSIS — K8689 Other specified diseases of pancreas: Secondary | ICD-10-CM | POA: Insufficient documentation

## 2020-10-10 LAB — OVA AND PARASITE EXAMINATION
CONCENTRATE RESULT:: NONE SEEN
MICRO NUMBER:: 12169315
SPECIMEN QUALITY:: ADEQUATE
TRICHROME RESULT:: NONE SEEN

## 2020-10-10 LAB — EXTRA SPECIMEN

## 2020-10-10 LAB — PANCREATIC ELASTASE, FECAL: Pancreatic Elastase-1, Stool: 46 mcg/g — ABNORMAL LOW

## 2020-10-10 LAB — CALPROTECTIN: Calprotectin: <5 mcg/g

## 2020-10-12 ENCOUNTER — Other Ambulatory Visit: Payer: Self-pay

## 2020-10-12 DIAGNOSIS — K8689 Other specified diseases of pancreas: Secondary | ICD-10-CM

## 2020-10-12 MED ORDER — PANCRELIPASE (LIP-PROT-AMYL) 36000-114000 UNITS PO CPEP: 36000.0000 [IU] | ORAL_CAPSULE | Freq: Three times a day (TID) | ORAL | 3 refills | Status: DC

## 2020-10-13 ENCOUNTER — Telehealth: Payer: Self-pay

## 2020-10-13 NOTE — Telephone Encounter (Signed)
Submitted PA for Creon through Cover My Meds.  Awaiting response.

## 2020-10-16 ENCOUNTER — Telehealth: Payer: Self-pay | Admitting: Internal Medicine

## 2020-10-16 NOTE — Telephone Encounter (Signed)
Pt is scheduled for a CT scan on Wednesday, she had a Kenalog shot on 8/2 and wants to know if it is ok to proceed with CT scan as she was informed tha steroid shot could interfere with CT scan. Pls call her.

## 2020-10-16 NOTE — Telephone Encounter (Signed)
Let pt know that CT states it will be fine for her to proceed with the scan. Pt aware.

## 2020-10-18 ENCOUNTER — Inpatient Hospital Stay: Admission: RE | Admit: 2020-10-18 | Payer: Managed Care, Other (non HMO) | Source: Ambulatory Visit

## 2020-10-19 MED ORDER — PANCREAZE 37000-97300 UNITS PO CPEP: 2.0000 | ORAL_CAPSULE | Freq: Three times a day (TID) | ORAL | 2 refills | Status: DC

## 2020-10-19 NOTE — Telephone Encounter (Signed)
I have spoken with Dr Hilarie Fredrickson as it has been discovered that patient insurance should cover [place]. Per Dr Hilarie Fredrickson, patient to have Pancreaz 37000 unit-2 capsules with each meal and 1 capsule with each snack.   Rx has been sent to pharmacy for this.

## 2020-10-19 NOTE — Telephone Encounter (Signed)
Dr Hilarie Fredrickson- With the denial of patient's creon by insurance, would you be okay trying to send Zenpep instead? If Zenpep is not approved, I can try to appeal for the Creon.   Unfortunately, the insurance denial gives no indication of an alternative that would be approved.

## 2020-10-19 NOTE — Telephone Encounter (Signed)
PA for Creon denied

## 2020-10-19 NOTE — Telephone Encounter (Signed)
I have spoken to Simona Huh, nurse reviewer who indicates that case is still under review. I have advised that patient has chronic diarrhea and pancreatic insufficiency. He states he will add this to case notes.   Of note: pelvis may not be approved.

## 2020-10-19 NOTE — Telephone Encounter (Signed)
Dr Hilarie Fredrickson- Can you do a peer to peer for this patient CT abd/pelvis with pancreatic protocol?  If so, I can schedule a time/date.... Any preferences?

## 2020-10-19 NOTE — Telephone Encounter (Signed)
If you have any questions please contact [employer] at [phone]. Case/AuthorizationService [id number] Initiated Date:08/09/2022Case Activity:Physician Review ProcessCase Status:Pending

## 2020-10-19 NOTE — Telephone Encounter (Signed)
[address] 1 CT ABDOMEN and PELVIS; with contrast Denied Based on [employer] Abdomen Imaging Guidelines Section(s): AB 21.2 Chronic Diarrhea (more than 30 days), we cannot approve this request. Your healthcare provider told us that you have liquid stool that is (diarrhea) chronic (more than 30 days). The request cannot be approved because:  Results of a colonoscopy (a test that uses a flexible tube with a tiny camera on the end to see live pictures from inside your body) must meet one of the following. -It was nondiagnostic. -It was suggestive of IBD (Inflammatory bowel disease). You must have had a fecal calprotectin or fecal lactoferrin. These are stool tests that detect inflammation in the bowels. You must have had stool testing (giardia antigen or PCR for giardia) to detect a parasite in the bowel. You must have had a blood test (serum IgA tissue transglutaminase or tTG) to see if your symptoms are caused by a disease makes you not able to eat foods with gluten (Celiac disease). Gluten is found in certain cereal grains such as wheat.

## 2020-10-19 NOTE — Telephone Encounter (Signed)
Per Morton Amy, benefits coordinator, another CT review is in process at this time.

## 2020-10-19 NOTE — Telephone Encounter (Signed)
Ok to substitute Zenpep at comparable dose and sig

## 2020-10-20 ENCOUNTER — Encounter: Payer: Self-pay | Admitting: Internal Medicine

## 2020-10-24 ENCOUNTER — Telehealth: Payer: Self-pay | Admitting: Radiology

## 2020-10-24 ENCOUNTER — Ambulatory Visit (INDEPENDENT_AMBULATORY_CARE_PROVIDER_SITE_OTHER)
Admission: RE | Admit: 2020-10-24 | Discharge: 2020-10-24 | Disposition: A | Discharge status: Home / Self Care | Payer: Managed Care, Other (non HMO) | Source: Ambulatory Visit | Attending: Internal Medicine | Admitting: Internal Medicine

## 2020-10-24 ENCOUNTER — Other Ambulatory Visit: Payer: Self-pay

## 2020-10-24 DIAGNOSIS — K8689 Other specified diseases of pancreas: Secondary | ICD-10-CM

## 2020-10-24 MED ORDER — IOHEXOL 300 MG/ML  SOLN
80.0000 mL | Freq: Once | INTRAMUSCULAR | Status: AC | PRN
  Administered 2020-10-24: 80 mL via INTRAVENOUS

## 2020-10-24 NOTE — Telephone Encounter (Signed)
Attempted to contact patient, LVM advising patient to call the office to schedule follow-up visit end of September per Dr. Benjamine Mola.

## 2020-11-02 ENCOUNTER — Encounter: Payer: Self-pay | Admitting: Internal Medicine

## 2020-11-06 ENCOUNTER — Encounter: Payer: Self-pay | Admitting: Internal Medicine

## 2020-11-08 ENCOUNTER — Encounter: Payer: Self-pay | Admitting: Internal Medicine

## 2020-11-08 ENCOUNTER — Ambulatory Visit (AMBULATORY_SURGERY_CENTER): Payer: Managed Care, Other (non HMO) | Admitting: Internal Medicine

## 2020-11-08 ENCOUNTER — Other Ambulatory Visit: Payer: Self-pay

## 2020-11-08 VITALS — BP 106/53 | HR 74 | Temp 99.1°F | Resp 15 | Ht 64.0 in | Wt 124.0 lb

## 2020-11-08 DIAGNOSIS — R12 Heartburn: Secondary | ICD-10-CM

## 2020-11-08 DIAGNOSIS — K2970 Gastritis, unspecified, without bleeding: Secondary | ICD-10-CM

## 2020-11-08 DIAGNOSIS — K529 Noninfective gastroenteritis and colitis, unspecified: Secondary | ICD-10-CM | POA: Diagnosis present

## 2020-11-08 DIAGNOSIS — K635 Polyp of colon: Secondary | ICD-10-CM

## 2020-11-08 DIAGNOSIS — D122 Benign neoplasm of ascending colon: Secondary | ICD-10-CM

## 2020-11-08 DIAGNOSIS — K319 Disease of stomach and duodenum, unspecified: Secondary | ICD-10-CM | POA: Diagnosis not present

## 2020-11-08 DIAGNOSIS — K229 Disease of esophagus, unspecified: Secondary | ICD-10-CM

## 2020-11-08 DIAGNOSIS — K219 Gastro-esophageal reflux disease without esophagitis: Secondary | ICD-10-CM

## 2020-11-08 DIAGNOSIS — Z86010 Personal history of colonic polyps: Secondary | ICD-10-CM

## 2020-11-08 DIAGNOSIS — D125 Benign neoplasm of sigmoid colon: Secondary | ICD-10-CM

## 2020-11-08 MED ORDER — SODIUM CHLORIDE 0.9 % IV SOLN: 500.0000 mL | Freq: Once | INTRAVENOUS | Status: DC

## 2020-11-08 NOTE — Progress Notes (Signed)
GASTROENTEROLOGY PROCEDURE H&P NOTE   Primary Care Physician: Hali Marry, MD    Reason for Procedure:  EGD and colonoscopy  Plan:    GERD, chronic diarrhea and history of colon polyp  Patient is appropriate for endoscopic procedure(s) in the ambulatory ([place]) setting.  The nature of the procedure, as well as the risks, benefits, and alternatives were carefully and thoroughly reviewed with the patient. Ample time for discussion and questions allowed. The patient understood, was satisfied, and agreed to proceed.     HPI: Dawn Griffin is a 50 y.o. female who presents for upper endoscopy and colonoscopy.  No significant changes in medical history since being seen here recently.  She did have a recent CT scan abdomen pelvis.  No recent chest pain or dyspnea.  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 Authorizing Provider  amphetamine-dextroamphetamine (ADDERALL) 15 MG tablet Take 1 tablet by mouth 2 (two) times daily. 08/11/20   Hali Marry, MD  b complex vitamins tablet Take by mouth.    [provider]  buPROPion (WELLBUTRIN SR) 150 MG 12 hr tablet Take 150 mg by mouth 2 (two) times daily. 11/07/20   [provider]  Cats Claw, Uncaria tomentosa, (CATS CLAW PO) Take by mouth. Hypocotyis  extract 1500 mg and cats claw 300 mg    [provider]  Cholecalciferol (VITAMIN D) 125 MCG (5000 UT) CAPS Take 5,000 Units by mouth daily. 05/28/20   [provider]  cimetidine (TAGAMET) 200 MG tablet Take by mouth daily.    [provider]  clobetasol cream (TEMOVATE) 0.05 % Apply topically. 10/19/20   [provider]  folic acid (FOLVITE) 1 MG tablet  04/22/17   [provider]  hydroxychloroquine (PLAQUENIL) 200 MG tablet Take 300 mg by mouth daily.    [provider]  loperamide (IMODIUM) 2 MG capsule Take by mouth daily.    [provider]  methotrexate (RHEUMATREX) 2.5 MG tablet Take 15 mg by mouth once a week. 08/07/20   [provider]  ORENCIA CLICKJECT 0000000 MG/ML SOAJ INJECT 125 MG UNDER THE SKIN ONCE A WEEK. START 06/06/2020 07/11/20   Collier Salina, MD  Pancrelipase, Lip-Prot-Amyl, (PANCREAZE) 8154246744 units CPEP Take 2 capsules by mouth 3 (three) times daily with meals. Take 1 capsule with each snack. Pharmacy-please d/c creon script-insurance will not cover 10/19/20   , Lajuan Lines, MD  predniSONE (DELTASONE) 10 MG tablet Take 1 tablet (10 mg total) by mouth daily with breakfast. Patient not taking: Reported on 11/08/2020 05/25/20   Collier Salina, MD  valACYclovir (VALTREX) 500 MG tablet Take 500 mg by mouth daily. 10/04/20   [provider]    Current Outpatient Medications  Medication Sig Dispense Refill   amphetamine-dextroamphetamine (ADDERALL) 15 MG tablet Take  1 tablet by mouth 2 (two) times daily. 180 tablet 0   b complex vitamins tablet Take by mouth.     buPROPion (WELLBUTRIN SR) 150 MG 12 hr tablet Take 150 mg by mouth 2 (two) times daily.     Cats Claw, Uncaria tomentosa, (CATS CLAW PO) Take by mouth. Hypocotyis extract 1500 mg and cats claw 300 mg     Cholecalciferol (VITAMIN D) 125 MCG (5000 UT) CAPS Take 5,000 Units by mouth daily.     cimetidine (TAGAMET) 200 MG tablet Take by  mouth daily.     clobetasol cream (TEMOVATE) 0.05 % Apply topically.     folic acid (FOLVITE) 1 MG tablet      hydroxychloroquine (PLAQUENIL) 200 MG tablet Take 300 mg by mouth daily.     loperamide (IMODIUM) 2 MG capsule Take by mouth daily.     methotrexate (RHEUMATREX) 2.5 MG tablet Take 15 mg by mouth once a week.     ORENCIA CLICKJECT 0000000 MG/ML SOAJ INJECT 125 MG UNDER THE SKIN ONCE A WEEK. START 06/06/2020 4 mL 5   Pancrelipase, Lip-Prot-Amyl, (PANCREAZE) 37000-97300 units CPEP Take 2 capsules by mouth 3 (three) times daily with meals. Take 1 capsule with each snack. Pharmacy-please d/c creon script-insurance will not cover 240 capsule 2   predniSONE (DELTASONE) 10 MG tablet Take 1 tablet (10 mg total) by mouth daily with breakfast. (Patient not taking: Reported on 11/08/2020) 30 tablet 1   valACYclovir (VALTREX) 500 MG tablet Take 500 mg by mouth daily.     Current Facility-Administered Medications  Medication Dose Route Frequency Provider Last Rate Last Admin   0.9 %  sodium chloride infusion  500 mL Intravenous Once , Lajuan Lines, MD        Allergies as of 11/08/2020   (No Known Allergies)    Family History  Problem Relation Age of Onset   Bipolar disorder Mother    Lung cancer Mother    Lung cancer Father    Gout Father    Healthy Sister    Lupus Sister    Healthy Brother    Arthritis/Rheumatoid Maternal Grandmother    Stomach cancer Paternal Grandmother    Colon cancer Neg Hx    Esophageal cancer Neg Hx    Colon polyps Neg Hx    Rectal cancer Neg Hx     Social History   Socioeconomic History   Marital status: Married    Spouse name: Not on file   Number of children: Not on file   Years of education: Not on file   Highest education level: Not on file  Occupational History   Not on file  Tobacco Use   Smoking status: Former    Packs/day: 0.30    Types: Cigarettes    Quit date: 10/30/2020    Years since quitting: 0.0   Smokeless tobacco: Never  Vaping Use    Vaping Use: Never used  Substance and Sexual Activity   Alcohol use: Not Currently    Alcohol/week: 1.0 standard drink    Types: 1 Standard drinks or equivalent per week    Comment: 6 per week,   Drug use: No   Sexual activity: Yes    Partners: Male  Other Topics Concern   Not on file  Social History Narrative   No exercise. + daily caffeine.    Social Determinants of Health   Financial Resource Strain: Not on file  Food Insecurity: Not on file  Transportation Needs: Not on file  Physical Activity: Not on file  Stress: Not on file  Social Connections: Not on file  Intimate Partner Violence: Not on file    Physical Exam: Vital signs in last 24 hours: '@BP'$  (!) 94/58 (Patient Position: Sitting)   Pulse 94   Temp 99.1 F (37.3 C)   Ht '5\' 4"'$  (1.626 m)   Wt 124 lb (56.2 kg)   SpO2 98%   BMI 21.28 kg/m  GEN: NAD EYE: Sclerae anicteric ENT: MMM CV: Non-tachycardic Pulm: CTA b/l GI: Soft, NT/ND NEURO:  Alert & Oriented x 3   Zenovia Jarred, MD [facility]  11/08/2020 2:55 PM

## 2020-11-08 NOTE — Patient Instructions (Signed)
Endoscopy-  Handout on Gastritis and GERD given to you today  Await stomach and esophageal biopsy results     Colonoscopy-  Handout on polyps & diverticulosis given to you today  Await pathology results on polyps removed and on colon biopsies done    Trial of Creon ( to replace Pancrease) 36,000 units - 2 capsules with meals and 1 with snacks - samples given to you by Dr Hilarie Fredrickson - let Dr. Hilarie Fredrickson know how works    Follow up with office appointment with Dr Hilarie Fredrickson in 3 months- call and make this appointment ( office may call you)    YOU HAD AN ENDOSCOPIC PROCEDURE TODAY AT [facility]:   Refer to the procedure report that was given to you for any specific questions about what was found during the examination.  If the procedure report does not answer your questions, please call your gastroenterologist to clarify.  If you requested that your care partner not be given the details of your procedure findings, then the procedure report has been included in a sealed envelope for you to review at your convenience later.  YOU SHOULD EXPECT: Some feelings of bloating in the abdomen. Passage of more gas than usual.  Walking can help get rid of the air that was put into your GI tract during the procedure and reduce the bloating. If you had a lower endoscopy (such as a colonoscopy or flexible sigmoidoscopy) you may notice spotting of blood in your stool or on the toilet paper. If you underwent a bowel prep for your procedure, you may not have a normal bowel movement for a few days.  Please Note:  You might notice some irritation and congestion in your nose or some drainage.  This is from the oxygen used during your procedure.  There is no need for concern and it should clear up in a day or so.  SYMPTOMS TO REPORT IMMEDIATELY:  Following lower endoscopy (colonoscopy or flexible sigmoidoscopy):  Excessive amounts of blood in the stool  Significant tenderness or worsening of  abdominal pains  Swelling of the abdomen that is new, acute  Fever of 100F or higher  Following upper endoscopy (EGD)  Vomiting of blood or coffee ground material  New chest pain or pain under the shoulder blades  Painful or persistently difficult swallowing  New shortness of breath  Fever of 100F or higher  Black, tarry-looking stools  For urgent or emergent issues, a gastroenterologist can be reached at any hour by calling [phone]. Do not use MyChart messaging for urgent concerns.    DIET:  We do recommend a small meal at first, but then you may proceed to your regular diet.  Drink plenty of fluids but you should avoid alcoholic beverages for 24 hours.  ACTIVITY:  You should plan to take it easy for the rest of today and you should NOT DRIVE or use heavy machinery until tomorrow (because of the sedation medicines used during the test).    FOLLOW UP: Our staff will call the number listed on your records 48-72 hours following your procedure to check on you and address any questions or concerns that you may have regarding the information given to you following your procedure. If we do not reach you, we will leave a message.  We will attempt to reach you two times.  During this call, we will ask if you have developed any symptoms of COVID 19. If you develop any symptoms (ie: fever, flu-like  symptoms, shortness of breath, cough etc.) before then, please call [phone].  If you test positive for Covid 19 in the 2 weeks post procedure, please call and report this information to [place].    If any biopsies were taken you will be contacted by phone or by letter within the next 1-3 weeks.  Please call us at [phone] if you have not heard about the biopsies in 3 weeks.    SIGNATURES/CONFIDENTIALITY: You and/or your care partner have signed paperwork which will be entered into your electronic medical record.  These signatures attest to the fact that that the information above on your  After Visit Summary has been reviewed and is understood.  Full responsibility of the confidentiality of this discharge information lies with you and/or your care-partner.

## 2020-11-08 NOTE — Op Note (Signed)
[facility] Patient Name: Dawn Griffin Procedure Date: 11/08/2020 2:56 PM MRN: [id number] Endoscopist: Jerene Bears , MD Age: 50 Referring MD:  Date of Birth: 06-11-1970 Gender: Female Account #: [id number] Procedure:                Upper GI endoscopy Indications:              Heartburn, Suspected gastro-esophageal reflux                            disease Medicines:                Monitored Anesthesia Care Procedure:                Pre-Anesthesia Assessment:                           - Prior to the procedure, a History and Physical                            was performed, and patient medications and                            allergies were reviewed. The patient's tolerance of                            previous anesthesia was also reviewed. The risks                            and benefits of the procedure and the sedation                            options and risks were discussed with the patient.                            All questions were answered, and informed consent                            was obtained. Prior Anticoagulants: The patient has                            taken no previous anticoagulant or antiplatelet                            agents. ASA Grade Assessment: II - A patient with                            mild systemic disease. After reviewing the risks                            and benefits, the patient was deemed in                            satisfactory condition to undergo the procedure.  After obtaining informed consent, the endoscope was                            passed under direct vision. Throughout the                            procedure, the patient's blood pressure, pulse, and                            oxygen saturations were monitored continuously. The                            Endoscope was introduced through the mouth, and                            advanced to the second part of duodenum. The upper                             GI endoscopy was accomplished without difficulty.                            The patient tolerated the procedure well. Scope In: Scope Out: Findings:                 The Z-line was irregular and was found 39 cm from                            the incisors. This was biopsied with a cold forceps                            for evaluation to rule out Barrett's Esophagus.                           Moderate inflammation characterized by adherent                            blood, congestion (edema), erosions, erythema and                            granularity was found in the gastric body and in                            the gastric antrum. Biopsies were taken with a cold                            forceps for histology and Helicobacter pylori                            testing.                           The examined duodenum was normal. Biopsies for  histology were taken with a cold forceps for                            evaluation of celiac disease. Complications:            No immediate complications. Estimated Blood Loss:     Estimated blood loss was minimal. Impression:               - Z-line irregular, 39 cm from the incisors.                            Biopsied.                           - Acute gastritis. Biopsied.                           - Normal examined duodenum. Biopsied. Recommendation:           - Patient has a contact number available for                            emergencies. The signs and symptoms of potential                            delayed complications were discussed with the                            patient. Return to normal activities tomorrow.                            Written discharge instructions were provided to the                            patient.                           - Resume previous diet.                           - Continue present medications.                           - Await pathology  results.                           - See the other procedure note for documentation of                            additional recommendations. Jerene Bears, MD 11/08/2020 3:39:42 PM This report has been signed electronically.

## 2020-11-08 NOTE — Progress Notes (Signed)
Called to room to assist during endoscopic procedure.  Patient ID and intended procedure confirmed with present staff. Received instructions for my participation in the procedure from the performing physician.  

## 2020-11-08 NOTE — Op Note (Signed)
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 The patient's tolerance of                            previous anesthesia was also reviewed. The risks                            and benefits of the procedure and the sedation                            options and risks were discussed with the patient.                            All questions were answered, and informed consent                            was obtained. Prior Anticoagulants: The patient has                            taken no previous anticoagulant or antiplatelet                            agents. ASA Grade Assessment: II - A patient with                            mild systemic disease. After reviewing the risks  and benefits, the patient was deemed in                            satisfactory condition to undergo the procedure.                           After obtaining informed consent, the colonoscope                            was passed under direct vision. Throughout the                            procedure, the patient's blood pressure, pulse, and                            oxygen saturations were monitored  continuously. The                            PCF-HQ190L Colonoscope was introduced through the                            anus and advanced to the terminal ileum. The                            colonoscopy was performed without difficulty. The                            patient tolerated the procedure well. The quality                            of the bowel preparation was good. The terminal                            ileum, ileocecal valve, appendiceal orifice, and                            rectum were photographed. Scope In: 3:15:41 PM Scope Out: 3:35:26 PM Scope Withdrawal Time: 0 hours 15 minutes 36 seconds  Total Procedure Duration: 0 hours 19 minutes 45 seconds  Findings:                 The digital rectal exam was normal.                           The terminal ileum appeared normal.                           A 5 mm polyp was found in the ascending colon. The                            polyp was sessile. The polyp was removed with a                            cold snare. Resection and retrieval were complete.  A 15 mm polyp was found in the ascending colon. The                            polyp was sessile. The polyp was removed with a                            cold snare. Resection and retrieval were complete.                           A 6 mm polyp was found in the sigmoid colon. The                            polyp was sessile. The polyp was removed with a                            cold snare. Resection and retrieval were complete.                           Multiple small-mouthed diverticula were found in                            the sigmoid colon.                           Biopsies for histology were taken with a cold                            forceps from the right colon and left colon for                            evaluation of microscopic colitis.                           The retroflexed view of the distal rectum and anal                             verge was normal and showed no anal or rectal                            abnormalities. Complications:            No immediate complications. Estimated Blood Loss:     Estimated blood loss was minimal. Impression:               - The examined portion of the ileum was normal.                           - One 5 mm polyp in the ascending colon, removed                            with a cold snare. Resected and retrieved.                           - One  15 mm polyp in the ascending colon, removed                            with a cold snare. Resected and retrieved.                           - One 6 mm polyp in the sigmoid colon, removed with                            a cold snare. Resected and retrieved.                           - Diverticulosis in the sigmoid colon.                           - The distal rectum and anal verge are normal on                            retroflexion view.                           - Otherwise normal colonic mucosa with no evidence                            of colitis. Biopsies were taken with a cold forceps                            from the right colon and left colon for evaluation                            of microscopic colitis. Recommendation:           - Patient has a contact number available for                            emergencies. The signs and symptoms of potential                            delayed complications were discussed with the                            patient. Return to normal activities tomorrow.                            Written discharge instructions were provided to the                            patient.                           - Resume previous diet.                           - Continue present medications. Trial of Creon (to  replace Pancrease) 36,000 units 2 capsules with                            meals and 1 with snacks. Samples given today. If                            this is more helpful  than Pancrease please let me                            know.                           - Await pathology results.                           - Repeat colonoscopy is recommended for                            surveillance. The colonoscopy date will be                            determined after pathology results from today's                            exam become available for review.                           - Office follow-up in 3 months. Jerene Bears, MD 11/08/2020 3:45:31 PM This report has been signed electronically.

## 2020-11-08 NOTE — Progress Notes (Signed)
Check-in-aer V/s-dt

## 2020-11-08 NOTE — Progress Notes (Signed)
Sedate, gd SR's, VSS, report to RN 

## 2020-11-10 ENCOUNTER — Telehealth: Payer: Self-pay | Admitting: *Deleted

## 2020-11-10 NOTE — Telephone Encounter (Signed)
  Follow up Call-  Call back number 11/08/2020  Post procedure Call Back phone  # [phone]  Permission to leave phone message Yes  Some recent data might be hidden     Patient questions:  Do you have a fever, pain , or abdominal swelling? No. Pain Score  0 *  Have you tolerated food without any problems? Yes.    Have you been able to return to your normal activities? Yes.    Do you have any questions about your discharge instructions: Diet   No. Medications  No. Follow up visit  No.  Do you have questions or concerns about your Care? No.  Actions: * If pain score is 4 or above: No action needed, pain <4.

## 2020-11-13 ENCOUNTER — Encounter: Payer: Self-pay | Admitting: *Deleted

## 2020-11-14 ENCOUNTER — Encounter: Payer: Self-pay | Admitting: Internal Medicine

## 2020-11-14 ENCOUNTER — Other Ambulatory Visit: Payer: Self-pay

## 2020-11-14 MED ORDER — PANCRELIPASE (LIP-PROT-AMYL) 36000-114000 UNITS PO CPEP: ORAL_CAPSULE | ORAL | 11 refills | Status: DC

## 2020-11-14 NOTE — Telephone Encounter (Signed)
Yes, Please send Creon with the knowledge that the initial product Pancreaze required by her insurance was ineffective Creon 36,000 units 2 capsules with meals, 1 with snacks I am glad to hear that her diarrhea has improved Thanks JMP  Dx is exocrine pancreatic insufficiency

## 2020-11-24 ENCOUNTER — Other Ambulatory Visit: Payer: Self-pay

## 2020-11-27 NOTE — Telephone Encounter (Signed)
We never received any additional correspondence from pharmacy about a reattempt for prior auth on Creon. I have sent creon prior auth request through [email or web address] and am awaiting a response from insurance.

## 2020-11-29 ENCOUNTER — Telehealth: Payer: Self-pay | Admitting: Internal Medicine

## 2020-11-29 MED ORDER — PANCREAZE 37000-97300 UNITS PO CPEP: 2.0000 | ORAL_CAPSULE | Freq: Three times a day (TID) | ORAL | 1 refills | Status: DC

## 2020-11-29 NOTE — Telephone Encounter (Signed)
I have left a message for patient indicating that we have contacted her insurance company again to try to get authorization on her creon since we have evidence that the pancreaz is not as effective for her. However, we are still awaiting response from insurance. In the meantime, I will send a pancreaz rx to the pharmacy since she is almost out of medication. I was attempting to see if she wanted script to go to local pharmacy or mail in. Since I was unable to reach her, I went ahead and sent script to local pharmacy so she can go ahead and purchase it.

## 2020-11-29 NOTE — Telephone Encounter (Signed)
See additional phone note dated 11/29/20

## 2020-11-30 MED ORDER — PANCRELIPASE (LIP-PROT-AMYL) 36000-114000 UNITS PO CPEP: ORAL_CAPSULE | ORAL | 1 refills | Status: DC

## 2020-11-30 NOTE — Addendum Note (Signed)
Addended by: Larina Bras on: 11/30/2020 11:11 AM   Modules accepted: Orders

## 2020-12-04 NOTE — Progress Notes (Signed)
Office Visit Note  Patient: Dawn Griffin             Date of Birth: 04-Apr-1970           MRN: [id number]             PCP: Hali Marry, MD Referring: Hali Marry, * Visit Date: 12/05/2020   Subjective:  Rheumatoid Arthritis (Improving, patient has not had COVID vaccine)   History of Present Illness: Dawn Griffin is a 50 y.o. female here for follow up for RA/CREST overlap syndrome currently on Orencia 125 mg Zeba weekly, HCQ 300 mg PO daily, MTX 15 mg PO weekly, and folic acid 1 mg daily. Since our last visit recommended trial of prednisone tapering off if tolerable. She also called to discuss starting wellbutrin to help smoking cessation. She discontinued prednisone without any major increase in problems. She feels great improvement in diarrhea after starting Creon after seeing Dr. Hilarie Fredrickson for GI despite negative testing for pancreatic insufficiency. She continues to have raynaud's symptoms regularly.   Previous HPI 09/06/20 Dawn Griffin is a 50 y.o. female here for follow up for RA/crest overlap syndrome currently on Orencia 125 mg subcu weekly, hydroxychloroquine 300 mg p.o. daily, methotrexate 15 mg p.o. weekly, prednisone 5 mg p.o. daily.  Since her last visit she developed a significant intolerance to the amlodipine and sildenafil tried for the Raynaud's symptoms and discontinued these medicine with resolution.  Overall she feels her joints are doing somewhat better with the continued Orencia treatment.  She has had annual pulmonology follow-up with stable pulmonary function testing.  The warmer weather has improved the frequency and severity of Raynaud's symptoms though these still occur when indoors.  She has still followed up with the vagal nerve stimulator RA trial but on discussion with the study rheumatologist may discontinue this.  She has some ongoing epigastric pain and nausea symptoms and has appointment for gastroenterology evaluation.  05/25/20 Dawn Griffin is a 50 y.o. female with history of carotid artery aneurysm and centrilobular emphysema here for evaluation of rheumatoid arthritis currently on methotrexate and orencia infusion. 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 She was first evaluated with rheumatology around 2014 due to hand pain, stiffness, skin changes, raynaud's, telangiectasias but not started any long term treatments until in 2018 with worsening symptoms was diagnosed with RA. She takes methotrexate and hydroxychloroquine for over a year without significant improvement in symptoms, mostly only feeling improvement when on low dose prednisone. Enbrel was added to her treatment but she does not feel this made a particularly good benefit in symptoms. She start Orencia infusion IV since about 2 months ago and also has not seen much improvement yet, although tolerating the treatment okay. She does not have much joint swelling most of the time, although in the past when she stopped all medicines for a time large bilateral knee swelling occurred and also hand swelling involving the whole hand and fingers. Doing okay today, currently on 10 mg prednisone she restarted taking it at 20 mg dose and decreased it feeling a bit worse but currently much better than off the medicine.   She had extensive cardiac workup in December with abnormal EKG and subsequent TTE without any obvious problems and cardiac CT checked with no coronary disease problems seen. She was also found to have carotid artery aneurysm during evaluation for vagal nerve stimulator placement as a participant with RESET-RA trial.   She has  skin thickening and Raynaud's numerous telangiectasias on hands bilaterally.  She is not currently on any medicine for this.  She has a few areas of small pitting or scarring.  She previously did not try calcium channel blocker due to concern of side effects with a fairly low baseline blood  pressure.  She has chronic diarrhea symptoms has had colonoscopy that was unremarkable.  She is also had swallowing study but does not describe prior upper GI endoscopy.   She has pulmonary emphysema but with good function on PFTs and is following up regularly with pulmonology.  This has been attributed to prior smoking history which she has to reduce but not stopped entirely.  She does have pretty frequent shortness of breath with exertion but no history of hypoxia or abnormal stress testing or PFTs.  She does not notice significant peripheral edema.   Labs reviewed 04/2016 ANA 1:1280 centromere RF 16.3 CCP 117   TTE Normal EF, tricuspid jet inadequate for estimation of RVSP  Review of Systems  Constitutional:  Positive for fatigue.  HENT:  Positive for mouth dryness.   Eyes:  Negative for dryness.  Respiratory:  Positive for shortness of breath.   Cardiovascular:  Negative for swelling in legs/feet.  Gastrointestinal:  Positive for diarrhea.  Endocrine: Positive for cold intolerance, heat intolerance and excessive thirst.  Genitourinary:  Negative for difficulty urinating.  Musculoskeletal:  Positive for joint pain, joint pain, joint swelling and morning stiffness.  Skin:  Negative for rash.  Allergic/Immunologic: Negative for susceptible to infections.  Neurological:  Positive for numbness.  Hematological:  Negative for bruising/bleeding tendency.  Psychiatric/Behavioral:  Positive for sleep disturbance.    PMFS History:  Patient Active Problem List   Diagnosis Date Noted   CREST syndrome ([place]) 07/06/2020   GERD (gastroesophageal reflux disease) 07/06/2020   High risk medication use 05/25/2020   Vitamin D deficiency 05/25/2020   S/P coil embolization of cerebral aneurysm 02/28/2020   Centrilobular emphysema ([place]) 08/04/2019   Chronic fatigue 05/10/2019   Brain fog 05/10/2019   Myofascial pain 06/30/2018   Rheumatoid arthritis ([place]) 05/20/2017   Bilateral hand numbness  08/17/2013   Arthritis of left carpometacarpal joint 02/26/2013   SOB (shortness of breath) 02/26/2013   Raynaud's syndrome with gangrene ([place]) 01/28/2013   ANEMIA, HX OF 01/10/2010   URINARY URGENCY 12/21/2009   LYMPHADENOPATHY 08/15/2008    Past Medical History:  Diagnosis Date   Aneurysm ([place]) 02/2020   Aortic atherosclerosis (HCC)    CREST (calcinosis, Raynaud's phenomenon, esophageal dysfunction, sclerodactyly, telangiectasia) ([place]) 2022   Emphysema of lung (HCC)    GERD (gastroesophageal reflux disease)    Irritable bowel syndrome with diarrhea    Pancreatic divisum    Raynaud disease    Rheumatoid arthritis ([place])    Sessile colonic polyp     Family History  Problem Relation Age of Onset   Bipolar disorder Mother    Lung cancer Mother    Lung cancer Father    Gout Father    Healthy Sister    Lupus Sister    Healthy Brother    Arthritis/Rheumatoid Maternal Grandmother    Stomach cancer Paternal Grandmother    Colon cancer Neg Hx    Esophageal cancer Neg Hx    Colon polyps Neg Hx    Rectal cancer Neg Hx    Past Surgical History:  Procedure Laterality Date   ANEURYSM COILING  02/2020   FINGER ARTHROSCOPY WITH CARPOMETACARPEL ([place]) ARTHROPLASTY Left 03/2019   LUMBAR SPINE  SURGERY  2001   L4-5-S1   TONSILLECTOMY  1990   VAGUS NERVE STIMULATOR INSERTION  09/2019   Social History   Social History Narrative   No exercise. + daily caffeine.    Immunization History  Administered Date(s) Administered   Influenza Whole 01/10/2010   Tdap 01/11/2013     Objective: Vital Signs: BP 109/62 (BP Location: Left Arm, Patient Position: Sitting, Cuff Size: Normal)   Pulse 81   Resp 14   Ht 5\' 4"  (1.626 m)   Wt 119 lb 12.8 oz (54.3 kg)   BMI 20.56 kg/m    Physical Exam Cardiovascular:     Rate and Rhythm: Normal rate and regular rhythm.  Pulmonary:     Effort: Pulmonary effort is normal.     Breath sounds: Normal breath sounds.  Skin:    General: Skin is warm  and dry.     Comments: Numerous telangiectasias present throughout especially on volar surfaces of hands Small areas of pitting with scab formation on digital pads Skin thickening throughout digits of both hands distal to MCP joints  Neurological:     Mental Status: She is alert.  Psychiatric:        Mood and Affect: Mood normal.     Musculoskeletal Exam:  Shoulders full ROM no tenderness or swelling Elbows full ROM no tenderness or swelling Wrists full ROM no tenderness or swelling 1st CMC joint squaring bilaterally and MCP bony enlargement, no palpable synovitis Bilateral knee crepitus with full range of motion no focal tenderness or swelling Ankles full ROM no tenderness or swelling  CDAI Score: 5    Investigation: No additional findings.  Imaging: No results found.  Recent Labs: Lab Results  Component Value Date   WBC 9.1 12/05/2020   HGB 13.4 12/05/2020   PLT 264 12/05/2020   NA 140 12/05/2020   K 4.8 12/05/2020   CL 106 12/05/2020   CO2 26 12/05/2020   GLUCOSE 91 12/05/2020   BUN 10 12/05/2020   CREATININE 0.73 12/05/2020   BILITOT 0.4 12/05/2020   ALKPHOS 38 03/18/2016   AST 32 12/05/2020   ALT 58 (H) 12/05/2020   PROT 6.4 12/05/2020   ALBUMIN 4.0 03/18/2016   CALCIUM 9.6 12/05/2020   GFRAA 102 09/06/2020    Speciality Comments: Patient is scheduled for PLQ eye exam: 10/06/2020  Procedures:  No procedures performed Allergies: Patient has no known allergies.   Assessment / Plan:     Visit Diagnoses: Rheumatoid arthritis involving multiple sites with positive rheumatoid factor (HCC)  CREST syndrome (HCC) - Plan: Sedimentation rate, folic acid (FOLVITE) 1 MG tablet, hydroxychloroquine (PLAQUENIL) 200 MG tablet  Skin disease appears stable, GI symptoms are improved with pancreatic supplementation, no significant synovitis although there is still some joint tenderness and chronic changes.  Overall definite improvement since addition of the Orencia.   Checking sedimentation rate for disease activity monitoring.  Recommend decreasing hydroxychloroquine to 200 mg p.o. daily due to patient weight decrease.  Continue abatacept at 125 mg subcu weekly.  Patient questions need for long-term methotrexate or possible dose adjustment, plan to continue current 15 mg PO weekly for now and folic acid 1 mg daily.  Raynaud's syndrome with gangrene ([place])  Continued Raynaud's symptoms with small areas of pitting no large ulcerations lesions or infections.  She did not tolerate trial of amlodipine or sildenafil.  Not interested in trying other oral systemic medications at this time.  She has been previously recommended topical nitrates but does not use  these regularly concern for systemic effects.  I recommend these to be reasonable to try and discussed applying proximal to the involved areas such as at the digital webs.  High risk medication use - Plan: CBC with Differential/Platelet, COMPLETE METABOLIC PANEL WITH GFR  For ongoing use of methotrexate we will check CBC and CMP today for toxicity monitoring.  She has followed up with ophthalmology no concerning findings for hydroxychloroquine retinal toxicity.    Orders: Orders Placed This Encounter  Procedures   CBC with Differential/Platelet   COMPLETE METABOLIC PANEL WITH GFR   Sedimentation rate    Meds ordered this encounter  Medications   DISCONTD: methotrexate (RHEUMATREX) 2.5 MG tablet    Sig: Take 6 tablets (15 mg total) by mouth once a week.    Dispense:  78 tablet    Refill:  0   folic acid (FOLVITE) 1 MG tablet    Sig: Take 1 tablet (1 mg total) by mouth daily.    Dispense:  90 tablet    Refill:  0   hydroxychloroquine (PLAQUENIL) 200 MG tablet    Sig: Take 1 tablet (200 mg total) by mouth daily.    Dispense:  90 tablet    Refill:  0     Follow-Up Instructions: Return in about 3 months (around 03/06/2021) for RA/CREST overlap on ABA+MTX+HCQ f/u 62mos.   Collier Salina,  MD  Note - This record has been created using [employer].  Chart creation errors have been sought, but may not always  have been located. Such creation errors do not reflect on  the standard of medical care.

## 2020-12-05 ENCOUNTER — Other Ambulatory Visit: Payer: Self-pay

## 2020-12-05 ENCOUNTER — Encounter: Payer: Self-pay | Admitting: Internal Medicine

## 2020-12-05 ENCOUNTER — Ambulatory Visit: Payer: Managed Care, Other (non HMO) | Admitting: Internal Medicine

## 2020-12-05 VITALS — BP 109/62 | HR 81 | Resp 14 | Ht 64.0 in | Wt 119.8 lb

## 2020-12-05 DIAGNOSIS — I7301 Raynaud's syndrome with gangrene: Secondary | ICD-10-CM

## 2020-12-05 DIAGNOSIS — M0579 Rheumatoid arthritis with rheumatoid factor of multiple sites without organ or systems involvement: Secondary | ICD-10-CM

## 2020-12-05 DIAGNOSIS — M341 CR(E)ST syndrome: Secondary | ICD-10-CM

## 2020-12-05 DIAGNOSIS — Z79899 Other long term (current) drug therapy: Secondary | ICD-10-CM

## 2020-12-05 MED ORDER — METHOTREXATE 2.5 MG PO TABS: 15.0000 mg | ORAL_TABLET | ORAL | 0 refills | Status: DC

## 2020-12-05 MED ORDER — HYDROXYCHLOROQUINE SULFATE 200 MG PO TABS: 200.0000 mg | ORAL_TABLET | Freq: Every day | ORAL | 0 refills | Status: DC

## 2020-12-05 MED ORDER — FOLIC ACID 1 MG PO TABS: 1.0000 mg | ORAL_TABLET | Freq: Every day | ORAL | 0 refills | Status: DC

## 2020-12-06 LAB — CBC WITH DIFFERENTIAL/PLATELET
Absolute Monocytes: 582 cells/uL (ref 200–950)
Basophils Absolute: 64 cells/uL (ref 0–200)
Basophils Relative: 0.7 %
Eosinophils Absolute: 191 cells/uL (ref 15–500)
Eosinophils Relative: 2.1 %
HCT: 41.5 % (ref 35.0–45.0)
Hemoglobin: 13.4 g/dL (ref 11.7–15.5)
Lymphs Abs: 3049 cells/uL (ref 850–3900)
MCH: 32.1 pg (ref 27.0–33.0)
MCHC: 32.3 g/dL (ref 32.0–36.0)
MCV: 99.5 fL (ref 80.0–100.0)
MPV: 10.3 fL (ref 7.5–12.5)
Monocytes Relative: 6.4 %
Neutro Abs: 5214 cells/uL (ref 1500–7800)
Neutrophils Relative %: 57.3 %
Platelets: 264 10*3/uL (ref 140–400)
RBC: 4.17 10*6/uL (ref 3.80–5.10)
RDW: 12.5 % (ref 11.0–15.0)
Total Lymphocyte: 33.5 %
WBC: 9.1 10*3/uL (ref 3.8–10.8)

## 2020-12-06 LAB — COMPLETE METABOLIC PANEL WITH GFR
AG Ratio: 2.4 (calc) (ref 1.0–2.5)
ALT: 58 U/L — ABNORMAL HIGH (ref 6–29)
AST: 32 U/L (ref 10–35)
Albumin: 4.5 g/dL (ref 3.6–5.1)
Alkaline phosphatase (APISO): 52 U/L (ref 31–125)
BUN: 10 mg/dL (ref 7–25)
CO2: 26 mmol/L (ref 20–32)
Calcium: 9.6 mg/dL (ref 8.6–10.2)
Chloride: 106 mmol/L (ref 98–110)
Creat: 0.73 mg/dL (ref 0.50–0.99)
Globulin: 1.9 g/dL (calc) (ref 1.9–3.7)
Glucose, Bld: 91 mg/dL (ref 65–99)
Potassium: 4.8 mmol/L (ref 3.5–5.3)
Sodium: 140 mmol/L (ref 135–146)
Total Bilirubin: 0.4 mg/dL (ref 0.2–1.2)
Total Protein: 6.4 g/dL (ref 6.1–8.1)
eGFR: 101 mL/min/{1.73_m2} (ref >=60)

## 2020-12-06 LAB — SEDIMENTATION RATE: Sed Rate: 2 mm/h (ref 0–20)

## 2020-12-07 ENCOUNTER — Other Ambulatory Visit: Payer: Self-pay | Admitting: *Deleted

## 2020-12-07 DIAGNOSIS — Z79899 Other long term (current) drug therapy: Secondary | ICD-10-CM

## 2020-12-07 DIAGNOSIS — R7989 Other specified abnormal findings of blood chemistry: Secondary | ICD-10-CM

## 2020-12-07 MED ORDER — METHOTREXATE 2.5 MG PO TABS: 10.0000 mg | ORAL_TABLET | ORAL | 0 refills | Status: DC

## 2020-12-07 NOTE — Telephone Encounter (Signed)
Email turned to encounter because -- please check on Creon -- it was more effective than Pancrease for her and was supposedly approved.  Can we see what the problem is with her getting this med.  Please let her know that the Pancrease typically does not cause elevated LFTs.  I looked up the possible side effects and elevated LFTs were not listed. MTX can certainly cause elevated ALT This should be watched to ensure it normalizes and if not I can look into it further. In the meantime I would continue the pancreatic enzyme replacement and continue plan to substitute Creon for Pancrease.

## 2020-12-07 NOTE — Progress Notes (Signed)
Liver function test showed AST is 58 which is twice the normal level. I am not sure whether other changes to her medicines can affect this. I recommend she decrease methotrexate dose can go down to 4 tablets weekly instead of 6 and we should recheck the LFTs 2-4 weeks later.

## 2020-12-07 NOTE — Telephone Encounter (Signed)
-----   Message from Collier Salina, MD sent at 12/07/2020  7:59 AM EDT ----- Liver function test showed AST is 58 which is twice the normal level. I am not sure whether other changes to her medicines can affect this. I recommend she decrease methotrexate dose can go down to 4 tablets weekly instead of 6 and we should recheck the LFTs 2-4 weeks later.

## 2020-12-08 NOTE — Telephone Encounter (Signed)
Creon has already been taken care of. I completed a prior authorization for the creon indicating that pancreaz was ineffective. Patient states her Creon came in the mail yesterday.

## 2020-12-12 DIAGNOSIS — M349 Systemic sclerosis, unspecified: Secondary | ICD-10-CM | POA: Insufficient documentation

## 2020-12-12 DIAGNOSIS — M05742 Rheumatoid arthritis with rheumatoid factor of left hand without organ or systems involvement: Secondary | ICD-10-CM | POA: Insufficient documentation

## 2020-12-13 ENCOUNTER — Encounter: Payer: Self-pay | Admitting: Internal Medicine

## 2020-12-14 ENCOUNTER — Telehealth: Payer: Self-pay | Admitting: Pharmacist

## 2020-12-14 NOTE — Telephone Encounter (Signed)
Received fax from [facility] for prior authorization clinical questions for Orencia. Completed forms and awaiting Dr. Marveen Reeks completion of OV note from 12/05/20. Once signed, can print and submit with Josem Kaufmann renewal  Fax: [phone]  Knox Saliva, PharmD, MPH, BCPS Clinical Pharmacist (Rheumatology and Pulmonology)

## 2020-12-14 NOTE — Telephone Encounter (Signed)
Sorry about that, it is signed now.

## 2020-12-14 NOTE — Telephone Encounter (Signed)
Submitted a Prior Authorization request via fax for ORENCIA SQ to [facility] along with OV notes, and clinical questions that were faxed. Will update once we receive a response.  Fax: [phone] Phone: [phone]  Knox Saliva, PharmD, MPH, BCPS Clinical Pharmacist (Rheumatology and Pulmonology)

## 2020-12-15 NOTE — Telephone Encounter (Signed)
Received fax from [facility] stating no PA is required for [facility]

## 2020-12-18 ENCOUNTER — Other Ambulatory Visit (HOSPITAL_COMMUNITY): Payer: Self-pay

## 2020-12-19 ENCOUNTER — Telehealth: Payer: Self-pay

## 2020-12-19 NOTE — Telephone Encounter (Signed)
Dr. Opal Sidles Box from [facility] called stating Dawn Griffin is a mutual patient in our reset study and wanted to give Dr. Benjamine Mola additional information.  Cell [phone]

## 2020-12-21 ENCOUNTER — Other Ambulatory Visit: Payer: Self-pay | Admitting: Internal Medicine

## 2020-12-21 DIAGNOSIS — M0579 Rheumatoid arthritis with rheumatoid factor of multiple sites without organ or systems involvement: Secondary | ICD-10-CM

## 2020-12-21 NOTE — Telephone Encounter (Signed)
Next Visit: 03/06/2021  Last Visit: 12/05/2020  Last Fill: 07/11/2020  MV:HQIONGEXBM arthritis involving multiple sites with positive rheumatoid factor   Current Dose per office note 12/05/2020: Continue abatacept at 125 mg subcu weekly.  Labs: 12/05/2020 Liver function test showed AST is 58 which is twice the normal level.  TB Gold: 02/22/2020 Neg    Okay to refill Orencia?

## 2020-12-26 ENCOUNTER — Other Ambulatory Visit: Payer: Self-pay | Admitting: *Deleted

## 2020-12-26 DIAGNOSIS — Z79899 Other long term (current) drug therapy: Secondary | ICD-10-CM

## 2020-12-26 DIAGNOSIS — R7989 Other specified abnormal findings of blood chemistry: Secondary | ICD-10-CM

## 2020-12-27 LAB — ALT: ALT: 67 U/L — ABNORMAL HIGH (ref 6–29)

## 2020-12-27 LAB — AST: AST: 30 U/L (ref 10–35)

## 2020-12-27 NOTE — Progress Notes (Signed)
ALT, one of the liver enzyme is more increased at 67 from 58, this was 22 three months ago. I do think she should decrease the weekly methotrexate dose to be safe with this. I recommend going to 4 tablets once weekly. I don't think there is much benefit to split dosing at lower doses it should all be well absorbed at once.

## 2020-12-29 ENCOUNTER — Telehealth: Payer: Self-pay | Admitting: *Deleted

## 2020-12-29 DIAGNOSIS — R7989 Other specified abnormal findings of blood chemistry: Secondary | ICD-10-CM

## 2020-12-29 DIAGNOSIS — Z79899 Other long term (current) drug therapy: Secondary | ICD-10-CM

## 2020-12-29 NOTE — Progress Notes (Signed)
Ideally repeat labs in 4 weeks after dose decrease really just need LFTs.

## 2020-12-29 NOTE — Telephone Encounter (Signed)
-----   Message from Collier Salina, MD sent at 12/29/2020  4:27 PM EDT ----- Ideally repeat labs in 4 weeks after dose decrease really just need LFTs.

## 2021-01-16 ENCOUNTER — Other Ambulatory Visit: Payer: Self-pay

## 2021-01-16 DIAGNOSIS — R7989 Other specified abnormal findings of blood chemistry: Secondary | ICD-10-CM

## 2021-01-16 DIAGNOSIS — Z79899 Other long term (current) drug therapy: Secondary | ICD-10-CM

## 2021-01-16 LAB — ALT: ALT: 33 U/L — ABNORMAL HIGH (ref 6–29)

## 2021-01-16 LAB — AST: AST: 25 U/L (ref 10–35)

## 2021-01-18 ENCOUNTER — Telehealth: Payer: Self-pay | Admitting: Family Medicine

## 2021-01-18 ENCOUNTER — Encounter: Payer: Self-pay | Admitting: Internal Medicine

## 2021-01-18 DIAGNOSIS — N63 Unspecified lump in unspecified breast: Secondary | ICD-10-CM

## 2021-01-18 NOTE — Telephone Encounter (Signed)
Pt called this afternoon asking if you could send a referral to [facility]. I told her if we need to schedule an appt for this then someone would call her back. She said that this is for something that you have been keeping an eye on for about a year now.

## 2021-01-18 NOTE — Telephone Encounter (Signed)
According to her last imaging they did not recommend follow-up for at least 12 months which would be April 2023.  But if she is having some concerns we could certainly refer her back sooner.  RECOMMENDATION: Bilateral diagnostic mammogram, and LEFT breast ultrasound, in 12 months

## 2021-01-19 NOTE — Telephone Encounter (Signed)
Orders placed.  Charyl Bigger, CMA

## 2021-02-05 ENCOUNTER — Other Ambulatory Visit: Payer: Self-pay | Admitting: Family Medicine

## 2021-02-05 ENCOUNTER — Ambulatory Visit
Admission: RE | Admit: 2021-02-05 | Discharge: 2021-02-05 | Disposition: A | Discharge status: Home / Self Care | Payer: Managed Care, Other (non HMO) | Source: Ambulatory Visit | Attending: Family Medicine | Admitting: Family Medicine

## 2021-02-05 ENCOUNTER — Other Ambulatory Visit: Payer: Self-pay

## 2021-02-05 DIAGNOSIS — N63 Unspecified lump in unspecified breast: Secondary | ICD-10-CM

## 2021-02-08 ENCOUNTER — Encounter: Payer: Self-pay | Admitting: Family Medicine

## 2021-02-14 ENCOUNTER — Encounter: Payer: Self-pay | Admitting: Internal Medicine

## 2021-02-14 ENCOUNTER — Ambulatory Visit: Payer: Managed Care, Other (non HMO) | Admitting: Internal Medicine

## 2021-02-14 ENCOUNTER — Ambulatory Visit (INDEPENDENT_AMBULATORY_CARE_PROVIDER_SITE_OTHER): Payer: Managed Care, Other (non HMO) | Admitting: Internal Medicine

## 2021-02-14 VITALS — BP 110/72 | HR 86 | Ht 64.0 in | Wt 121.0 lb

## 2021-02-14 DIAGNOSIS — K8689 Other specified diseases of pancreas: Secondary | ICD-10-CM

## 2021-02-14 DIAGNOSIS — Z86010 Personal history of colonic polyps: Secondary | ICD-10-CM

## 2021-02-14 DIAGNOSIS — K2970 Gastritis, unspecified, without bleeding: Secondary | ICD-10-CM

## 2021-02-14 DIAGNOSIS — K2990 Gastroduodenitis, unspecified, without bleeding: Secondary | ICD-10-CM

## 2021-02-14 DIAGNOSIS — K219 Gastro-esophageal reflux disease without esophagitis: Secondary | ICD-10-CM

## 2021-02-14 MED ORDER — PANCRELIPASE (LIP-PROT-AMYL) 36000-114000 UNITS PO CPEP: ORAL_CAPSULE | ORAL | 2 refills | Status: DC

## 2021-02-14 NOTE — Patient Instructions (Addendum)
Continue taking Famotidine 20mg  -Take 1-2 tablets by mouth daily as needed.   Creon - Take 2 capsules with each meal and 1 capsule with each snack daily.( Up to a total of 10 capsules daily.)  We have sent the following medications to your pharmacy for you to pick up at your convenience: Creon- [facility].   Follow- up 1 year. Sooner if needed.    If you are age 49 or younger, your body mass index should be between 19-25. Your Body mass index is 20.77 kg/m. If this is out of the aformentioned range listed, please consider follow up with your Primary Care Provider.   ________________________________________________________  The [facility] providers would like to encourage you to use [facility] to communicate with providers for non-urgent requests or questions.  Due to long hold times on the telephone, sending your provider a message by [facility] may be a faster and more efficient way to get a response.  Please allow 48 business hours for a response.  Please remember that this is for non-urgent requests.   Thank you for choosing me and [facility].  Dr. Hilarie Fredrickson

## 2021-02-14 NOTE — Progress Notes (Signed)
Subjective:    Patient ID: Dawn Griffin, female    DOB: Nov 29, 1970, 50 y.o.   MRN: [id number]  HPI Dawn Griffin is a 50 year old female with a history of EPI, sessile serrated colon polyps, GERD, rheumatoid arthritis and scleroderma who is here for follow-up.  She was initially seen in July and then came for upper endoscopy and colonoscopy which was performed in late August 2022.  See EGD and colonoscopy from 11/08/2020 for details.  She reports that she is much improved with initiation of Creon.  She is using 2 capsules with meals, 1 with snack.  She sometimes will not use an enzyme capsule if it is a small snack like a cookie.  Her stools vary in number of times per day from 1-2 all the way up to as many as 4-5.  Even if she goes multiple times per day the stools are formed.  The diarrhea is gone.  Her life is much improved as she is not always looking for a bathroom.  She has some heartburn and will use Rolaids despite Pepcid 20 mg twice daily.  No dysphagia or odynophagia.  She has had reduction in her methotrexate dose because she had a slightly elevated ALT.  She was previously on 20 mg and then 15 mg.  She is now on 10 mg the direction of Dr. Benjamine Mola.  Review of Systems As per HPI, otherwise negative  Current Medications, Allergies, Past Medical History, Past Surgical History, Family History and Social History were reviewed in [employer] record.    Objective:   Physical Exam BP 110/72   Pulse 86   Ht 5\' 4"  (1.626 m)   Wt 121 lb (54.9 kg)   SpO2 98%   BMI 20.77 kg/m  Gen: awake, alert, NAD HEENT: anicteric Neuro: nonfocal  CMP     Component Value Date/Time   NA 140 12/05/2020 1120   K 4.8 12/05/2020 1120   CL 106 12/05/2020 1120   CO2 26 12/05/2020 1120   GLUCOSE 91 12/05/2020 1120   BUN 10 12/05/2020 1120   CREATININE 0.73 12/05/2020 1120   CALCIUM 9.6 12/05/2020 1120   PROT 6.4 12/05/2020 1120   ALBUMIN 4.0 03/18/2016 0803   AST 25  01/16/2021 1208   ALT 33 (H) 01/16/2021 1208   ALKPHOS 38 03/18/2016 0803   BILITOT 0.4 12/05/2020 1120   GFRNONAA 88 09/06/2020 1405   GFRAA 102 09/06/2020 1405   CBC    Component Value Date/Time   WBC 9.1 12/05/2020 1120   RBC 4.17 12/05/2020 1120   HGB 13.4 12/05/2020 1120   HCT 41.5 12/05/2020 1120   PLT 264 12/05/2020 1120   MCV 99.5 12/05/2020 1120   MCH 32.1 12/05/2020 1120   MCHC 32.3 12/05/2020 1120   RDW 12.5 12/05/2020 1120   LYMPHSABS 3,049 12/05/2020 1120   MONOABS 518 03/18/2016 0803   EOSABS 191 12/05/2020 1120   BASOSABS 64 12/05/2020 1120        Assessment & Plan:  50 year old female with a history of EPI, sessile serrated colon polyps, GERD, rheumatoid arthritis and scleroderma who is here for follow-up.   Exocrine pancreatic insufficiency --chronic diarrhea as well as some of the abdominal lower crampy pain has resolved with the pancreatic enzymes.  We discussed continuing this therapy.  Also encouraged her to definitively take an enzyme capsule with snacks particularly fat containing foods.  If she has a heavy meal she may even need 3 capsules with food. --Creon 36,000 units,  2 capsules with meals, 1 with snacks; supply enough for 10 capsules/day --Daily multivitamin recommended  2.  GERD/gastritis --gastritis seen on EGD, H. pylori negative.  Symptoms mostly controlled with famotidine 20 mg twice daily.  She can increase to 40 mg if needed for breakthrough symptoms --Famotidine 20 mg; 1 to 2 tablets every 12 hours  3.  History of sessile serrated and adenomatous colon polyps --recent colonoscopy with surveillance recommended August 2025  Annual follow-up  30 minutes total spent today including patient facing time, coordination of care, reviewing medical history/procedures/pertinent radiology studies, and documentation of the encounter.

## 2021-02-23 ENCOUNTER — Other Ambulatory Visit: Payer: Managed Care, Other (non HMO)

## 2021-02-25 NOTE — Progress Notes (Signed)
Office Visit Note  Patient: Dawn Griffin             Date of Birth: June 24, 1970           MRN: [id number]             PCP: Hali Marry, MD Referring: Hali Marry, * Visit Date: 02/26/2021   Subjective:   History of Present Illness: Dawn Griffin is a 50 y.o. 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 We decreased methotrexate dose after last visit due to elevation in liver function tests.  Overall she feels her arthritis and scleroderma symptoms are pretty well controlled.  She is experiencing increased verity of Raynaud's symptoms with some new digital pitting during the colder weather but is very proactive with coats gloves and hot hands heating packs.  Continuing to have good GI symptom improvement with the Creon.  Overall she feels her mood and body aches are just overall worse and feels very down which she states has come and gone periodically in previous months.  Previous HPI 12/05/20 Dawn Griffin is a 50 y.o. female here for follow up for RA/CREST overlap syndrome currently on Orencia 125 mg [place] weekly, HCQ 300 mg PO daily, MTX 15 mg PO weekly, and folic acid 1 mg daily. Since our last visit recommended trial of prednisone tapering off if tolerable. She also called to discuss starting wellbutrin to help smoking cessation. She discontinued prednisone without any major increase in problems. She feels great improvement in diarrhea after starting Creon after seeing Dr. Hilarie Fredrickson for GI despite negative testing for pancreatic insufficiency. She continues to have raynaud's symptoms regularly.   Previous HPI 05/25/20 Dawn Griffin is a 50 y.o. female with history of carotid artery aneurysm and centrilobular emphysema here for evaluation of rheumatoid arthritis currently on methotrexate and orencia infusion. 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 She was first evaluated with rheumatology around 2014 due to hand pain, stiffness, skin changes, raynaud's, telangiectasias but not started any long term treatments until in 2018 with worsening symptoms was diagnosed with RA. She takes methotrexate and hydroxychloroquine for over a year without significant improvement in symptoms, mostly only feeling improvement when on low dose prednisone. Enbrel was added to her treatment but she does not feel this made a particularly good benefit in symptoms. She start Orencia infusion IV since about 2 months ago and also has not seen much improvement yet, although tolerating the treatment okay. She does not have much joint swelling most of the time, although in the past when she stopped all medicines for a time large bilateral knee swelling occurred and also hand swelling involving the whole hand and fingers. Doing okay today, currently on 10 mg prednisone she restarted taking it at 20 mg dose and decreased it feeling a bit worse but currently much better than off the medicine.   She had extensive cardiac workup in December with abnormal EKG and subsequent TTE without any obvious problems and cardiac CT checked with no coronary disease problems seen. She was also found to have carotid artery aneurysm during evaluation for vagal nerve stimulator placement as a participant with RESET-RA trial.   She has skin thickening and Raynaud's numerous telangiectasias on hands bilaterally.  She is not currently on any medicine for this.  She has a few areas  of small pitting or scarring.  She previously did not try calcium channel blocker due to concern of side effects with a fairly low baseline blood pressure.  She has chronic diarrhea symptoms has had colonoscopy that was unremarkable.  She is also had swallowing study but does not describe prior upper GI endoscopy.   She has pulmonary emphysema but with good function on PFTs and is following up  regularly with pulmonology.  This has been attributed to prior smoking history which she has to reduce but not stopped entirely.  She does have pretty frequent shortness of breath with exertion but no history of hypoxia or abnormal stress testing or PFTs.  She does not notice significant peripheral edema.   Labs reviewed 04/2016 ANA 1:1280 centromere RF 16.3 CCP 117    TTE Normal EF, tricuspid jet inadequate for estimation of RVSP   Review of Systems  Constitutional:  Positive for fatigue.  Respiratory:  Negative for shortness of breath.   Cardiovascular:  Negative for chest pain.  Gastrointestinal:  Positive for diarrhea.  Musculoskeletal:  Positive for joint pain, joint pain and morning stiffness.  Skin:  Positive for pallor and skin tightness.  Psychiatric/Behavioral:  Positive for depressed mood.    PMFS History:  Patient Active Problem List   Diagnosis Date Noted   CREST syndrome ([place]) 07/06/2020   GERD (gastroesophageal reflux disease) 07/06/2020   High risk medication use 05/25/2020   Vitamin D deficiency 05/25/2020   S/P coil embolization of cerebral aneurysm 02/28/2020   Centrilobular emphysema ([place]) 08/04/2019   Chronic fatigue 05/10/2019   Brain fog 05/10/2019   Myofascial pain 06/30/2018   Rheumatoid arthritis ([place]) 05/20/2017   Bilateral hand numbness 08/17/2013   Arthritis of left carpometacarpal joint 02/26/2013   SOB (shortness of breath) 02/26/2013   Raynaud's syndrome with gangrene ([place]) 01/28/2013   ANEMIA, HX OF 01/10/2010   URINARY URGENCY 12/21/2009   LYMPHADENOPATHY 08/15/2008    Past Medical History:  Diagnosis Date   Aneurysm ([place]) 02/2020   Aortic atherosclerosis (HCC)    CREST (calcinosis, Raynaud's phenomenon, esophageal dysfunction, sclerodactyly, telangiectasia) ([place]) 2022   Emphysema of lung (HCC)    GERD (gastroesophageal reflux disease)    Irritable bowel syndrome with diarrhea    Pancreatic divisum    Raynaud disease    Rheumatoid  arthritis ([place])    Sessile colonic polyp     Family History  Problem Relation Age of Onset   Bipolar disorder Mother    Lung cancer Mother    Lung cancer Father    Gout Father    Healthy Sister    Lupus Sister    Healthy Brother    Arthritis/Rheumatoid Maternal Grandmother    Stomach cancer Paternal Grandmother    Colon cancer Neg Hx    Esophageal cancer Neg Hx    Colon polyps Neg Hx    Rectal cancer Neg Hx    Past Surgical History:  Procedure Laterality Date   ANEURYSM COILING  02/2020   FINGER ARTHROSCOPY WITH CARPOMETACARPEL ([place]) ARTHROPLASTY Left 03/2019   LUMBAR SPINE SURGERY  2001   L4-5-S1   TONSILLECTOMY  1990   VAGUS NERVE STIMULATOR INSERTION  09/2019   Social History   Social History Narrative   No exercise. + daily caffeine.    Immunization History  Administered Date(s) Administered   Influenza Whole 01/10/2010   Tdap 01/11/2013     Objective: Vital Signs: BP 108/71 (BP Location: Left Arm, Patient Position: Sitting, Cuff Size: Small)  Pulse 69    Resp 12    Ht 5\' 4"  (1.626 m)    Wt 123 lb 3.2 oz (55.9 kg)    BMI 21.15 kg/m    Physical Exam HENT:     Mouth/Throat:     Mouth: Mucous membranes are moist.     Pharynx: Oropharynx is clear.  Eyes:     Conjunctiva/sclera: Conjunctivae normal.  Cardiovascular:     Rate and Rhythm: Normal rate and regular rhythm.  Pulmonary:     Effort: Pulmonary effort is normal.     Breath sounds: Normal breath sounds.  Musculoskeletal:     Right lower leg: No edema.     Left lower leg: No edema.  Skin:    General: Skin is warm and dry.     Comments: Numerous telangiectasias present throughout especially on volar surfaces of hands fingertips and lips Small areas of pitting with scab formation on fingertips Skin thickening throughout digits of both hands distal to MCP joints   Neurological:     Mental Status: She is alert.  Psychiatric:        Mood and Affect: Mood normal.     Musculoskeletal Exam:   Shoulders full ROM no tenderness or swelling Elbows full ROM no tenderness or swelling Wrists full ROM no tenderness or swelling Fingers with first CMC joint squaring bilaterally mild proximal finger joint enlargement no active palpable synovitis Knees full ROM bilateral patellofemoral crepitus with no effusions   Investigation: No additional findings.  Imaging: US BREAST LTD UNI LEFT INC AXILLA  Result Date: 02/05/2021 CLINICAL DATA:  49 year old female being followed for LEFT breast mass, now with possible enlargement per patient examination. EXAM: DIGITAL DIAGNOSTIC UNILATERAL LEFT MAMMOGRAM WITH TOMOSYNTHESIS AND CAD; ULTRASOUND LEFT BREAST LIMITED TECHNIQUE: Left digital diagnostic mammography and breast tomosynthesis was performed. The images were evaluated with computer-aided detection.; Targeted ultrasound examination of the left breast was performed. COMPARISON:  Previous exam(s). ACR Breast Density Category c: The breast tissue is heterogeneously dense, which may obscure small masses. FINDINGS: 2D/3D full field and spot compression views of the LEFT breast again demonstrates a circumscribed oval mass within the LOWER OUTER LEFT breast. No new findings are noted. Targeted ultrasound is performed, showing a 1.4 x 0.8 x 1.4 cm circumscribed oval hypoechoic parallel mass at the 5 o'clock position of the LEFT breast 4 cm from the nipple, which may be slightly enlarged since 06/16/2020 but unchanged in size from 12/16/2019. IMPRESSION: 1. No significant change in LOWER OUTER LEFT breast mass/probable fibroadenoma. One additional follow-up in 6 months is recommended to ensure 2 year stability. RECOMMENDATION: Bilateral diagnostic mammogram and LEFT breast ultrasound in 6 months. I have discussed the findings and recommendations with the patient. If applicable, a reminder letter will be sent to the patient regarding the next appointment. BI-RADS CATEGORY  3: Probably benign. Electronically Signed    By: Margarette Canada M.D.   On: 02/05/2021 13:27  MM DIAG BREAST TOMO UNI LEFT  Result Date: 02/05/2021 CLINICAL DATA:  50 year old female being followed for LEFT breast mass, now with possible enlargement per patient examination. EXAM: DIGITAL DIAGNOSTIC UNILATERAL LEFT MAMMOGRAM WITH TOMOSYNTHESIS AND CAD; ULTRASOUND LEFT BREAST LIMITED TECHNIQUE: Left digital diagnostic mammography and breast tomosynthesis was performed. The images were evaluated with computer-aided detection.; Targeted ultrasound examination of the left breast was performed. COMPARISON:  Previous exam(s). ACR Breast Density Category c: The breast tissue is heterogeneously dense, which may obscure small masses. FINDINGS: 2D/3D full field and spot compression views  of the LEFT breast again demonstrates a circumscribed oval mass within the LOWER OUTER LEFT breast. No new findings are noted. Targeted ultrasound is performed, showing a 1.4 x 0.8 x 1.4 cm circumscribed oval hypoechoic parallel mass at the 5 o'clock position of the LEFT breast 4 cm from the nipple, which may be slightly enlarged since 06/16/2020 but unchanged in size from 12/16/2019. IMPRESSION: 1. No significant change in LOWER OUTER LEFT breast mass/probable fibroadenoma. One additional follow-up in 6 months is recommended to ensure 2 year stability. RECOMMENDATION: Bilateral diagnostic mammogram and LEFT breast ultrasound in 6 months. I have discussed the findings and recommendations with the patient. If applicable, a reminder letter will be sent to the patient regarding the next appointment. BI-RADS CATEGORY  3: Probably benign. Electronically Signed   By: Margarette Canada M.D.   On: 02/05/2021 13:27   Recent Labs: Lab Results  Component Value Date   WBC 9.1 12/05/2020   HGB 13.4 12/05/2020   PLT 264 12/05/2020   NA 140 12/05/2020   K 4.8 12/05/2020   CL 106 12/05/2020   CO2 26 12/05/2020   GLUCOSE 91 12/05/2020   BUN 10 12/05/2020   CREATININE 0.73 12/05/2020   BILITOT  0.4 12/05/2020   ALKPHOS 38 03/18/2016   AST 25 01/16/2021   ALT 33 (H) 01/16/2021   PROT 6.4 12/05/2020   ALBUMIN 4.0 03/18/2016   CALCIUM 9.6 12/05/2020   GFRAA 102 09/06/2020    Speciality Comments: Patient is scheduled for PLQ eye exam: 10/06/2020  Procedures:  No procedures performed Allergies: Patient has no known allergies.   Assessment / Plan:     Visit Diagnoses: Rheumatoid arthritis involving multiple sites with positive rheumatoid factor ([place]) - Plan: C-reactive protein  Joint inflammation appears well controlled on current regiment with no focal tenderness or active synovitis on exam.  Checking CRP for disease activity monitoring.  Plan to continue Orencia 125 mg subcu weekly, hydroxychloroquine 300 mg daily, methotrexate 10 mg p.o. weekly, folic acid 1 mg daily.  CREST syndrome (HCC)  No significant change in skin disease gastrointestinal symptoms remain largely improved with the pancreatic enzyme supplementation.  Still having a lot of problems with the Raynaud's with associated numbness and some pain.  Raynaud's syndrome with gangrene [facility])  Currently managing with cold avoidance and protection.  She previously did not tolerate calcium channel blockers or PDE 5 inhibitors well at all some pitting injuries occurring but no critical ischemia so no change in treatment at this time.  High risk medication use - Plan: CBC with Differential/Platelet, COMPLETE METABOLIC PANEL WITH GFR, QuantiFERON-TB Gold Plus  Checking CBC and CMP for long-term medication monitoring also rechecking QuantiFERON for continued hematest abs.  She previously had mild LFT elevations on low-dose methotrexate we will continue current treatment unless worsening trend.  She was curious about addition of milk thistle for liver protection not aware of any significant data for this nor any drug interaction with the current regimen though.   Orders: Orders Placed This Encounter  Procedures   C-reactive  protein   CBC with Differential/Platelet   COMPLETE METABOLIC PANEL WITH GFR   QuantiFERON-TB Gold Plus   No orders of the defined types were placed in this encounter.    Follow-Up Instructions: Return in about 3 months (around 05/27/2021) for RA/CREST f/u ABA/MTX/HCQ 19mos.   Collier Salina, MD  Note - This record has been created using [employer].  Chart creation errors have been sought, but may not always  have been located. Such creation errors do not reflect on  the standard of medical care.

## 2021-02-26 ENCOUNTER — Ambulatory Visit (INDEPENDENT_AMBULATORY_CARE_PROVIDER_SITE_OTHER): Payer: Managed Care, Other (non HMO) | Admitting: Internal Medicine

## 2021-02-26 ENCOUNTER — Encounter: Payer: Self-pay | Admitting: Internal Medicine

## 2021-02-26 ENCOUNTER — Other Ambulatory Visit: Payer: Self-pay

## 2021-02-26 VITALS — BP 108/71 | HR 69 | Resp 12 | Ht 64.0 in | Wt 123.2 lb

## 2021-02-26 DIAGNOSIS — Z79899 Other long term (current) drug therapy: Secondary | ICD-10-CM

## 2021-02-26 DIAGNOSIS — R5382 Chronic fatigue, unspecified: Secondary | ICD-10-CM

## 2021-02-26 DIAGNOSIS — M341 CR(E)ST syndrome: Secondary | ICD-10-CM | POA: Diagnosis not present

## 2021-02-26 DIAGNOSIS — M0579 Rheumatoid arthritis with rheumatoid factor of multiple sites without organ or systems involvement: Secondary | ICD-10-CM | POA: Diagnosis not present

## 2021-02-26 DIAGNOSIS — I7301 Raynaud's syndrome with gangrene: Secondary | ICD-10-CM | POA: Diagnosis not present

## 2021-03-01 LAB — QUANTIFERON-TB GOLD PLUS
Mitogen-NIL: >10 IU/mL
NIL: 0.04 IU/mL
QuantiFERON-TB Gold Plus: NEGATIVE
TB1-NIL: 0 IU/mL
TB2-NIL: 0.01 IU/mL

## 2021-03-01 LAB — CBC WITH DIFFERENTIAL/PLATELET
Absolute Monocytes: 521 cells/uL (ref 200–950)
Basophils Absolute: 50 cells/uL (ref 0–200)
Basophils Relative: 0.6 %
Eosinophils Absolute: 176 cells/uL (ref 15–500)
Eosinophils Relative: 2.1 %
HCT: 39.1 % (ref 35.0–45.0)
Hemoglobin: 13.1 g/dL (ref 11.7–15.5)
Lymphs Abs: 3301 cells/uL (ref 850–3900)
MCH: 31.6 pg (ref 27.0–33.0)
MCHC: 33.5 g/dL (ref 32.0–36.0)
MCV: 94.4 fL (ref 80.0–100.0)
MPV: 10.6 fL (ref 7.5–12.5)
Monocytes Relative: 6.2 %
Neutro Abs: 4351 cells/uL (ref 1500–7800)
Neutrophils Relative %: 51.8 %
Platelets: 253 10*3/uL (ref 140–400)
RBC: 4.14 10*6/uL (ref 3.80–5.10)
RDW: 11.6 % (ref 11.0–15.0)
Total Lymphocyte: 39.3 %
WBC: 8.4 10*3/uL (ref 3.8–10.8)

## 2021-03-01 LAB — COMPLETE METABOLIC PANEL WITH GFR
AG Ratio: 1.9 (calc) (ref 1.0–2.5)
ALT: 30 U/L — ABNORMAL HIGH (ref 6–29)
AST: 26 U/L (ref 10–35)
Albumin: 4.3 g/dL (ref 3.6–5.1)
Alkaline phosphatase (APISO): 48 U/L (ref 31–125)
BUN: 14 mg/dL (ref 7–25)
CO2: 25 mmol/L (ref 20–32)
Calcium: 9.3 mg/dL (ref 8.6–10.2)
Chloride: 103 mmol/L (ref 98–110)
Creat: 0.68 mg/dL (ref 0.50–0.99)
Globulin: 2.3 g/dL (calc) (ref 1.9–3.7)
Glucose, Bld: 88 mg/dL (ref 65–99)
Potassium: 4.4 mmol/L (ref 3.5–5.3)
Sodium: 138 mmol/L (ref 135–146)
Total Bilirubin: 0.5 mg/dL (ref 0.2–1.2)
Total Protein: 6.6 g/dL (ref 6.1–8.1)
eGFR: 107 mL/min/{1.73_m2} (ref >=60)

## 2021-03-01 LAB — C-REACTIVE PROTEIN: CRP: 3.3 mg/L (ref <8.0)

## 2021-03-06 ENCOUNTER — Ambulatory Visit: Payer: Managed Care, Other (non HMO) | Admitting: Internal Medicine

## 2021-03-06 NOTE — Progress Notes (Signed)
Lab results look okay ALT is 30 which continues improving to normal so okay to stay on the current low dose methotrexate.

## 2021-03-08 ENCOUNTER — Ambulatory Visit: Payer: Managed Care, Other (non HMO) | Admitting: Family Medicine

## 2021-03-08 ENCOUNTER — Encounter: Payer: Self-pay | Admitting: Family Medicine

## 2021-03-08 ENCOUNTER — Other Ambulatory Visit: Payer: Self-pay

## 2021-03-08 VITALS — BP 113/59 | HR 84 | Temp 98.2°F | Resp 16 | Ht 64.0 in | Wt 122.0 lb

## 2021-03-08 DIAGNOSIS — M5442 Lumbago with sciatica, left side: Secondary | ICD-10-CM

## 2021-03-08 DIAGNOSIS — F4329 Adjustment disorder with other symptoms: Secondary | ICD-10-CM | POA: Diagnosis not present

## 2021-03-08 MED ORDER — CYCLOBENZAPRINE HCL 10 MG PO TABS
5.0000 mg | ORAL_TABLET | Freq: Every evening | ORAL | 0 refills | Status: DC | PRN
Start: 2021-03-08 — End: 2021-03-30

## 2021-03-08 NOTE — Progress Notes (Signed)
Acute Office Visit  Subjective:    Patient ID: Dawn Griffin, female    DOB: May 01, 1970, 50 y.o.   MRN: [id number]  Chief Complaint  Patient presents with   Back Pain    Sharp pain mainly left buttock that radiates down left leg for 6 days. No known injury     HPI Patient is in today for acute back pain. Sharp pain mainly left buttock that radiates down left leg for 6 days. No known injury. Prior history of herniated disc L5-S1) in her low back, that ended up requiring surgical treatment about 20 years ago.  She says this pain actually does feel similar.  The pain radiates from the low back buttock area all the way down to about her calf.  She does not remember any specific injury or trauma heavy lifting etc.  But it was around the holidays so she cannot rule that completely out but she does not remember anything specific causing an injury.  He is not currently taking any medications for her pain or discomfort.  She had just 1 night where she took an anti-inflammatory.  So wanted to let me know that she was recently evaluated by Dr. Hilarie Fredrickson for her chronic diarrhea and says she finally has an answer for what is been causing it.  She has pancreatic insufficiency.  She is now taking enzymes with meals and said it is made a tremendous difference.  She is also had some mild elevation in her liver enzymes so they have been trying to adjust her methotrexate for that.  She follows with Dr. Benjamine Mola for rheumatology now.  And also follows with Dr. Aileen Pilot so at [facility] for her scleroderma.  Past Medical History:  Diagnosis Date   Aneurysm ([place]) 02/2020   Aortic atherosclerosis (HCC)    CREST (calcinosis, Raynaud's phenomenon, esophageal dysfunction, sclerodactyly, telangiectasia) ([place]) 2022   Emphysema of lung (HCC)    GERD (gastroesophageal reflux disease)    Irritable bowel syndrome with diarrhea    Pancreatic divisum    Raynaud disease    Rheumatoid arthritis ([place])    Sessile colonic polyp      Past Surgical History:  Procedure Laterality Date   ANEURYSM COILING  02/2020   FINGER ARTHROSCOPY WITH CARPOMETACARPEL ([place]) ARTHROPLASTY Left 03/2019   LUMBAR SPINE SURGERY  2001   L4-5-S1   TONSILLECTOMY  1990   VAGUS NERVE STIMULATOR INSERTION  09/2019    Family History  Problem Relation Age of Onset   Bipolar disorder Mother    Lung cancer Mother    Lung cancer Father    Gout Father    Healthy Sister    Lupus Sister    Healthy Brother    Arthritis/Rheumatoid Maternal Grandmother    Stomach cancer Paternal Grandmother    Colon cancer Neg Hx    Esophageal cancer Neg Hx    Colon polyps Neg Hx    Rectal cancer Neg Hx     Social History   Socioeconomic History   Marital status: Married    Spouse name: Not on file   Number of children: Not on file   Years of education: Not on file   Highest education level: Not on file  Occupational History   Not on file  Tobacco Use   Smoking status: Former    Packs/day: 0.30    Types: Cigarettes    Quit date: 10/30/2020    Years since quitting: 0.3   Smokeless tobacco: Never  Vaping Use  Vaping Use: Never used  Substance and Sexual Activity   Alcohol use: Yes    Alcohol/week: 4.0 standard drinks    Types: 4 Standard drinks or equivalent per week    Comment: Socail   Drug use: No   Sexual activity: Yes    Partners: Male  Other Topics Concern   Not on file  Social History Narrative   No exercise. + daily caffeine.    Social Determinants of Health   Financial Resource Strain: Not on file  Food Insecurity: Not on file  Transportation Needs: Not on file  Physical Activity: Not on file  Stress: Not on file  Social Connections: Not on file  Intimate Partner Violence: Not on file    Outpatient Medications Prior to Visit  Medication Sig Dispense Refill   famotidine (PEPCID) 20 MG tablet Take 20 mg by mouth 2 (two) times daily.     folic acid (FOLVITE) 1 MG tablet Take 1 tablet (1 mg total) by mouth daily. 90  tablet 0   hydroxychloroquine (PLAQUENIL) 200 MG tablet Take 1 tablet (200 mg total) by mouth daily. 90 tablet 0   lipase/protease/amylase (CREON) 36000 UNITS CPEP capsule Take 2 caps with meals and 1 with a fat containing snack. 900 capsule 2   methotrexate (RHEUMATREX) 2.5 MG tablet Take 4 tablets (10 mg total) by mouth once a week. Caution:Chemotherapy. Protect from light. 48 tablet 0   Multiple Vitamin (MULTIVITAMIN) tablet Take 1 tablet by mouth daily.     Omega-3 Fatty Acids (FISH OIL) 1000 MG CAPS Take 1 capsule by mouth daily.     ORENCIA CLICKJECT 932 MG/ML SOAJ INJECT 125 MG UNDER THE SKIN ONCE A WEEK 4 mL 2   Cats Claw, Uncaria tomentosa, (CATS CLAW PO) Take by mouth. Hypocotyis extract 1500 mg and cats claw 300 mg (Patient not taking: Reported on 02/26/2021)     No facility-administered medications prior to visit.    No Known Allergies  Review of Systems     Objective:    Physical Exam Vitals reviewed.  Constitutional:      Appearance: She is well-developed.  HENT:     Head: Normocephalic and atraumatic.  Eyes:     Conjunctiva/sclera: Conjunctivae normal.  Cardiovascular:     Rate and Rhythm: Normal rate.  Pulmonary:     Effort: Pulmonary effort is normal.  Musculoskeletal:     Comments: Normal lumbar flexion though she did have pain with full flexion and with coming back up.  Unable to extend.  Fairly symmetric rotation right and left that she had pain with rotation to the right.  Normal side bending.  Tender over the lumbar spine.  She is tender over the left SI joint.  Negative straight leg leg raise.  Hip, knee, ankle strength is 5-5 bilaterally.  Patellar reflexes are 2+ and symmetric.  Skin:    General: Skin is dry.     Coloration: Skin is not pale.  Neurological:     Mental Status: She is alert and oriented to person, place, and time.  Psychiatric:        Behavior: Behavior normal.    BP (!) 113/59    Pulse 84    Temp 98.2 F (36.8 C)    Resp 16    Ht 5'  4" (1.626 m)    Wt 122 lb (55.3 kg)    SpO2 98%    BMI 20.94 kg/m  Wt Readings from Last 3 Encounters:  03/08/21 122 lb (55.3 kg)  02/26/21 123 lb 3.2 oz (55.9 kg)  02/14/21 121 lb (54.9 kg)    Health Maintenance Due  Topic Date Due   HIV Screening  Never done    There are no preventive care reminders to display for this patient.   Lab Results  Component Value Date   TSH 0.89 06/25/2018   Lab Results  Component Value Date   WBC 8.4 02/26/2021   HGB 13.1 02/26/2021   HCT 39.1 02/26/2021   MCV 94.4 02/26/2021   PLT 253 02/26/2021   Lab Results  Component Value Date   NA 138 02/26/2021   K 4.4 02/26/2021   CO2 25 02/26/2021   GLUCOSE 88 02/26/2021   BUN 14 02/26/2021   CREATININE 0.68 02/26/2021   BILITOT 0.5 02/26/2021   ALKPHOS 38 03/18/2016   AST 26 02/26/2021   ALT 30 (H) 02/26/2021   PROT 6.6 02/26/2021   ALBUMIN 4.0 03/18/2016   CALCIUM 9.3 02/26/2021   EGFR 107 02/26/2021   Lab Results  Component Value Date   CHOL 177 05/27/2019   Lab Results  Component Value Date   HDL 59 05/27/2019   Lab Results  Component Value Date   LDLCALC 91 05/27/2019   Lab Results  Component Value Date   TRIG 171 (H) 05/27/2019   Lab Results  Component Value Date   CHOLHDL 3.0 05/27/2019   No results found for: HGBA1C     Assessment & Plan:   Problem List Items Addressed This Visit   None Visit Diagnoses     Acute left-sided low back pain with left-sided sciatica    -  Primary   Relevant Medications   cyclobenzaprine (FLEXERIL) 10 MG tablet   Other Relevant Orders   Ambulatory referral to Physical Therapy   Stress and adjustment reaction       Relevant Orders   Ambulatory referral to [place]      Acute left-sided low back pain with sciatica/radiculopathy-we will start initially with prednisone 40 mg daily for 5 days.  She says she has enough at home to do that on her own without a new prescription.  Also will do a trial of Flexeril at bedtime  to see if that helps.  If she does not like how it makes her feel then we will stop that.  Handout given for gentle stretches to do on her own at home.  Also reviewed a couple of specific exercises for that unlike really like her to try.  If not improving then consider formal PT and possible imaging.  She has given it some thought and would like referral to therapist/counselor to help her deal with chronic medical problems.  Meds ordered this encounter  Medications   cyclobenzaprine (FLEXERIL) 10 MG tablet    Sig: Take 0.5-1 tablets (5-10 mg total) by mouth at bedtime as needed for muscle spasms.    Dispense:  20 tablet    Refill:  0     Beatrice Lecher, MD

## 2021-03-09 ENCOUNTER — Ambulatory Visit (INDEPENDENT_AMBULATORY_CARE_PROVIDER_SITE_OTHER): Payer: Managed Care, Other (non HMO) | Admitting: Physical Therapy

## 2021-03-09 DIAGNOSIS — M6281 Muscle weakness (generalized): Secondary | ICD-10-CM | POA: Diagnosis not present

## 2021-03-09 DIAGNOSIS — R293 Abnormal posture: Secondary | ICD-10-CM

## 2021-03-09 DIAGNOSIS — M5442 Lumbago with sciatica, left side: Secondary | ICD-10-CM | POA: Diagnosis not present

## 2021-03-09 DIAGNOSIS — R2689 Other abnormalities of gait and mobility: Secondary | ICD-10-CM

## 2021-03-09 DIAGNOSIS — M533 Sacrococcygeal disorders, not elsewhere classified: Secondary | ICD-10-CM | POA: Diagnosis not present

## 2021-03-09 NOTE — Therapy (Signed)
[place], [place], [address] Phone: [phone]   Fax:  [phone]  Physical Therapy Evaluation  Patient Details  Name: Dawn Griffin MRN: [id number] Date of Birth: Sep 05, 1970 Referring Provider (PT): Hali Marry, MD   Encounter Date: 03/09/2021   PT End of Session - 03/09/21 1526     Visit Number 1    Number of Visits 6    Date for PT Re-Evaluation 04/20/21    Authorization Type [employer]    PT Start Time 1407    PT Stop Time 1500    PT Time Calculation (min) 53 min    Activity Tolerance Patient tolerated treatment well    Behavior During Therapy [facility] for tasks assessed/performed             Past Medical History:  Diagnosis Date   Aneurysm ([place]) 02/2020   Aortic atherosclerosis (HCC)    CREST (calcinosis, Raynaud's phenomenon, esophageal dysfunction, sclerodactyly, 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    Subjective Assessment - 03/09/21 1409     Subjective Pt notes ~1 month ago she was getting twinges of pain in L leg. Pt notes she had a bad disc herniation 21 years ago and had surgery; at the time she would get just leg pain not actual back pain. Leg hasn't hurt since then but she could feel some tightness in her back on and off but nothing too bad. Last Saturday, she wasn't able to move. Started as a stabbing nerve pain from L back. She reports L LE numbness. Pt took  prednisone and it helped some with the pain. Flexeril has helped for pt to sleep. Pt reports the pain feels a little random -- she can be good for 5 minutes at a time or walk and then she will feel it. Pt has started some stretches provided by Dr. Madilyn Fireman. She notes that normally she is able to bend over and get her hands to her feet but now she is having increased shooting pain doing this    Pertinent History RA (with knee and hip pain), scleroderma, back surgery for herniation    Limitations Walking;Sitting;House hold activities    How long can you sit comfortably? Depends    How long can you stand comfortably? Depends    How long can you walk comfortably? Depends    Patient Stated Goals Improve pain    Currently in Pain? Yes    Pain Score 6     Pain Location Back    Pain Orientation Left    Pain Descriptors / Indicators Aching;Shooting;Sharp   "nervy"   Pain Type Acute pain    Pain Onset In the past 7 days    Pain Frequency Intermittent    Aggravating Factors  Unsure but appears that pulling, twisting and walking for a while    Pain Relieving Factors Flexeril    Effect of  Pain on Daily Activities Walking, standing, work and home tasks                [facility] PT Assessment - 03/09/21 0001       Assessment   Medical Diagnosis M54.42 (ICD-10-CM) - Acute left-sided low back pain with left-sided sciatica    Referring Provider (PT) Hali Marry, MD    Onset Date/Surgical Date 03/03/21    Hand Dominance Right    Prior Therapy Yes, after prior back surgery      Balance Screen   Has the patient fallen in the past 6 months No      [place] residence    Living Arrangements Spouse/significant other;Children      Prior Function   Level of Independence Independent    Vocation Full time employment    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      Observation/Other Assessments   Focus on Therapeutic Outcomes ([place])  Will assess when  able      ROM / Strength   AROM / PROM / Strength AROM;Strength      AROM   AROM Assessment Site Lumbar    Lumbar Flexion 6" from ground   increased pain   Lumbar Extension 25%   increased pian   Lumbar - Right Side Bend 90%   can feel pull   Lumbar - Left Side Bend 100%    Lumbar - Right Rotation 100%   can feel it   Lumbar - Left Rotation 100%      Strength   Strength Assessment Site Hip    Right/Left Hip Right;Left    Right Hip Flexion 4+/5    Right Hip Extension 3/5    Right Hip ABduction 4+/5    Right Hip ADduction 3+/5    Left Hip Flexion 4-/5    Left Hip Extension 3-/5    Left Hip External Rotation --   Limited range   Left Hip ABduction 3-/5   limited due to pain; in clam position 4/5   Left Hip ADduction 3-/5      Palpation   SI assessment  Initially: L anterior inominate rotation noted from sup to long sit; After correction: L posterior inominate rotation sup to sit; by end of session after recorrection: appeared in line    Palpation comment TTP right on SIJ; possible L sacral outlare      Special Tests    Special Tests Lumbar    Lumbar Tests Slump Test;Prone Knee Bend Test;Straight Leg Raise      Slump test   Findings Positive    Side Left    Comment Unable to fully straighten knee      Prone Knee Bend Test   Findings Positive    Side Left      Straight Leg Raise   Findings Positive    Side  Left    Comment Within 45 deg of L SLR                        Objective measurements completed on examination: See above findings.       [facility] Adult PT Treatment/Exercise - 03/09/21 0001       Exercises   Exercises Lumbar      Lumbar Exercises: Stretches   Single Knee to Chest Stretch 1 rep;60 seconds    Single Knee to Chest Stretch Limitations into slight abd for correction of ant inominate rotation  Other Lumbar Stretch Exercise modified thomas stretch x30 sec to correct posterior inominate rotation      Lumbar Exercises: Supine    Bridge Limitations Attempted but too painful    Other Supine Lumbar Exercises Isometric clamshell 3x10"    Other Supine Lumbar Exercises Isometric hip adduction 3x10"                     PT Education - 03/09/21 1525     Education Details Discussed exam findings, POC, and initial HEP to try and address.    Person(s) Educated Patient    Methods Explanation;Demonstration;Tactile cues;Verbal cues;Handout    Comprehension Verbalized understanding;Returned demonstration;Verbal cues required;Tactile cues required;Need further instruction                 PT Long Term Goals - 03/09/21 1545       PT LONG TERM GOAL #1   Title Pt will be independent with HEP    Time 6    Period Weeks    Status New    Target Date 04/20/21      PT LONG TERM GOAL #2   Title Pt will be able to self correct SI alignment    Time 6    Period Weeks    Status New    Target Date 04/20/21      PT LONG TERM GOAL #3   Title Pt will demo at least 4/5 hip extensor and abductor strength bilat    Time 6    Period Weeks    Status New    Target Date 04/20/21      PT LONG TERM GOAL #4   Title Pt will report decrease in pain by at least 50% with all mobility    Time 6    Period Weeks    Status New    Target Date 04/20/21                    Plan - 03/09/21 1527     Clinical Impression Statement Ms. Dawn Griffin is a 50 y/o F presenting to OPPT due to acute L sided low back pain with radicular symptoms. Assessment significant for SIJ malalignment, limited hip extension, and L>R hip weakness. Upon palpation, pt's pain centralized on upper L SIJ with more prominant L sacral border vs R due to possible L sacral torsion.  On initial assessment, pt appeared to have anteriorly rotated L inominate; performed correction with improved symptoms initially before pain felt "lower" closer towards posterior hip joint and glutes. Trialed glute stretching; however, due to pt hypermobility was not able to  obtain a good stretch. Rechecked leg lengths and found pt had rotated posteriorly on L. Performed corrections with improved symptoms once more. Able to walk out with less pain. Pt would benefit from PT to improve alignment and decrease pain with mobility. Due to high copay pt is requesting to perform weekly PT sessions initially and taper to every other week if able.    Personal Factors and Comorbidities Fitness;Time since onset of injury/illness/exacerbation;Past/Current Experience;Finances    Examination-Activity Limitations Locomotion Level;Transfers;Bed Mobility;Sit;Sleep;Lift;Hygiene/Grooming;Bend;Squat;Stand;Stairs    Examination-Participation Restrictions Cleaning;Community Activity;Occupation;Shop;Driving    Stability/Clinical Decision Making Evolving/Moderate complexity    Clinical Decision Making Moderate    Rehab Potential Good    PT Frequency 1x / week    PT Duration 6 weeks    PT Treatment/Interventions ADLs/Self Care Home Management;Cryotherapy;Electrical Stimulation;Iontophoresis 4mg /ml Dexamethasone;Moist Heat;Gait training;Stair training;Functional mobility training;Therapeutic activities;Therapeutic exercise;Balance training;Neuromuscular re-education;Manual techniques;Patient/family education;Dry needling;Passive  range of motion;Taping    PT Next Visit Plan Assess response to HEP. Check SIJ alignment. Check for sacral torsion. Address accordingly. Stabilize pelvis/hip/core.    PT Home Exercise Plan Access Code: [email or web address]    Consulted and Agree with Plan of Care Patient             Patient will benefit from skilled therapeutic intervention in order to improve the following deficits and impairments:  Decreased range of motion, Difficulty walking, Pain, Hypermobility, Decreased mobility, Postural dysfunction, Decreased strength, Improper body mechanics, Decreased activity tolerance  Visit Diagnosis: Abnormal posture  Sacrococcygeal disorders, not elsewhere classified  Acute  left-sided low back pain with left-sided sciatica  Muscle weakness (generalized)  Other abnormalities of gait and mobility     Problem List Patient Active Problem List   Diagnosis Date Noted   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   Rheumatoid arthritis involving both hands with positive rheumatoid factor ([place]) 12/12/2020   Pancreatic insufficiency 10/10/2020   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   GERD (gastroesophageal reflux disease) 07/06/2020   High risk medication use 05/25/2020   Vitamin D deficiency 05/25/2020   S/P coil embolization of cerebral aneurysm 02/28/2020   Centrilobular emphysema ([place]) 08/04/2019   Chronic fatigue 05/10/2019   Brain fog 05/10/2019   Myofascial pain 06/30/2018   Rheumatoid arthritis ([place]) 05/20/2017   Bilateral hand numbness 08/17/2013   Arthritis of left carpometacarpal joint 02/26/2013   SOB (shortness of breath) 02/26/2013   ANEMIA, HX OF 01/10/2010   URINARY URGENCY 12/21/2009   LYMPHADENOPATHY 08/15/2008     April Ma L , PT, DPT 03/09/2021, 3:48 PM  [facility] [place], [place], [address] Phone: [phone]   Fax:  [phone]  Name: Dawn Griffin MRN: [id number] Date of Birth: Sep 10, 1970

## 2021-03-15 ENCOUNTER — Other Ambulatory Visit: Payer: Self-pay

## 2021-03-15 ENCOUNTER — Encounter: Payer: Self-pay | Admitting: Rehabilitative and Restorative Service Providers"

## 2021-03-15 ENCOUNTER — Ambulatory Visit
Payer: Managed Care, Other (non HMO) | Attending: Family Medicine | Admitting: Rehabilitative and Restorative Service Providers"

## 2021-03-15 DIAGNOSIS — R293 Abnormal posture: Secondary | ICD-10-CM

## 2021-03-15 DIAGNOSIS — M5442 Lumbago with sciatica, left side: Secondary | ICD-10-CM | POA: Insufficient documentation

## 2021-03-15 DIAGNOSIS — M6281 Muscle weakness (generalized): Secondary | ICD-10-CM

## 2021-03-15 DIAGNOSIS — R2689 Other abnormalities of gait and mobility: Secondary | ICD-10-CM

## 2021-03-15 DIAGNOSIS — M533 Sacrococcygeal disorders, not elsewhere classified: Secondary | ICD-10-CM

## 2021-03-15 NOTE — Therapy (Signed)
[place], [place], [address] Phone: [phone]   Fax:  [phone]  Physical Therapy Treatment  Patient Details  Name: Dawn Griffin MRN: [id number] Date of Birth: 05-29-1970 Referring Provider (PT): Hali Marry, MD   Encounter Date: 03/15/2021   PT End of Session - 03/15/21 1103     Visit Number 2    Number of Visits 6    Date for PT Re-Evaluation 04/20/21    Authorization Type Cigna    PT Start Time 1100    PT Stop Time 1148    PT Time Calculation (min) 48 min    Activity Tolerance Patient tolerated treatment well             Past Medical History:  Diagnosis Date   Aneurysm ([place]) 02/2020   Aortic atherosclerosis (HCC)    CREST (calcinosis, Raynaud's phenomenon, esophageal dysfunction, sclerodactyly, 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   Subjective Assessment - 03/15/21 1106     Subjective Patient reports that pain has persisted and increased since last week. Patient reports that she finished with prednisone Friday. Can't find any position to get comfortable. Sitting is the best. She has numbness and burning in the lt posterior calf which is increased with walking. Feels like she did when she had the herniated disc 21 years ago. Patient reports ~ 50% improvement in pain with treatment but Lt LE pain returned as she stood and walked.    Currently in Pain? Yes    Pain  Score 7     Pain Location Back    Pain Orientation Left    Pain Descriptors / Indicators Aching;Shooting;Sharp    Pain Type Acute pain    Pain Onset 1 to 4 weeks ago    Pain Frequency Intermittent                [facility] PT Assessment - 03/15/21 0001       Assessment   Medical Diagnosis M54.42 (ICD-10-CM) - Acute left-sided low back pain with left-sided sciatica    Referring Provider (PT) Hali Marry, MD    Onset Date/Surgical Date 03/03/21    Hand Dominance Right    Prior Therapy Yes, after prior back surgery      Palpation   Palpation comment muscular tightness Lt QL, lumbar paraspinals; gluts; piriformis                           [facility] Adult PT Treatment/Exercise - 03/15/21 0001       Self-Care   ADL's initated back care education; positions for rest and positions to avoid with discogenic pain      Therapeutic Activites    Other Therapeutic Activities instruction in deep breathing exercises      Lumbar Exercises: Supine   AB Set Limitations 3 part core 10 sec hold  Moist Heat Therapy   Number Minutes Moist Heat 15 Minutes    Moist Heat Location Lumbar Spine      Electrical Stimulation   Electrical Stimulation Location Lt lumbar to posterior hip    Electrical Stimulation Action TENS    Electrical Stimulation Parameters to tolerance    Electrical Stimulation Goals Pain;Tone      Manual Therapy   Manual therapy comments skilled palpation to assess response to DN and manual work    Soft tissue mobilization deep tissue work through the [employer] area into the Lt posterior hip    Myofascial Release Lt posterior hip    Passive ROM IR/ER Lt hip pt prone              Trigger Point Dry Needling - 03/15/21 0001     Consent Given? Yes    Education Handout Provided Yes    Other Dry Needling Lt    Gluteus Medius Response Palpable increased muscle length    Gluteus Maximus Response Palpable increased muscle length    Piriformis  Response Palpable increased muscle length    Lumbar multifidi Response Palpable increased muscle length    Quadratus Lumborum Response Palpable increased muscle length                   PT Education - 03/15/21 1138     Education Details DN, TENs, posture and body mechanics, HEP    Person(s) Educated Patient    Methods Explanation;Demonstration;Tactile cues;Verbal cues;Handout    Comprehension Verbalized understanding;Returned demonstration;Verbal cues required;Tactile cues required                 PT Long Term Goals - 03/09/21 1545       PT LONG TERM GOAL #1   Title Pt will be independent with HEP    Time 6    Period Weeks    Status New    Target Date 04/20/21      PT LONG TERM GOAL #2   Title Pt will be able to self correct SI alignment    Time 6    Period Weeks    Status New    Target Date 04/20/21      PT LONG TERM GOAL #3   Title Pt will demo at least 4/5 hip extensor and abductor strength bilat    Time 6    Period Weeks    Status New    Target Date 04/20/21      PT LONG TERM GOAL #4   Title Pt will report decrease in pain by at least 50% with all mobility    Time 6    Period Weeks    Status New    Target Date 04/20/21                   Plan - 03/15/21 1104     Clinical Impression Statement Patient returns reporting no improvement in symptoms and in fact she has experienced some increase in the Lt LE pain in the past week. Patient has increased pain with standing and walking. She has significant muscular tightness in the Lt QL and lumbar paraspinals as well as posterior hip. Symptoms are discogenic in nature. Good response to DN and manual work but pain increased as patient stood for a few minutes. Discussed positions she should avoid and activities she can try. She may benefit from use of TENS for pain management. If patient has lasting imrovement from treatment today she may want to  increase to 2x/wk. She will decide by the next  appointment.    Rehab Potential Good    PT Frequency 1x / week    PT Duration 6 weeks    PT Treatment/Interventions ADLs/Self Care Home Management;Cryotherapy;Electrical Stimulation;Iontophoresis 4mg /ml Dexamethasone;Moist Heat;Gait training;Stair training;Functional mobility training;Therapeutic activities;Therapeutic exercise;Balance training;Neuromuscular re-education;Manual techniques;Patient/family education;Dry needling;Passive range of motion;Taping    PT Next Visit Plan assess response to DN and manual work; assess response to HEP; Check SIJ alignment. Check for sacral torsion. Address accordingly. Stabilize pelvis/hip/core. Further assessment and treatment as indicated.    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   Rheumatoid arthritis involving both hands with positive rheumatoid factor ([place]) 12/12/2020   Pancreatic insufficiency 10/10/2020   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   GERD (gastroesophageal reflux disease) 07/06/2020   High risk medication use 05/25/2020   Vitamin D deficiency 05/25/2020   S/P coil embolization of cerebral aneurysm 02/28/2020   Centrilobular emphysema ([place]) 08/04/2019   Chronic fatigue 05/10/2019   Brain fog 05/10/2019   Myofascial pain 06/30/2018   Rheumatoid arthritis ([place]) 05/20/2017   Bilateral hand numbness 08/17/2013   Arthritis of left carpometacarpal joint 02/26/2013   SOB (shortness of breath) 02/26/2013   ANEMIA, HX OF 01/10/2010    URINARY URGENCY 12/21/2009   LYMPHADENOPATHY 08/15/2008     Nilda Simmer, PT, MPH  03/15/2021, 1:23 PM  [facility] [place], [place], [address] Phone: [phone]   Fax:  [phone]  Name: Dawn Griffin MRN: [id number] Date of Birth: [birth year]

## 2021-03-15 NOTE — Patient Instructions (Addendum)
Trigger Point Dry Needling  What is Trigger Point Dry Needling (DN)? DN is a physical therapy technique used to treat muscle pain and dysfunction. Specifically, DN helps deactivate muscle trigger points (muscle knots).  A thin filiform needle is used to penetrate the skin and stimulate the underlying trigger point. The goal is for a local twitch response (LTR) to occur and for the trigger point to relax. No medication of any kind is injected during the procedure.   What Does Trigger Point Dry Needling Feel Like?  The procedure feels different for each individual patient. Some patients report that they do not actually feel the needle enter the skin and overall the process is not painful. Very mild bleeding may occur. However, many patients feel a deep cramping in the muscle in which the needle was inserted. This is the local twitch response.   How Will I feel after the treatment? Soreness is normal, and the onset of soreness may not occur for a few hours. Typically this soreness does not last longer than two days.  Bruising is uncommon, however; ice can be used to decrease any possible bruising.  In rare cases feeling tired or nauseous after the treatment is normal. In addition, your symptoms may get worse before they get better, this period will typically not last longer than 24 hours.   What Can I do After My Treatment? Increase your hydration by drinking more water for the next 24 hours. You may place ice or heat on the areas treated that have become sore, however, do not use heat on inflamed or bruised areas. Heat often brings more relief post needling. You can continue your regular activities, but vigorous activity is not recommended initially after the treatment for 24 hours. DN is best combined with other physical therapy such as strengthening, stretching, and other therapies.    TENS UNIT: This is helpful for muscle pain and spasm.   Search and Purchase a TENS 7000 2nd edition at  www.tenspros.com. It should be less than $30.     TENS unit instructions: Do not shower or bathe with the unit on Turn the unit off before removing electrodes or batteries If the electrodes lose stickiness add a drop of water to the electrodes after they are disconnected from the unit and place on plastic sheet. If you continued to have difficulty, call the TENS unit company to purchase more electrodes. Do not apply lotion on the skin area prior to use. Make sure the skin is clean and dry as this will help prolong the life of the electrodes. After use, always check skin for unusual red areas, rash or other skin difficulties. If there are any skin problems, does not apply electrodes to the same area. Never remove the electrodes from the unit by pulling the wires. Do not use the TENS unit or electrodes other than as directed. Do not change electrode placement without consultating your therapist or physician. Keep 2 fingers with between each electrode.    Sleeping on Back  Place pillow under knees. A pillow with cervical support and a roll around waist are also helpful. Copyright  VHI. All rights reserved.  Sleeping on Side Place pillow between knees. Use cervical support under neck and a roll around waist as needed. Copyright  VHI. All rights reserved.   Sleeping on Stomach   If this is the only desirable sleeping position, place pillow under lower legs, and under stomach or chest as needed.  Posture - Sitting   Sit upright,  head facing forward. Try using a roll to support lower back. Keep shoulders relaxed, and avoid rounded back. Keep hips level with knees. Avoid crossing legs for long periods. Stand to Sit / Sit to Stand   To sit: Bend knees to lower self onto front edge of chair, then scoot back on seat. To stand: Reverse sequence by placing one foot forward, and scoot to front of seat. Use rocking motion to stand up.   Work Height and Reach  Ideal work height is no more  than 2 to 4 inches below elbow level when standing, and at elbow level when sitting. Reaching should be limited to arm's length, with elbows slightly bent.  Bending  Bend at hips and knees, not back. Keep feet shoulder-width apart.    Posture - Standing   Good posture is important. Avoid slouching and forward head thrust. Maintain curve in low back and align ears over shoul- ders, hips over ankles.  Alternating Positions   Alternate tasks and change positions frequently to reduce fatigue and muscle tension. Take rest breaks. Computer Work   Position work to Programmer, multimedia. Use proper work and seat height. Keep shoulders back and down, wrists straight, and elbows at right angles. Use chair that provides full back support. Add footrest and lumbar roll as needed.  Getting Into / Out of Car  Lower self onto seat, scoot back, then bring in one leg at a time. Reverse sequence to get out.  Dressing  Lie on back to pull socks or slacks over feet, or sit and bend leg while keeping back straight.    Housework - Sink  Place one foot on ledge of cabinet under sink when standing at sink for prolonged periods.   Pushing / Pulling  Pushing is preferable to pulling. Keep back in proper alignment, and use leg muscles to do the work.  Deep Squat   Squat and lift with both arms held against upper trunk. Tighten stomach muscles without holding breath. Use smooth movements to avoid jerking.  Avoid Twisting   Avoid twisting or bending back. Pivot around using foot movements, and bend at knees if needed when reaching for articles.  Carrying Luggage   Distribute weight evenly on both sides. Use a cart whenever possible. Do not twist trunk. Move body as a unit.   Lifting Principles Maintain proper posture and head alignment. Slide object as close as possible before lifting. Move obstacles out of the way. Test before lifting; ask for help if too heavy. Tighten stomach muscles without  holding breath. Use smooth movements; do not jerk. Use legs to do the work, and pivot with feet. Distribute the work load symmetrically and close to the center of trunk. Push instead of pull whenever possible.   Ask For Help   Ask for help and delegate to others when possible. Coordinate your movements when lifting together, and maintain the low back curve.  Log Roll   Lying on back, bend left knee and place left arm across chest. Roll all in one movement to the right. Reverse to roll to the left. Always move as one unit. Housework - Sweeping  Use long-handled equipment to avoid stooping.   Housework - Wiping  Position yourself as close as possible to reach work surface. Avoid straining your back.  Laundry - Unloading Wash   To unload small items at bottom of washer, lift leg opposite to arm being used to reach.  [place] close to area to be  raked. Use arm movements to do the work. Keep back straight and avoid twisting.     Cart  When reaching into cart with one arm, lift opposite leg to keep back straight.   Getting Into / Out of Bed  Lower self to lie down on one side by raising legs and lowering head at the same time. Use arms to assist moving without twisting. Bend both knees to roll onto back if desired. To sit up, start from lying on side, and use same move-ments in reverse. Housework - Vacuuming  Hold the vacuum with arm held at side. Step back and forth to move it, keeping head up. Avoid twisting.   Laundry - IT consultant so that bending and twisting can be avoided.   Laundry - Unloading Dryer  Squat down to reach into clothes dryer or use a reacher.  Gardening - Weeding / Probation officer or Kneel. Knee pads may be helpful.                      Access Code: [email or web address] URL: [email or web address] Date: 03/15/2021 Prepared by: Gillermo Murdoch  Exercises Hooklying Isometric Clamshell -  1 x daily - 7 x weekly - 3 sets - 10 reps Supine Hip Adduction Isometric with Ball - 1 x daily - 7 x weekly - 3 sets - 10 reps Supine Single Knee to Chest Stretch - 1 x daily - 7 x weekly - 1 sets - 30 sec hold Modified Thomas Stretch - 1 x daily - 7 x weekly - 1 sets - 30 sec hold Supine Transversus Abdominis Bracing with Pelvic Floor Contraction - 2 x daily - 7 x weekly - 1 sets - 10 reps - 10sec hold

## 2021-03-19 ENCOUNTER — Ambulatory Visit: Payer: Managed Care, Other (non HMO) | Admitting: Family Medicine

## 2021-03-19 ENCOUNTER — Other Ambulatory Visit: Payer: Self-pay

## 2021-03-19 ENCOUNTER — Encounter: Payer: Self-pay | Admitting: Rehabilitative and Restorative Service Providers"

## 2021-03-19 ENCOUNTER — Encounter: Payer: Self-pay | Admitting: Family Medicine

## 2021-03-19 ENCOUNTER — Ambulatory Visit: Payer: Managed Care, Other (non HMO) | Admitting: Rehabilitative and Restorative Service Providers"

## 2021-03-19 DIAGNOSIS — M5442 Lumbago with sciatica, left side: Secondary | ICD-10-CM

## 2021-03-19 DIAGNOSIS — R2689 Other abnormalities of gait and mobility: Secondary | ICD-10-CM

## 2021-03-19 DIAGNOSIS — M533 Sacrococcygeal disorders, not elsewhere classified: Secondary | ICD-10-CM

## 2021-03-19 DIAGNOSIS — R293 Abnormal posture: Secondary | ICD-10-CM

## 2021-03-19 DIAGNOSIS — M25542 Pain in joints of left hand: Secondary | ICD-10-CM

## 2021-03-19 DIAGNOSIS — M6281 Muscle weakness (generalized): Secondary | ICD-10-CM

## 2021-03-19 NOTE — Telephone Encounter (Signed)
I don't order back injections.  Would need to see Dr. Darene Lamer or [facility].  Does she want to make an appt with them?

## 2021-03-19 NOTE — Therapy (Addendum)
[place], [place], [address] Phone: [phone]   Fax:  [phone]  Physical Therapy Treatment and Discharge Summary PHYSICAL THERAPY DISCHARGE SUMMARY  Visits from Start of Care: 3  Current functional level related to goals / functional outcomes: See progress note for discharge status    Remaining deficits: Unknown    Education / Equipment: HEP   Patient agrees to discharge. Patient goals were not met. Patient is being discharged due to not returning since the last visit.   P. Helene Kelp PT, MPH 05/01/21 11:34 AM  Patient Details  Name: Dawn Griffin MRN: [id number] Date of Birth: 1970-12-11 Referring Provider (PT): Hali Marry, MD   Encounter Date: 03/19/2021   PT End of Session - 03/19/21 0719     Visit Number 3    Number of Visits 6    Date for PT Re-Evaluation 04/20/21    Authorization Type [employer]    PT Start Time [phone]    PT Stop Time 0800    PT Time Calculation (min) 44 min    Activity Tolerance Patient tolerated treatment well;Patient limited by pain             Past Medical History:  Diagnosis Date   Aneurysm ([place]) 02/2020   Aortic atherosclerosis ([facility])    CREST (calcinosis, Raynaud's phenomenon, esophageal dysfunction, sclerodactyly, 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   Subjective Assessment - 03/19/21 0733     Subjective Patient reports increased  pain initially following last treatment. She then had improved pain for about 3 hours. Symptoms have continued over the weekend with radicular pain into the Lt LE. Difficulty finding a comfortable position to rest or sleep.    Currently in Pain? Yes    Pain Score 6     Pain Location Back    Pain Orientation Left    Pain Descriptors / Indicators Aching;Shooting;Sharp    Pain Type Acute pain    Pain Radiating Towards Lt LE    Pain Onset More than a month ago    Pain Frequency Intermittent                [facility] PT Assessment - 03/19/21 0001       Assessment   Medical Diagnosis M54.42 (ICD-10-CM) - Acute left-sided low back pain with left-sided sciatica    Referring Provider (PT) Hali Marry, MD    Onset Date/Surgical Date 03/03/21    Hand Dominance Right    Prior Therapy Yes, after prior back surgery      Palpation   Palpation comment muscular tightness Lt psoas QL, lumbar paraspinals; gluts; piriformis                           [facility] Adult PT Treatment/Exercise - 03/19/21 0001       Self-Care   ADL's diaphragmatic breathing  Lumbar Exercises: Supine   AB Set Limitations 3 part core 10 sec hold      Moist Heat Therapy   Number Minutes Moist Heat 10 Minutes    Moist Heat Location Lumbar Spine      Electrical Stimulation   Electrical Stimulation Location Lt lumbar to posterior hip    Electrical Stimulation Action TENS    Electrical Stimulation Parameters to tolerance    Electrical Stimulation Goals Pain;Tone      Manual Therapy   Manual therapy comments skilled palpation to assess response to DN and manual work    Soft tissue mobilization deep tissue work through the [employer] psoas with pt supine and Lt lumbar/QL area into the Lt posterior hip with pt prone over pillow              Trigger Point Dry Needling - 03/19/21 0001     Consent Given? Yes    Education Handout Provided Previously provided    Electrical Stimulation Performed  with Dry Needling Yes    Other Dry Needling Lt    Lumbar multifidi Response Palpable increased muscle length                        PT Long Term Goals - 03/09/21 1545       PT LONG TERM GOAL #1   Title Pt will be independent with HEP    Time 6    Period Weeks    Status New    Target Date 04/20/21      PT LONG TERM GOAL #2   Title Pt will be able to self correct SI alignment    Time 6    Period Weeks    Status New    Target Date 04/20/21      PT LONG TERM GOAL #3   Title Pt will demo at least 4/5 hip extensor and abductor strength bilat    Time 6    Period Weeks    Status New    Target Date 04/20/21      PT LONG TERM GOAL #4   Title Pt will report decrease in pain by at least 50% with all mobility    Time 6    Period Weeks    Status New    Target Date 04/20/21                   Plan - 03/19/21 0754     Clinical Impression Statement Temporoary relief of LBP and radicular pain with last treatment and again with today's treatment. Noted tightness in Lt psoas and lumbar musculature.  Overall patient continues to have LBP and Lt LE radicular pain. Encouraged pt to call MD for further diagnostic testing and/or intervention.    Rehab Potential Good    PT Frequency 1x / week    PT Duration 6 weeks    PT Treatment/Interventions ADLs/Self Care Home Management;Cryotherapy;Electrical Stimulation;Iontophoresis 53m/ml Dexamethasone;Moist Heat;Gait training;Stair training;Functional mobility training;Therapeutic activities;Therapeutic exercise;Balance training;Neuromuscular re-education;Manual techniques;Patient/family education;Dry needling;Passive range of motion;Taping    PT Next Visit Plan continue DN and manual work; assess response to HEP; Check SIJ alignment. Check for sacral torsion. Address accordingly. Stabilize pelvis/hip/core. Further assessment and treatment as indicated.    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   Rheumatoid arthritis involving both hands with positive rheumatoid factor ([place]) 12/12/2020   Pancreatic insufficiency 10/10/2020   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   GERD (gastroesophageal reflux disease) 07/06/2020   High risk medication use 05/25/2020   Vitamin D deficiency 05/25/2020   S/P coil embolization of cerebral aneurysm 02/28/2020   Centrilobular emphysema ([place]) 08/04/2019   Chronic fatigue 05/10/2019   Brain fog 05/10/2019   Myofascial pain 06/30/2018   Rheumatoid arthritis ([place]) 05/20/2017   Bilateral hand numbness 08/17/2013   Arthritis of left carpometacarpal joint 02/26/2013   SOB (shortness of breath) 02/26/2013   ANEMIA, HX OF 01/10/2010   URINARY URGENCY 12/21/2009   LYMPHADENOPATHY 08/15/2008     Nilda Simmer, PT, MPH  03/19/2021, 7:58 AM  [facility] [address] [place], [place], [address] Phone: [phone]   Fax:  [phone]  Name: Dawn Griffin MRN: [id number] Date of Birth: 1971-01-12

## 2021-03-21 ENCOUNTER — Encounter: Payer: Managed Care, Other (non HMO) | Admitting: Rehabilitative and Restorative Service Providers"

## 2021-03-22 ENCOUNTER — Encounter: Payer: Managed Care, Other (non HMO) | Admitting: Physical Therapy

## 2021-03-22 ENCOUNTER — Institutional Professional Consult (permissible substitution): Payer: Managed Care, Other (non HMO) | Admitting: Sports Medicine

## 2021-03-23 ENCOUNTER — Other Ambulatory Visit: Payer: Self-pay

## 2021-03-23 ENCOUNTER — Ambulatory Visit (INDEPENDENT_AMBULATORY_CARE_PROVIDER_SITE_OTHER): Payer: Managed Care, Other (non HMO)

## 2021-03-23 ENCOUNTER — Ambulatory Visit: Payer: Managed Care, Other (non HMO) | Admitting: Sports Medicine

## 2021-03-23 DIAGNOSIS — M5416 Radiculopathy, lumbar region: Secondary | ICD-10-CM | POA: Diagnosis not present

## 2021-03-23 DIAGNOSIS — M47816 Spondylosis without myelopathy or radiculopathy, lumbar region: Secondary | ICD-10-CM | POA: Insufficient documentation

## 2021-03-23 MED ORDER — TRAMADOL HCL 50 MG PO TABS: 50.0000 mg | ORAL_TABLET | Freq: Three times a day (TID) | ORAL | 0 refills | Status: DC | PRN

## 2021-03-23 NOTE — Assessment & Plan Note (Addendum)
This is a very pleasant 51 year old female, she is long history of axial low back pain with left-sided radicular symptoms, approximately 2 decades ago she did have an L5-S1 laminectomy. Did well until recently, increasing low back pain worse with standing, spinal extension, and laying in bed. Pain, numbness, tingling radiates down the left leg in an L5 and an S1 distribution to the bottom of the foot as well as the outer toes. She has had 6 weeks of formal physical therapy, medications, unfortunately due to continued pain we will proceed with advanced imaging and intervention, she has a vagus nerve stimulator, cannot have an MRI unless this is turned off which would need to be coordinated with [facility], due to her severe pain we will proceed with x-rays, lumbar spine CT followed by epidural, likely left L5-S1 and selective S1 epidurals. Adding tramadol for pain relief in the meantime. Return to see me 1 month after the injection which we will order as soon as I see her CT.  For insurance approval purposes she is having worsening/progressive weakness left lower extremity and has a contraindication to MRI.

## 2021-03-23 NOTE — Progress Notes (Signed)
° ° °  Procedures performed today:    None.  Independent interpretation of notes and tests performed by another provider:   None.  Brief History, Exam, Impression, and Recommendations:    Left lumbar radiculopathy This is a very pleasant 51 year old female, she is long history of axial low back pain with left-sided radicular symptoms, approximately 2 decades ago she did have an L5-S1 laminectomy. Did well until recently, increasing low back pain worse with standing, spinal extension, and laying in bed. Pain, numbness, tingling radiates down the left leg in an L5 and an S1 distribution to the bottom of the foot as well as the outer toes. She has had 6 weeks of formal physical therapy, medications, unfortunately due to continued pain we will proceed with advanced imaging and intervention, she has a vagus nerve stimulator, cannot have an MRI unless this is turned off which would need to be coordinated with [facility], due to her severe pain we will proceed with x-rays, lumbar spine CT followed by epidural, likely left L5-S1 and selective S1 epidurals. Adding tramadol for pain relief in the meantime. Return to see me 1 month after the injection which we will order as soon as I see her CT.  For insurance approval purposes she is having worsening/progressive weakness left lower extremity and has a contraindication to MRI.    ___________________________________________ Gwen Her. Dianah Field, M.D., ABFM., CAQSM. Primary Care and [employer] Instructor of [employer] [facility]

## 2021-03-27 ENCOUNTER — Encounter: Payer: Self-pay | Admitting: Sports Medicine

## 2021-03-28 ENCOUNTER — Encounter: Payer: Self-pay | Admitting: Professional

## 2021-03-28 ENCOUNTER — Ambulatory Visit (INDEPENDENT_AMBULATORY_CARE_PROVIDER_SITE_OTHER): Payer: 59 | Admitting: Professional

## 2021-03-28 ENCOUNTER — Encounter: Payer: Managed Care, Other (non HMO) | Admitting: Rehabilitative and Restorative Service Providers"

## 2021-03-28 DIAGNOSIS — F4323 Adjustment disorder with mixed anxiety and depressed mood: Secondary | ICD-10-CM | POA: Diagnosis not present

## 2021-03-28 NOTE — Progress Notes (Signed)
° ° ° ° ° ° ° ° ° ° ° ° ° ° °   , LCMHC °
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
## 2021-03-29 ENCOUNTER — Other Ambulatory Visit: Payer: Self-pay

## 2021-03-29 ENCOUNTER — Ambulatory Visit (INDEPENDENT_AMBULATORY_CARE_PROVIDER_SITE_OTHER): Payer: Managed Care, Other (non HMO)

## 2021-03-29 DIAGNOSIS — M545 Low back pain, unspecified: Secondary | ICD-10-CM | POA: Diagnosis not present

## 2021-03-29 DIAGNOSIS — M5416 Radiculopathy, lumbar region: Secondary | ICD-10-CM | POA: Diagnosis not present

## 2021-03-30 ENCOUNTER — Encounter: Payer: Self-pay | Admitting: Sports Medicine

## 2021-03-30 ENCOUNTER — Ambulatory Visit: Payer: Managed Care, Other (non HMO) | Admitting: Sports Medicine

## 2021-03-30 ENCOUNTER — Encounter: Payer: Self-pay | Admitting: Internal Medicine

## 2021-03-30 ENCOUNTER — Other Ambulatory Visit: Payer: Self-pay | Admitting: Family Medicine

## 2021-03-30 DIAGNOSIS — M0579 Rheumatoid arthritis with rheumatoid factor of multiple sites without organ or systems involvement: Secondary | ICD-10-CM

## 2021-03-30 DIAGNOSIS — M5416 Radiculopathy, lumbar region: Secondary | ICD-10-CM

## 2021-03-30 DIAGNOSIS — M47816 Spondylosis without myelopathy or radiculopathy, lumbar region: Secondary | ICD-10-CM

## 2021-03-30 MED ORDER — CYCLOBENZAPRINE HCL 10 MG PO TABS: 5.0000 mg | ORAL_TABLET | Freq: Every evening | ORAL | 0 refills | Status: DC | PRN

## 2021-03-30 NOTE — Addendum Note (Signed)
Addended by: Silverio Decamp on: 03/30/2021 03:16 PM   Modules accepted: Orders

## 2021-03-30 NOTE — Assessment & Plan Note (Signed)
Pleasant 51 year old female returns, long history of axial low back pain, history of L5-S1 laminectomy. More recently having axial low back pain worse with walking, as well as radicular symptoms down the left leg in an L5 distribution, there is some numbness and tingling outer fifth toe as well i.e. S1 nerve. Currently radicular discomfort is worse than axial discomfort so we will proceed with a left L5-S1 transforaminal epidural rather than facet joint injections. If insufficient relief from the L5-S1 transforaminal epidural we will proceed with bilateral L5-S1 facet joint injections.

## 2021-03-30 NOTE — Progress Notes (Signed)
° ° °  Procedures performed today:    None.  Independent interpretation of notes and tests performed by another provider:   None.  Brief History, Exam, Impression, and Recommendations:    Lumbar spondylosis Pleasant 51 year old female returns, long history of axial low back pain, history of L5-S1 laminectomy. More recently having axial low back pain worse with walking, as well as radicular symptoms down the left leg in an L5 distribution, there is some numbness and tingling outer fifth toe as well i.e. S1 nerve. Currently radicular discomfort is worse than axial discomfort so we will proceed with a left L5-S1 transforaminal epidural rather than facet joint injections. If insufficient relief from the L5-S1 transforaminal epidural we will proceed with bilateral L5-S1 facet joint injections.    ___________________________________________ Gwen Her. Dianah Field, M.D., ABFM., CAQSM. Primary Care and [place] Instructor of [place] [facility] of Medicine

## 2021-04-02 MED ORDER — ORENCIA CLICKJECT 125 MG/ML ~~LOC~~ SOAJ: SUBCUTANEOUS | 5 refills | Status: DC

## 2021-04-03 ENCOUNTER — Other Ambulatory Visit: Payer: Self-pay | Admitting: Internal Medicine

## 2021-04-03 ENCOUNTER — Telehealth: Payer: Self-pay | Admitting: Internal Medicine

## 2021-04-03 DIAGNOSIS — M0579 Rheumatoid arthritis with rheumatoid factor of multiple sites without organ or systems involvement: Secondary | ICD-10-CM

## 2021-04-03 NOTE — Telephone Encounter (Signed)
Patient called the office requesting a refill of Orencia 125mg /ml to be sent to [facility].

## 2021-04-03 NOTE — Telephone Encounter (Signed)
Left message to advise patient the prescription for Orencia was sent in on 04/02/2021.

## 2021-04-05 ENCOUNTER — Ambulatory Visit
Admission: RE | Admit: 2021-04-05 | Discharge: 2021-04-05 | Disposition: A | Discharge status: Home / Self Care | Payer: Managed Care, Other (non HMO) | Source: Ambulatory Visit | Attending: Sports Medicine | Admitting: Sports Medicine

## 2021-04-05 DIAGNOSIS — M47816 Spondylosis without myelopathy or radiculopathy, lumbar region: Secondary | ICD-10-CM

## 2021-04-05 MED ORDER — METHYLPREDNISOLONE ACETATE 40 MG/ML INJ SUSP (RADIOLOG
80.0000 mg | Freq: Once | INTRAMUSCULAR | Status: AC
  Administered 2021-04-05: 80 mg via EPIDURAL

## 2021-04-05 MED ORDER — IOPAMIDOL (ISOVUE-M 200) INJECTION 41%
1.0000 mL | Freq: Once | INTRAMUSCULAR | Status: AC
  Administered 2021-04-05: 1 mL via EPIDURAL

## 2021-04-05 NOTE — Discharge Instructions (Signed)

## 2021-04-05 NOTE — Telephone Encounter (Signed)
CTscan completed on 03/29/21.

## 2021-04-12 ENCOUNTER — Ambulatory Visit: Payer: 59 | Admitting: Professional

## 2021-04-25 ENCOUNTER — Ambulatory Visit: Payer: 59 | Admitting: Professional

## 2021-04-26 ENCOUNTER — Ambulatory Visit: Payer: 59 | Admitting: Professional

## 2021-04-27 ENCOUNTER — Other Ambulatory Visit: Payer: Self-pay

## 2021-04-27 ENCOUNTER — Ambulatory Visit: Payer: Managed Care, Other (non HMO) | Admitting: Sports Medicine

## 2021-04-27 ENCOUNTER — Ambulatory Visit: Payer: Managed Care, Other (non HMO) | Admitting: Family Medicine

## 2021-04-27 DIAGNOSIS — M47816 Spondylosis without myelopathy or radiculopathy, lumbar region: Secondary | ICD-10-CM

## 2021-04-27 NOTE — Assessment & Plan Note (Addendum)
This is a pleasant 51 year old female, long history of axial low back pain, she has a history of an L5-S1 laminectomy in the distant past. We have been treating her for axial low back pain with left-sided radicular symptoms, historically in an L5 distribution, more recently in an S1 distribution. She had a left L5-S1 transforaminal epidural, and has had good relief with regards to her L5 distribution radiculitis, she still has some paresthesias into the outer toe consistent with residual S1 radiculitis. In addition she has some axial back pain, right-sided, likely coming from her right L5-S1 arthritic facet. Again on exam she has no tenderness at the right sacroiliac joint. We had a long discussion, I gave her some anticipatory guidance, we are going to hold off on any further injection treatment, she still has discomfort but can live with it, if she has recurrence of back pain to the point she would like intervention we will do a right L5-S1 facet joint injection. If she has more radicular pain we will proceed with a left S1 epidural. Until then return as needed

## 2021-04-27 NOTE — Progress Notes (Signed)
° ° °  Procedures performed today:    None.  Independent interpretation of notes and tests performed by another provider:   None.  Brief History, Exam, Impression, and Recommendations:    Lumbar spondylosis This is a pleasant 51 year old female, long history of axial low back pain, she has a history of an L5-S1 laminectomy in the distant past. We have been treating her for axial low back pain with left-sided radicular symptoms, historically in an L5 distribution, more recently in an S1 distribution. She had a left L5-S1 transforaminal epidural, and has had good relief with regards to her L5 distribution radiculitis, she still has some paresthesias into the outer toe consistent with residual S1 radiculitis. In addition she has some axial back pain, right-sided, likely coming from her right L5-S1 arthritic facet. Again on exam she has no tenderness at the right sacroiliac joint. We had a long discussion, I gave her some anticipatory guidance, we are going to hold off on any further injection treatment, she still has discomfort but can live with it, if she has recurrence of back pain to the point she would like intervention we will do a right L5-S1 facet joint injection. If she has more radicular pain we will proceed with a left S1 epidural. Until then return as needed    ___________________________________________ Gwen Her. Dianah Field, M.D., ABFM., CAQSM. Primary Care and [place] Instructor of [place] [facility] of Medicine

## 2021-05-03 ENCOUNTER — Encounter: Payer: Self-pay | Admitting: Family Medicine

## 2021-05-09 ENCOUNTER — Ambulatory Visit: Payer: 59 | Admitting: Professional

## 2021-05-16 ENCOUNTER — Telehealth: Payer: Self-pay | Admitting: Pharmacist

## 2021-05-16 NOTE — Telephone Encounter (Signed)
Received Orencia PA renewal form from [place]. Completed and fax to [facility] with OV notes ? ?Fax: [phone] ?Phone: [phone] ? ?Knox Saliva, PharmD, MPH, BCPS ?Clinical Pharmacist (Rheumatology and Pulmonology) ?

## 2021-05-22 ENCOUNTER — Ambulatory Visit: Payer: 59 | Admitting: Professional

## 2021-05-24 NOTE — Telephone Encounter (Signed)
Informed that page 1 was missing. Form refaxed in entirety. Confirmed that fax receipt stated the expected number of pages. Will await f/u. ?

## 2021-05-29 ENCOUNTER — Ambulatory Visit: Payer: Managed Care, Other (non HMO) | Admitting: Internal Medicine

## 2021-05-31 NOTE — Telephone Encounter (Signed)
Received notification from [place] regarding a prior authorization for [facility]. Authorization has been APPROVED from 05/31/21 to 06/01/22.  ? ?Patient must continue to fill through [place]: [id number] ? ?Authorization # [id number] ? ?Knox Saliva, PharmD, MPH, BCPS ?Clinical Pharmacist (Rheumatology and Pulmonology) ?

## 2021-06-06 ENCOUNTER — Encounter: Payer: Self-pay | Admitting: Sports Medicine

## 2021-06-06 DIAGNOSIS — M47816 Spondylosis without myelopathy or radiculopathy, lumbar region: Secondary | ICD-10-CM

## 2021-06-06 NOTE — Addendum Note (Signed)
Addended by: Silverio Decamp on: 06/06/2021 01:26 PM ? ? Modules accepted: Orders ? ?

## 2021-06-26 NOTE — Telephone Encounter (Signed)
I reached out to Bagnell at [facility]. She stated that patient was scheduled for this Friday.  ?

## 2021-06-26 NOTE — Progress Notes (Signed)
? ?Office Visit Note ? ?Patient: Dawn Griffin             ?Date of Birth: 06/19/1970           ?MRN: [id number]             ?PCP: Hali Marry, MD ?Referring: Hali Marry, * ?Visit Date: 06/27/2021 ? ? ?Subjective:  ?Rheumatoid Arthritis (Doing good) ? ? ?History of Present Illness: Dawn Griffin is a 51 y.o. female here for follow up for RA/CREST overlap on orencia 125 mg [place] weekly, HCQ 300 mg daily, and MTX 10 mg PO weekly.  Her RA has been doing well followed up with her doctors in [place] felt this is stable.  She saw Dr. Sharol Roussel with no new concerning findings on skin exam at that time recommended to continue medications including the methotrexate. ?She has increase in low back pain since around Christmas time she thinks this was some type of overuse or injury when carrying kids around.  Since this started she had severe pain with left leg symptoms with numbness and pain going down the leg and in the foot.  She had updated imaging of her low back in January and epidural shot performed was very helpful for the radicular symptoms.  She has scheduled upcoming appointments with orthopedic surgery and neurosurgery about more definitive management options for this problem. ? ?Previous HPI ?02/26/21 ?Dawn Griffin is a 51 y.o. female here for follow up for RA/CREST overlap on orencia 125 mg [place] weekly, HCQ 300 mg daily, MTX 10 mg PO weekly, and folic acid 1 mg daily. We decreased methotrexate dose after last visit due to elevation in liver function tests.  Overall she feels her arthritis and scleroderma symptoms are pretty well controlled.  She is experiencing increased verity of Raynaud's symptoms with some new digital pitting during the colder weather but is very proactive with coats gloves and hot hands heating packs.  Continuing to have good GI symptom improvement with the Creon.  Overall she feels her mood and body aches are just overall worse and feels very down which she states has  come and gone periodically in previous months. ? ?  ?Previous HPI ?05/25/20 ?Dawn Griffin is a 51 y.o. female with history of carotid artery aneurysm and centrilobular emphysema here for evaluation of rheumatoid arthritis currently on methotrexate and orencia infusion. 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 She was first evaluated with rheumatology around 2014 due to hand pain, stiffness, skin changes, raynaud's, telangiectasias but not started any long term treatments until in 2018 with worsening symptoms was diagnosed with RA. She takes methotrexate and hydroxychloroquine for over a year without significant improvement in symptoms, mostly only feeling improvement when on low dose prednisone. Enbrel was added to her treatment but she does not feel this made a particularly good benefit in symptoms. She start Orencia infusion IV since about 2 months ago and also has not seen much improvement yet, although tolerating the treatment okay. She does not have much joint swelling most of the time, although in the past when she stopped all medicines for a time large bilateral knee swelling occurred and also hand swelling involving the whole hand and fingers. Doing okay today, currently on 10 mg prednisone she restarted taking it at 20 mg dose and decreased it feeling a bit worse but currently much better than off the medicine. ?  ?She had extensive cardiac workup in December with abnormal  EKG and subsequent TTE without any obvious problems and cardiac CT checked with no coronary disease problems seen. She was also found to have carotid artery aneurysm during evaluation for vagal nerve stimulator placement as a participant with RESET-RA trial. ?  ?She has skin thickening and Raynaud's numerous telangiectasias on hands bilaterally.  She is not currently on any medicine for this.  She has a few areas of small pitting or scarring.  She previously did not try calcium  channel blocker due to concern of side effects with a fairly low baseline blood pressure.  She has chronic diarrhea symptoms has had colonoscopy that was unremarkable.  She is also had swallowing study but does not describe prior upper GI endoscopy. ?  ?She has pulmonary emphysema but with good function on PFTs and is following up regularly with pulmonology.  This has been attributed to prior smoking history which she has to reduce but not stopped entirely.  She does have pretty frequent shortness of breath with exertion but no history of hypoxia or abnormal stress testing or PFTs.  She does not notice significant peripheral edema. ?  ?Labs reviewed ?04/2016 ?ANA 1:1280 centromere ?RF 16.3 ?CCP 117  ? ? ?Review of Systems  ?Constitutional:  Positive for fatigue.  ?HENT:  Positive for mouth dryness.   ?Eyes:  Negative for dryness.  ?Respiratory:  Positive for shortness of breath.   ?Cardiovascular:  Negative for swelling in legs/feet.  ?Gastrointestinal:  Positive for diarrhea.  ?Endocrine: Positive for cold intolerance and excessive thirst.  ?Genitourinary:  Negative for difficulty urinating.  ?Musculoskeletal:  Positive for joint pain, joint pain, joint swelling, muscle weakness and morning stiffness.  ?Skin:  Negative for rash.  ?Allergic/Immunologic: Negative for susceptible to infections.  ?Neurological:  Positive for numbness and weakness.  ?Hematological:  Negative for bruising/bleeding tendency.  ?Psychiatric/Behavioral:  Negative for sleep disturbance.   ? ?PMFS History:  ?Patient Active Problem List  ? Diagnosis Date Noted  ? Low back pain 06/27/2021  ? Adjustment disorder with mixed anxiety and depressed mood 03/28/2021  ? Lumbar spondylosis 03/23/2021  ? 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 Pancreatic insufficiency 10/10/2020  ? 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 GERD (gastroesophageal reflux disease)  07/06/2020  ? High risk medication use 05/25/2020  ? Vitamin D deficiency 05/25/2020  ? S/P coil embolization of cerebral aneurysm 02/28/2020  ? Centrilobular emphysema ([place]) 08/04/2019  ? Chronic fatigue 05/10/2019  ? Brain fog 05/10/2019  ? Myofascial pain 06/30/2018  ? Rheumatoid arthritis ([place]) 05/20/2017  ? Bilateral hand numbness 08/17/2013  ? Arthritis of left carpometacarpal joint 02/26/2013  ? SOB (shortness of breath) 02/26/2013  ? ANEMIA, HX OF 01/10/2010  ? URINARY URGENCY 12/21/2009  ? LYMPHADENOPATHY 08/15/2008  ?  ?Past Medical History:  ?Diagnosis Date  ? Aneurysm ([place]) 02/2020  ? Aortic atherosclerosis ([place])   ? 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 Emphysema of lung ([place])   ? GERD (gastroesophageal reflux disease)   ? Irritable bowel syndrome with diarrhea   ? Pancreatic divisum   ? Raynaud disease   ? Rheumatoid arthritis ([place])   ? Sessile colonic polyp   ?  ?Family History  ?Problem Relation Age of Onset  ? Bipolar disorder Mother   ? Lung cancer Mother   ? Lung cancer Father   ? Gout Father   ? Healthy Sister   ? Lupus Sister   ?  Healthy Brother   ? Arthritis/Rheumatoid Maternal Grandmother   ? Stomach cancer Paternal Grandmother   ? Colon cancer Neg Hx   ? Esophageal cancer Neg Hx   ? Colon polyps Neg Hx   ? Rectal cancer Neg Hx   ? ?Past Surgical History:  ?Procedure Laterality Date  ? ANEURYSM COILING  02/2020  ? FINGER ARTHROSCOPY WITH CARPOMETACARPEL [facility]) ARTHROPLASTY Left 03/2019  ? LUMBAR SPINE SURGERY  2001  ? L4-5-S1  ? TONSILLECTOMY  1990  ? VAGUS NERVE STIMULATOR INSERTION  09/2019  ? ?Social History  ? ?Social History Narrative  ? No exercise. + daily caffeine.   ? ?Immunization History  ?Administered Date(s) Administered  ? Influenza Whole 01/10/2010  ? Tdap 01/11/2013  ?  ? ?Objective: ?Vital Signs: BP 106/66 (BP Location: Left Arm, Patient Position: Sitting, Cuff Size: Normal)   Pulse 92   Resp 14   Ht 5'  4" (1.626 m)   Wt 122 lb (55.3 kg)   BMI 20.94 kg/m?   ? ?Physical Exam ?HENT:  ?   Mouth/Throat:  ?   Mouth: Mucous membranes are moist.  ?   Pharynx: Oropharynx is clear.  ?Eyes:  ?   Conjunctiva/sclera: Conjunc

## 2021-06-27 ENCOUNTER — Encounter: Payer: Self-pay | Admitting: Internal Medicine

## 2021-06-27 ENCOUNTER — Ambulatory Visit: Payer: Managed Care, Other (non HMO) | Admitting: Internal Medicine

## 2021-06-27 VITALS — BP 106/66 | HR 92 | Resp 14 | Ht 64.0 in | Wt 122.0 lb

## 2021-06-27 DIAGNOSIS — M05742 Rheumatoid arthritis with rheumatoid factor of left hand without organ or systems involvement: Secondary | ICD-10-CM

## 2021-06-27 DIAGNOSIS — M05741 Rheumatoid arthritis with rheumatoid factor of right hand without organ or systems involvement: Secondary | ICD-10-CM | POA: Diagnosis not present

## 2021-06-27 DIAGNOSIS — M341 CR(E)ST syndrome: Secondary | ICD-10-CM | POA: Diagnosis not present

## 2021-06-27 DIAGNOSIS — M545 Low back pain, unspecified: Secondary | ICD-10-CM

## 2021-06-27 DIAGNOSIS — M0579 Rheumatoid arthritis with rheumatoid factor of multiple sites without organ or systems involvement: Secondary | ICD-10-CM

## 2021-06-27 DIAGNOSIS — Z79899 Other long term (current) drug therapy: Secondary | ICD-10-CM | POA: Diagnosis not present

## 2021-06-28 LAB — COMPLETE METABOLIC PANEL WITH GFR
AG Ratio: 2 (calc) (ref 1.0–2.5)
ALT: 23 U/L (ref 6–29)
AST: 17 U/L (ref 10–35)
Albumin: 4.9 g/dL (ref 3.6–5.1)
Alkaline phosphatase (APISO): 45 U/L (ref 31–125)
BUN: 13 mg/dL (ref 7–25)
CO2: 26 mmol/L (ref 20–32)
Calcium: 10.3 mg/dL — ABNORMAL HIGH (ref 8.6–10.2)
Chloride: 101 mmol/L (ref 98–110)
Creat: 0.74 mg/dL (ref 0.50–0.99)
Globulin: 2.4 g/dL (calc) (ref 1.9–3.7)
Glucose, Bld: 115 mg/dL — ABNORMAL HIGH (ref 65–99)
Potassium: 4.4 mmol/L (ref 3.5–5.3)
Sodium: 137 mmol/L (ref 135–146)
Total Bilirubin: 0.4 mg/dL (ref 0.2–1.2)
Total Protein: 7.3 g/dL (ref 6.1–8.1)
eGFR: 100 mL/min/{1.73_m2} (ref >=60)

## 2021-06-28 LAB — CBC WITH DIFFERENTIAL/PLATELET
Absolute Monocytes: 183 cells/uL — ABNORMAL LOW (ref 200–950)
Basophils Absolute: 26 cells/uL (ref 0–200)
Basophils Relative: 0.2 %
Eosinophils Absolute: 0 cells/uL — ABNORMAL LOW (ref 15–500)
Eosinophils Relative: 0 %
HCT: 43.8 % (ref 35.0–45.0)
Hemoglobin: 14.6 g/dL (ref 11.7–15.5)
Lymphs Abs: 2175 cells/uL (ref 850–3900)
MCH: 31.3 pg (ref 27.0–33.0)
MCHC: 33.3 g/dL (ref 32.0–36.0)
MCV: 93.8 fL (ref 80.0–100.0)
MPV: 10.3 fL (ref 7.5–12.5)
Monocytes Relative: 1.4 %
Neutro Abs: 10716 cells/uL — ABNORMAL HIGH (ref 1500–7800)
Neutrophils Relative %: 81.8 %
Platelets: 278 10*3/uL (ref 140–400)
RBC: 4.67 10*6/uL (ref 3.80–5.10)
RDW: 13.3 % (ref 11.0–15.0)
Total Lymphocyte: 16.6 %
WBC: 13.1 10*3/uL — ABNORMAL HIGH (ref 3.8–10.8)

## 2021-06-28 LAB — C-REACTIVE PROTEIN: CRP: 1.5 mg/L (ref <8.0)

## 2021-06-29 ENCOUNTER — Ambulatory Visit
Admission: RE | Admit: 2021-06-29 | Discharge: 2021-06-29 | Disposition: A | Discharge status: Home / Self Care | Payer: Managed Care, Other (non HMO) | Source: Ambulatory Visit | Attending: Sports Medicine | Admitting: Sports Medicine

## 2021-06-29 DIAGNOSIS — M47816 Spondylosis without myelopathy or radiculopathy, lumbar region: Secondary | ICD-10-CM

## 2021-06-29 MED ORDER — IOPAMIDOL (ISOVUE-M 200) INJECTION 41%
1.0000 mL | Freq: Once | INTRAMUSCULAR | Status: AC
  Administered 2021-06-29: 1 mL via INTRA_ARTICULAR

## 2021-06-29 MED ORDER — METHYLPREDNISOLONE ACETATE 40 MG/ML INJ SUSP (RADIOLOG
80.0000 mg | Freq: Once | INTRAMUSCULAR | Status: AC
  Administered 2021-06-29: 80 mg via INTRA_ARTICULAR

## 2021-06-29 NOTE — Progress Notes (Signed)
Lab results look good. Her white blood cell count being abnormal is likely related to recent steroid effects. The liver function tests are now completely normal so no problems with methotrexate.

## 2021-06-29 NOTE — Discharge Instructions (Signed)

## 2021-06-30 MED ORDER — METHOTREXATE SODIUM 2.5 MG PO TABS
10.0000 mg | ORAL_TABLET | ORAL | Status: DC
Start: 2021-06-30 — End: 2021-09-12

## 2021-08-08 ENCOUNTER — Ambulatory Visit
Admission: RE | Admit: 2021-08-08 | Discharge: 2021-08-08 | Disposition: A | Discharge status: Home / Self Care | Payer: Managed Care, Other (non HMO) | Source: Ambulatory Visit | Attending: Family Medicine | Admitting: Family Medicine

## 2021-08-08 DIAGNOSIS — N63 Unspecified lump in unspecified breast: Secondary | ICD-10-CM

## 2021-08-21 ENCOUNTER — Encounter: Payer: Self-pay | Admitting: Internal Medicine

## 2021-08-21 DIAGNOSIS — M0579 Rheumatoid arthritis with rheumatoid factor of multiple sites without organ or systems involvement: Secondary | ICD-10-CM

## 2021-08-21 MED ORDER — ORENCIA CLICKJECT 125 MG/ML ~~LOC~~ SOAJ: SUBCUTANEOUS | 5 refills | Status: DC

## 2021-08-21 NOTE — Telephone Encounter (Signed)
Next Visit: 12/31/2021  Last Visit: 06/27/2021  Last Fill: 04/02/2021  DX: Rheumatoid arthritis involving both hands with positive rheumatoid factor   Current Dose per office note 06/27/2021: Orencia 125 mg subcu weekly   Labs: 06/27/2021 Lab results look good. Her white blood cell count being abnormal is likely related to recent steroid effects. The liver function tests are now completely normal so no problems with methotrexate.  TB Gold: 02/26/2021 Neg    Okay to refill Orencia?

## 2021-09-10 ENCOUNTER — Other Ambulatory Visit: Payer: Self-pay | Admitting: Internal Medicine

## 2021-09-10 DIAGNOSIS — M0579 Rheumatoid arthritis with rheumatoid factor of multiple sites without organ or systems involvement: Secondary | ICD-10-CM

## 2021-09-12 ENCOUNTER — Other Ambulatory Visit: Payer: Self-pay | Admitting: Internal Medicine

## 2021-09-12 ENCOUNTER — Other Ambulatory Visit: Payer: Self-pay

## 2021-09-12 MED ORDER — PANCRELIPASE (LIP-PROT-AMYL) 36000-114000 UNITS PO CPEP: ORAL_CAPSULE | ORAL | 2 refills | Status: DC

## 2021-10-03 ENCOUNTER — Other Ambulatory Visit: Payer: Self-pay | Admitting: *Deleted

## 2021-10-03 DIAGNOSIS — Z79899 Other long term (current) drug therapy: Secondary | ICD-10-CM

## 2021-10-04 LAB — COMPLETE METABOLIC PANEL WITH GFR
AG Ratio: 2 (calc) (ref 1.0–2.5)
ALT: 23 U/L (ref 6–29)
AST: 18 U/L (ref 10–35)
Albumin: 4.2 g/dL (ref 3.6–5.1)
Alkaline phosphatase (APISO): 43 U/L (ref 31–125)
BUN: 11 mg/dL (ref 7–25)
CO2: 27 mmol/L (ref 20–32)
Calcium: 9.5 mg/dL (ref 8.6–10.2)
Chloride: 104 mmol/L (ref 98–110)
Creat: 0.71 mg/dL (ref 0.50–0.99)
Globulin: 2.1 g/dL (calc) (ref 1.9–3.7)
Glucose, Bld: 103 mg/dL — ABNORMAL HIGH (ref 65–99)
Potassium: 4.4 mmol/L (ref 3.5–5.3)
Sodium: 139 mmol/L (ref 135–146)
Total Bilirubin: 0.3 mg/dL (ref 0.2–1.2)
Total Protein: 6.3 g/dL (ref 6.1–8.1)
eGFR: 105 mL/min/{1.73_m2} (ref >=60)

## 2021-10-04 LAB — CBC WITH DIFFERENTIAL/PLATELET
Absolute Monocytes: 714 cells/uL (ref 200–950)
Basophils Absolute: 38 cells/uL (ref 0–200)
Basophils Relative: 0.4 %
Eosinophils Absolute: 160 cells/uL (ref 15–500)
Eosinophils Relative: 1.7 %
HCT: 38.3 % (ref 35.0–45.0)
Hemoglobin: 12.7 g/dL (ref 11.7–15.5)
Lymphs Abs: 2773 cells/uL (ref 850–3900)
MCH: 32.1 pg (ref 27.0–33.0)
MCHC: 33.2 g/dL (ref 32.0–36.0)
MCV: 96.7 fL (ref 80.0–100.0)
MPV: 10.4 fL (ref 7.5–12.5)
Monocytes Relative: 7.6 %
Neutro Abs: 5715 cells/uL (ref 1500–7800)
Neutrophils Relative %: 60.8 %
Platelets: 214 10*3/uL (ref 140–400)
RBC: 3.96 10*6/uL (ref 3.80–5.10)
RDW: 12.4 % (ref 11.0–15.0)
Total Lymphocyte: 29.5 %
WBC: 9.4 10*3/uL (ref 3.8–10.8)

## 2021-10-08 NOTE — Progress Notes (Signed)
Lab results are all normal she can continue current medications.

## 2021-11-05 ENCOUNTER — Telehealth: Payer: Self-pay | Admitting: Internal Medicine

## 2021-11-05 NOTE — Telephone Encounter (Signed)
Pt states that her creon is not working for her, reports she is having to take Imodium daily. She has been taking 2 caps with meals and 1 with snacks. Please advise.

## 2021-11-05 NOTE — Telephone Encounter (Signed)
Patient called, states her Creon medication is not helping anymore, would like to discuss further with a nurse. Requesting a  call back as soon as possible. Please call top advise. Thank you.

## 2021-11-05 NOTE — Telephone Encounter (Signed)
Spoke with pt and she states that her last script stated for her to take 3 with meals. She reports she has been taking 4-5 with meals and it has not helped. Please advise.

## 2021-11-05 NOTE — Telephone Encounter (Signed)
Given previous response I would increase to 3 capsules with meals 1-2 with snacks depending on the size of the snack Have her try this for 7 to 10 days and let me know if its helping Office visit next available also reasonable; nonurgent

## 2021-11-05 NOTE — Telephone Encounter (Signed)
I spoke to patient by phone and interestingly not until her last prescription for Creon did the symptoms resume Loose urgent watery stools after eating; identical to prior to enzyme replacement therapy No fevers; does not feel like an infection to her No bleeding  Please gather Zenpep 40,000 unit/cap samples and have her come pick them up.  Would send enough for at least 7 days; 3 capsules with every meal 1-2 with snacks If this is effective then we will switch Creon to Zenpep If not effective she will let us know and we can try an alternative medication; if no longer responding to enzymes and it would argue for a different culprit as opposed to EPI

## 2021-11-06 NOTE — Telephone Encounter (Signed)
Zenpep samples left up front at 3rd floor desk for pt to pick up. She is aware.

## 2021-11-13 ENCOUNTER — Encounter: Payer: Self-pay | Admitting: Internal Medicine

## 2021-11-19 ENCOUNTER — Other Ambulatory Visit: Payer: Self-pay

## 2021-11-19 ENCOUNTER — Encounter: Payer: Self-pay | Admitting: Internal Medicine

## 2021-11-19 MED ORDER — VIBERZI 75 MG PO TABS: 75.0000 mg | ORAL_TABLET | Freq: Two times a day (BID) | ORAL | 3 refills | Status: DC

## 2021-11-19 NOTE — Telephone Encounter (Signed)
Patient has had an adequate trial of both Creon and Zenpep.  She continues to have frequent urgent diarrhea. Please let her know that we can try her on Viberzi 75 mg twice daily She can stop the pancreatic enzyme replacement. Because in her that Viberzi has rarely been associated with pancreatitis but this is typically only seen rarely in those without gallbladder.  Her gallbladder is intact and appeared normal by imaging in 2022.  That said if she experiences upper abdominal pain, nausea or vomiting associated with this medication she should stop it and let me know immediately.  Other possible side effects include constipation and abdominal pain.  If this occurs she should let me know.  We will start her on the lowest dose of this medication. She should also be cautioned not to drink alcohol excessively with this medication. Please let me know if she is willing to try this medication and schedule her a follow-up appointment. JMP

## 2021-12-20 NOTE — Progress Notes (Signed)
Office Visit Note  Patient: Dawn Griffin             Date of Birth: 1970-06-13           MRN: [id number]             PCP: Hali Marry, MD Referring: Hali Marry, * Visit Date: 12/31/2021   Subjective:  Follow-up (Feeling pretty good today. Some right shoulder pain. )   History of Present Illness: Dawn Griffin is a 51 y.o. 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 weekly and folic acid 1 mg daily.  Overall has been doing fairly well since her last visit without major symptom exacerbations.  She was switched on medicines for the pancreatic insufficiency due to loss of efficacy on her previous treatment and is doing well again.  She had some basal cell cancers excised uneventfully.  Continues to have joint pain affecting the right shoulder low back and left hip has not seen peripheral joint swelling come back.  Raynaud's symptoms have been reasonably controlled she is taking the ADHD medications only as needed.   Previous HPI 06/27/2021 Dawn Griffin is a 51 y.o. female here for follow up for RA/CREST overlap on orencia 125 mg [place] weekly, HCQ 300 mg daily, and MTX 10 mg PO weekly.  Her RA has been doing well followed up with her doctors in [place] felt this is stable.  She saw Dr. Sharol Roussel with no new concerning findings on skin exam at that time recommended to continue medications including the methotrexate. She has increase in low back pain since around Christmas time she thinks this was some type of overuse or injury when carrying kids around.  Since this started she had severe pain with left leg symptoms with numbness and pain going down the leg and in the foot.  She had updated imaging of her low back in January and epidural shot performed was very helpful for the radicular symptoms.  She has scheduled upcoming appointments with orthopedic surgery and neurosurgery about  more definitive management options for this problem.   Previous HPI 02/26/21 Dawn Griffin is a 51 y.o. female here for follow up for RA/CREST overlap on orencia 125 mg [place] weekly, HCQ 300 mg daily, MTX 10 mg PO weekly, and folic acid 1 mg daily. We decreased methotrexate dose after last visit due to elevation in liver function tests.  Overall she feels her arthritis and scleroderma symptoms are pretty well controlled.  She is experiencing increased verity of Raynaud's symptoms with some new digital pitting during the colder weather but is very proactive with coats gloves and hot hands heating packs.  Continuing to have good GI symptom improvement with the Creon.  Overall she feels her mood and body aches are just overall worse and feels very down which she states has come and gone periodically in previous months.     Previous HPI 05/25/20 Dawn Griffin is a 51 y.o. female with history of carotid artery aneurysm and centrilobular emphysema here for evaluation of rheumatoid arthritis currently on methotrexate and orencia infusion. 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 She was first evaluated with rheumatology around 2014 due to hand pain, stiffness, skin changes, raynaud's, telangiectasias but not started any long term treatments until in 2018 with worsening symptoms was diagnosed with RA. She takes methotrexate and hydroxychloroquine for over a year  without significant improvement in symptoms, mostly only feeling improvement when on low dose prednisone. Enbrel was added to her treatment but she does not feel this made a particularly good benefit in symptoms. She start Orencia infusion IV since about 2 months ago and also has not seen much improvement yet, although tolerating the treatment okay. She does not have much joint swelling most of the time, although in the past when she stopped all medicines for a time large bilateral knee swelling  occurred and also hand swelling involving the whole hand and fingers. Doing okay today, currently on 10 mg prednisone she restarted taking it at 20 mg dose and decreased it feeling a bit worse but currently much better than off the medicine.   She had extensive cardiac workup in December with abnormal EKG and subsequent TTE without any obvious problems and cardiac CT checked with no coronary disease problems seen. She was also found to have carotid artery aneurysm during evaluation for vagal nerve stimulator placement as a participant with RESET-RA trial.   She has skin thickening and Raynaud's numerous telangiectasias on hands bilaterally.  She is not currently on any medicine for this.  She has a few areas of small pitting or scarring.  She previously did not try calcium channel blocker due to concern of side effects with a fairly low baseline blood pressure.  She has chronic diarrhea symptoms has had colonoscopy that was unremarkable.  She is also had swallowing study but does not describe prior upper GI endoscopy.   She has pulmonary emphysema but with good function on PFTs and is following up regularly with pulmonology.  This has been attributed to prior smoking history which she has to reduce but not stopped entirely.  She does have pretty frequent shortness of breath with exertion but no history of hypoxia or abnormal stress testing or PFTs.  She does not notice significant peripheral edema.   Labs reviewed 04/2016 ANA 1:1280 centromere RF 16.3 CCP 117    Review of Systems  Constitutional:  Positive for fatigue.  HENT:  Positive for mouth dryness. Negative for mouth sores.   Eyes:  Negative for dryness.  Respiratory:  Positive for shortness of breath.   Cardiovascular:  Positive for chest pain and palpitations.  Gastrointestinal:  Negative for blood in stool, constipation and diarrhea.  Endocrine: Negative for increased urination.  Genitourinary:  Negative for involuntary urination.   Musculoskeletal:  Positive for joint pain, joint pain and morning stiffness. Negative for gait problem, joint swelling, myalgias, muscle weakness, muscle tenderness and myalgias.  Skin:  Negative for color change, rash, hair loss and sensitivity to sunlight.  Allergic/Immunologic: Negative for susceptible to infections.  Neurological:  Positive for dizziness. Negative for headaches.  Hematological:  Negative for swollen glands.  Psychiatric/Behavioral:  Negative for depressed mood and sleep disturbance. The patient is not nervous/anxious.     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   GERD (gastroesophageal reflux disease) 07/06/2020   High risk medication use 05/25/2020   Vitamin D deficiency 05/25/2020   S/P coil embolization of cerebral aneurysm 02/28/2020   Centrilobular emphysema ([place]) 08/04/2019  Chronic fatigue 05/10/2019   Brain fog 05/10/2019   Myofascial pain 06/30/2018   Rheumatoid arthritis ([place]) 05/20/2017   Bilateral hand numbness 08/17/2013   Arthritis of left carpometacarpal joint 02/26/2013   SOB (shortness of breath) 02/26/2013   ANEMIA, HX OF 01/10/2010   URINARY URGENCY 12/21/2009   LYMPHADENOPATHY 08/15/2008    Past Medical History:  Diagnosis Date   Aneurysm ([place]) 02/2020   Aortic atherosclerosis (HCC)    CREST (calcinosis, Raynaud's phenomenon, esophageal dysfunction, sclerodactyly, telangiectasia) (HCC) 2022   Emphysema of lung (HCC)    GERD (gastroesophageal reflux disease)    IBS (irritable bowel syndrome)    Irritable bowel syndrome with diarrhea     Pancreatic divisum    Raynaud disease    Rheumatoid arthritis ([place])    Sessile colonic polyp     Family History  Problem Relation Age of Onset   Bipolar disorder Mother    Lung cancer Mother    Lung cancer Father    Gout Father    Healthy Sister    Lupus Sister    Healthy Brother    Arthritis/Rheumatoid Maternal Grandmother    Stomach cancer Paternal Grandmother    Colon cancer Neg Hx    Esophageal cancer Neg Hx    Colon polyps Neg Hx    Rectal cancer Neg Hx    Past Surgical History:  Procedure Laterality Date   ANEURYSM COILING  02/2020   FINGER ARTHROSCOPY WITH CARPOMETACARPEL ([place]) ARTHROPLASTY Left 03/2019   LUMBAR SPINE SURGERY  2001   L4-5-S1   TONSILLECTOMY  1990   VAGUS NERVE STIMULATOR INSERTION  09/2019   Social History   Social History Narrative   No exercise. + daily caffeine.    Immunization History  Administered Date(s) Administered   Influenza Whole 01/10/2010   Tdap 01/11/2013     Objective: Vital Signs: BP 104/68 (BP Location: Right Arm, Patient Position: Sitting, Cuff Size: Normal)   Pulse 69   Resp 14   Ht '5\' 4"'$  (1.626 m)   Wt 125 lb 9.6 oz (57 kg)   BMI 21.56 kg/m    Physical Exam Eyes:     Conjunctiva/sclera: Conjunctivae normal.  Cardiovascular:     Rate and Rhythm: Normal rate and regular rhythm.  Pulmonary:     Effort: Pulmonary effort is normal.     Breath sounds: Normal breath sounds.  Musculoskeletal:     Right lower leg: No edema.     Left lower leg: No edema.  Skin:    General: Skin is warm and dry.     Comments: Numerous telangiectasias on both hands, some on lips Skin thickening distal MCPs on both hands No digital pitting Nailfold capillary dropout Longitudinal nail ridges  Neurological:     Mental Status: She is alert.  Psychiatric:        Mood and Affect: Mood normal.      Musculoskeletal Exam:  Shoulders full ROM some pain provoked in right shoulder with full external rotation while abducted Elbows full  ROM no tenderness or swelling Wrists full ROM no tenderness or swelling Fingers full ROM no tenderness or swelling Midline and left paraspinal tenderness to pressure at mid and lumbar spine Hip normal internal and external rotation left side lateral pain with internal rotation Knees full ROM no tenderness or swelling, patellofemoral crepitus present b/l   CDAI Exam: CDAI Score: 1  Patient Global: 10 mm; Provider Global: 0 mm Swollen: 0 ; Tender: 0  Joint Exam 12/31/2021   All  documented joints were normal     Investigation: No additional findings.  Imaging: No results found.  Recent Labs: Lab Results  Component Value Date   WBC 9.4 10/03/2021   HGB 12.7 10/03/2021   PLT 214 10/03/2021   NA 139 10/03/2021   K 4.4 10/03/2021   CL 104 10/03/2021   CO2 27 10/03/2021   GLUCOSE 103 (H) 10/03/2021   BUN 11 10/03/2021   CREATININE 0.71 10/03/2021   BILITOT 0.3 10/03/2021   ALKPHOS 38 03/18/2016   AST 18 10/03/2021   ALT 23 10/03/2021   PROT 6.3 10/03/2021   ALBUMIN 4.0 03/18/2016   CALCIUM 9.5 10/03/2021   GFRAA 102 09/06/2020   QFTBGOLDPLUS NEGATIVE 02/26/2021    Speciality Comments: Patient is scheduled for PLQ eye exam: 10/06/2020  Procedures:  No procedures performed Allergies: Patient has no known allergies.   Assessment / Plan:     Visit Diagnoses: Rheumatoid arthritis involving both hands with positive rheumatoid factor ([place]) - Plan: Sedimentation rate  Inflammatory arthritis looks well controlled I think current joint pains are more related to some degenerative disc disease some tendinitis or bursitis and myofascial pain contribution.  Rechecking sedimentation rate for disease activity monitoring.  Plan to continue Orencia 125 mg subcu weekly hydroxychloroquine 300 mg daily and methotrexate 10 mg p.o. weekly with folic acid 1 mg daily.  High risk medication use - Plan: CBC with Differential/Platelet, COMPLETE METABOLIC PANEL WITH GFR, QuantiFERON-TB Gold  Plus  Checking CBC and CMP for methotrexate and Orencia medication monitoring.  Checking TB screening updated for long-term continue treatment with Orencia.  CREST syndrome (HCC)  Skin change appears stable no large change in the extensive telangiectasias.  Raynaud's doing okay.  No digital pitting or ischemic changes.  Stay on famotidine for stomach acid suppression.  Low back pain, unspecified back pain laterality, unspecified chronicity, unspecified whether sciatica present  Mechanical part related to known lumbar spondylosis no evidence of new inflammatory involvement.   Orders: Orders Placed This Encounter  Procedures   Sedimentation rate   CBC with Differential/Platelet   COMPLETE METABOLIC PANEL WITH GFR   QuantiFERON-TB Gold Plus   No orders of the defined types were placed in this encounter.    Follow-Up Instructions: Return in about 6 months (around 07/02/2022) for RA/CREST overlap f/u 40mo.   CCollier Salina MD  Note - This record has been created using [employer]  Chart creation errors have been sought, but may not always  have been located. Such creation errors do not reflect on  the standard of medical care.

## 2021-12-26 ENCOUNTER — Telehealth (INDEPENDENT_AMBULATORY_CARE_PROVIDER_SITE_OTHER): Payer: Managed Care, Other (non HMO) | Admitting: Family Medicine

## 2021-12-26 DIAGNOSIS — M47816 Spondylosis without myelopathy or radiculopathy, lumbar region: Secondary | ICD-10-CM

## 2021-12-26 DIAGNOSIS — R002 Palpitations: Secondary | ICD-10-CM

## 2021-12-26 DIAGNOSIS — R4189 Other symptoms and signs involving cognitive functions and awareness: Secondary | ICD-10-CM | POA: Diagnosis not present

## 2021-12-26 DIAGNOSIS — R5382 Chronic fatigue, unspecified: Secondary | ICD-10-CM

## 2021-12-26 DIAGNOSIS — M05741 Rheumatoid arthritis with rheumatoid factor of right hand without organ or systems involvement: Secondary | ICD-10-CM | POA: Diagnosis not present

## 2021-12-26 DIAGNOSIS — M05742 Rheumatoid arthritis with rheumatoid factor of left hand without organ or systems involvement: Secondary | ICD-10-CM

## 2021-12-26 MED ORDER — DULOXETINE HCL 30 MG PO CPEP: 30.0000 mg | ORAL_CAPSULE | Freq: Every day | ORAL | 1 refills | Status: DC

## 2021-12-26 MED ORDER — AMPHETAMINE-DEXTROAMPHETAMINE 15 MG PO TABS: 15.0000 mg | ORAL_TABLET | Freq: Two times a day (BID) | ORAL | 0 refills | Status: DC

## 2021-12-26 NOTE — Assessment & Plan Note (Signed)
Has follow-up with Dr. Benjamine Mola next week.

## 2021-12-26 NOTE — Progress Notes (Signed)
Pt reports that about 1 year ago she was given Adderall 15 mg BID. She would like to restart this.   She was previously seen by Dr. Darene Lamer for her back but wanted Dr. Gardiner Ramus opinion.

## 2021-12-26 NOTE — Assessment & Plan Note (Signed)
Go ahead and refill Adderall for as needed use.

## 2021-12-26 NOTE — Progress Notes (Signed)
Virtual Visit via Video Note  I connected with Dawn Griffin on 12/26/21 at 10:50 AM EDT by a video enabled telemedicine application and verified that I am speaking with the correct person using two identifiers.   I discussed the limitations of evaluation and management by telemedicine and the availability of in person appointments. The patient expressed understanding and agreed to proceed.  Patient location: at home Provider location: in office  Subjective:    CC:  No chief complaint on file.   HPI: Her biggest issue lately has been her back she is really been struggling with back pain and she has seen Dr. Darene Lamer our sports med doc.  She seen an orthopedist and she is even seen a Publishing rights manager.  She has pretty severe degeneration at L5-S1 with some what sounds like anterior listhesis.  They have recommended surgery but she really does not want to have surgery.  Dr. Dianah Field had spoken to her about maybe using tramadol for pain control but she has been very hesitant and wanted to discuss possible options.  Pt reports that about 1 year ago she was given Adderall 15 mg BID. She would like to restart this.  She rewrote a 90-day supply and she really just uses it as needed and that lasted her almost a year so she would like to refill it that way if possible.  Does follow-up with rheumatology next week.  When she last saw him 6 months ago she was actually doing really well.  She also reports over the last 6 months she has had some occasional palpitations she will just feel most like the heart skips or jumps just for a moment.  It does not last or persist its occasional.  Specific triggers.  A pretty extensive cardiac work-up a couple of years ago and everything looked reassuring even though she does have a abnormal baseline EKG.  Denies feeling anxious.   Past medical history, Surgical history, Family history not pertinant except as noted below, Social history, Allergies, and medications have  been entered into the medical record, reviewed, and corrections made.    Objective:    General: Speaking clearly in complete sentences without any shortness of breath.  Alert and oriented x3.  Normal judgment. No apparent acute distress.    Impression and Recommendations:    Problem List Items Addressed This Visit       Musculoskeletal and Integument   Rheumatoid arthritis involving both hands with positive rheumatoid factor ([place])    Has follow-up with Dr. Benjamine Mola next week.      Lumbar spondylosis    We discussed options.  She had tried Cymbalta before but was in a little bit different situation with her pain at that time so we discussed maybe retrying it as studies do show about a 30% reduction in chronic musculoskeletal pain and it would be something that would not be habit-forming is generic and is pretty well-tolerated in general.  She is willing to try it again so we will start with 30 mg daily for a month and if needed okay to go up to 60 mg after that.  If its not helpful then we could certainly look at other options.  Tramadol would still be an option as well.  She really wants to hold off on surgery right now.      Relevant Medications   DULoxetine (CYMBALTA) 30 MG capsule     Other   Chronic fatigue    Go ahead and refill Adderall for  as needed use.      Brain fog - Primary   Relevant Medications   amphetamine-dextroamphetamine (ADDERALL) 15 MG tablet   Other Visit Diagnoses     Palpitations           Palpitations-it sounds most consistent with PVCs or PACs.  Since they are infrequent they are most likely benign especially with a fairly recent cardiac work-up.  If they start to become more frequent or cluster then I would recommend a cardiac monitor.  Discussed things that can trigger excess palpitations such as caffeine, stress and lack of sleep.  No orders of the defined types were placed in this encounter.   Meds ordered this encounter  Medications    amphetamine-dextroamphetamine (ADDERALL) 15 MG tablet    Sig: Take 1 tablet by mouth 2 (two) times daily.    Dispense:  180 tablet    Refill:  0   DULoxetine (CYMBALTA) 30 MG capsule    Sig: Take 1 capsule (30 mg total) by mouth daily.    Dispense:  30 capsule    Refill:  1     I discussed the assessment and treatment plan with the patient. The patient was provided an opportunity to ask questions and all were answered. The patient agreed with the plan and demonstrated an understanding of the instructions.   The patient was advised to call back or seek an in-person evaluation if the symptoms worsen or if the condition fails to improve as anticipated.   Beatrice Lecher, MD

## 2021-12-26 NOTE — Assessment & Plan Note (Signed)
We discussed options.  She had tried Cymbalta before but was in a little bit different situation with her pain at that time so we discussed maybe retrying it as studies do show about a 30% reduction in chronic musculoskeletal pain and it would be something that would not be habit-forming is generic and is pretty well-tolerated in general.  She is willing to try it again so we will start with 30 mg daily for a month and if needed okay to go up to 60 mg after that.  If its not helpful then we could certainly look at other options.  Tramadol would still be an option as well.  She really wants to hold off on surgery right now.

## 2021-12-31 ENCOUNTER — Encounter: Payer: Self-pay | Admitting: Internal Medicine

## 2021-12-31 ENCOUNTER — Ambulatory Visit: Payer: Managed Care, Other (non HMO) | Attending: Internal Medicine | Admitting: Internal Medicine

## 2021-12-31 VITALS — BP 104/68 | HR 69 | Resp 14 | Ht 64.0 in | Wt 125.6 lb

## 2021-12-31 DIAGNOSIS — M05742 Rheumatoid arthritis with rheumatoid factor of left hand without organ or systems involvement: Secondary | ICD-10-CM

## 2021-12-31 DIAGNOSIS — Z79899 Other long term (current) drug therapy: Secondary | ICD-10-CM | POA: Diagnosis not present

## 2021-12-31 DIAGNOSIS — M341 CR(E)ST syndrome: Secondary | ICD-10-CM

## 2021-12-31 DIAGNOSIS — G8929 Other chronic pain: Secondary | ICD-10-CM

## 2021-12-31 DIAGNOSIS — M25511 Pain in right shoulder: Secondary | ICD-10-CM

## 2021-12-31 DIAGNOSIS — M545 Low back pain, unspecified: Secondary | ICD-10-CM

## 2021-12-31 DIAGNOSIS — M05741 Rheumatoid arthritis with rheumatoid factor of right hand without organ or systems involvement: Secondary | ICD-10-CM | POA: Diagnosis not present

## 2022-01-04 NOTE — Progress Notes (Signed)
Lab results all look fine her sedimentation rate is normal and no problems with continuing her current medications.

## 2022-01-05 LAB — CBC WITH DIFFERENTIAL/PLATELET
Absolute Monocytes: 540 cells/uL (ref 200–950)
Basophils Absolute: 42 cells/uL (ref 0–200)
Basophils Relative: 0.5 %
Eosinophils Absolute: 91 cells/uL (ref 15–500)
Eosinophils Relative: 1.1 %
HCT: 38.2 % (ref 35.0–45.0)
Hemoglobin: 12.6 g/dL (ref 11.7–15.5)
Lymphs Abs: 2905 cells/uL (ref 850–3900)
MCH: 31.5 pg (ref 27.0–33.0)
MCHC: 33 g/dL (ref 32.0–36.0)
MCV: 95.5 fL (ref 80.0–100.0)
MPV: 10.7 fL (ref 7.5–12.5)
Monocytes Relative: 6.5 %
Neutro Abs: 4723 cells/uL (ref 1500–7800)
Neutrophils Relative %: 56.9 %
Platelets: 237 10*3/uL (ref 140–400)
RBC: 4 10*6/uL (ref 3.80–5.10)
RDW: 11.9 % (ref 11.0–15.0)
Total Lymphocyte: 35 %
WBC: 8.3 10*3/uL (ref 3.8–10.8)

## 2022-01-05 LAB — COMPLETE METABOLIC PANEL WITH GFR
AG Ratio: 2 (calc) (ref 1.0–2.5)
ALT: 27 U/L (ref 6–29)
AST: 21 U/L (ref 10–35)
Albumin: 4.1 g/dL (ref 3.6–5.1)
Alkaline phosphatase (APISO): 46 U/L (ref 31–125)
BUN: 13 mg/dL (ref 7–25)
CO2: 26 mmol/L (ref 20–32)
Calcium: 9.3 mg/dL (ref 8.6–10.2)
Chloride: 106 mmol/L (ref 98–110)
Creat: 0.71 mg/dL (ref 0.50–0.99)
Globulin: 2.1 g/dL (calc) (ref 1.9–3.7)
Glucose, Bld: 98 mg/dL (ref 65–99)
Potassium: 4.3 mmol/L (ref 3.5–5.3)
Sodium: 139 mmol/L (ref 135–146)
Total Bilirubin: 0.5 mg/dL (ref 0.2–1.2)
Total Protein: 6.2 g/dL (ref 6.1–8.1)
eGFR: 104 mL/min/{1.73_m2} (ref >=60)

## 2022-01-05 LAB — QUANTIFERON-TB GOLD PLUS
Mitogen-NIL: 8.33 IU/mL
NIL: 0.01 IU/mL
QuantiFERON-TB Gold Plus: NEGATIVE
TB1-NIL: 0.01 IU/mL
TB2-NIL: 0.01 IU/mL

## 2022-01-05 LAB — SEDIMENTATION RATE: Sed Rate: 2 mm/h (ref 0–20)

## 2022-01-10 ENCOUNTER — Telehealth: Payer: Managed Care, Other (non HMO) | Admitting: Family Medicine

## 2022-01-14 ENCOUNTER — Telehealth: Payer: Managed Care, Other (non HMO) | Admitting: Family Medicine

## 2022-01-15 ENCOUNTER — Ambulatory Visit: Payer: Managed Care, Other (non HMO) | Admitting: Internal Medicine

## 2022-01-15 ENCOUNTER — Encounter: Payer: Self-pay | Admitting: Internal Medicine

## 2022-01-15 VITALS — BP 100/60 | HR 71 | Ht 64.0 in | Wt 124.4 lb

## 2022-01-15 DIAGNOSIS — K219 Gastro-esophageal reflux disease without esophagitis: Secondary | ICD-10-CM | POA: Diagnosis not present

## 2022-01-15 DIAGNOSIS — K580 Irritable bowel syndrome with diarrhea: Secondary | ICD-10-CM | POA: Diagnosis not present

## 2022-01-15 MED ORDER — VIBERZI 75 MG PO TABS: 75.0000 mg | ORAL_TABLET | Freq: Two times a day (BID) | ORAL | 3 refills | Status: DC

## 2022-01-15 NOTE — Progress Notes (Signed)
Subjective:    Patient ID: Dawn Griffin, female    DOB: 26-Sep-1970, 51 y.o.   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  She is here alone today and I last saw her in December 2022.  She initially had a seemingly good response to Zenpep and Creon after having a negative colonoscopy for microscopic colitis and a low fecal elastase (46).  However after trying pancreatic enzymes for multiple months they seem to lose efficacy and she was having near constant loose stool symptoms.  We changed her to Viberzi 75 mg twice daily 2 months ago and she reports it has been like a miracle.  She has not had diarrhea since starting Viberzi.  She has noticed some slight constipation recently while traveling to [place] but on returning home bowel movements are formed and regular on a daily basis.  She has not had any other issues from an abdominal standpoint.  On the whole she does not have any real appetite but this is not new.  She has gained about 5 pounds since starting Viberzi.  She has not been using any multivitamins of late but was taking a vitamin supplement provided by Creon when she was taking that medication.  She will intermittently use Adderall but not on a daily basis.  She uses this for her brain fog and fatigue associated with RA flares.  90 tablets lasted her 15 months.  She definitely has decreased appetite when she uses this medication.   Review of Systems As per HPI, otherwise negative  Current Medications, Allergies, Past Medical History, Past Surgical History, Family History and Social History were reviewed in [employer] record.    Objective:   Physical Exam BP 100/60 (BP Location: Left Arm, Patient Position: Sitting, Cuff Size: Normal)   Pulse 71   Ht '5\' 4"'$  (1.626  m)   Wt 124 lb 6 oz (56.4 kg)   SpO2 98%   BMI 21.35 kg/m  Gen: awake, alert, NAD HEENT: anicteric Neuro: nonfocal     Latest Ref Rng & Units 12/31/2021   11:46 AM 10/03/2021    3:07 PM 06/27/2021    3:29 PM  CBC  WBC 3.8 - 10.8 Thousand/uL 8.3  9.4  13.1   Hemoglobin 11.7 - 15.5 g/dL 12.6  12.7  14.6   Hematocrit 35.0 - 45.0 % 38.2  38.3  43.8   Platelets 140 - 400 Thousand/uL 237  214  278    CMP     Component Value Date/Time   NA 139 12/31/2021 1146   K 4.3 12/31/2021 1146   CL 106 12/31/2021 1146   CO2 26 12/31/2021 1146   GLUCOSE 98 12/31/2021 1146   BUN 13 12/31/2021 1146   CREATININE 0.71 12/31/2021 1146   CALCIUM 9.3 12/31/2021 1146   PROT 6.2 12/31/2021 1146   ALBUMIN 4.0 03/18/2016 0803   AST 21 12/31/2021 1146   ALT 27 12/31/2021 1146   ALKPHOS 38 03/18/2016 0803   BILITOT 0.5 12/31/2021 1146   GFRNONAA 88 09/06/2020 1405   GFRAA 102 09/06/2020 1405       Assessment & Plan:  51 year old female with a history of IBS-D, 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  Viberzi has definitively been effective.  She wants to continue this medication which I think is reasonable.  However I would like to repeat fecal elastase while stools are formed.  If still low we may consider rechecking fat-soluble vitamins to ensure there are no vitamin deficiencies.  If this remains low we may need to consider adding back Creon so that malabsorption does not occur but this also may need titration of Viberzi dosing to avoid constipation. -- Continue Viberzi 75 mg twice daily -- Daily oral multivitamin without iron recommended -- Repeat fecal elastase; if low recheck fat-soluble vitamins; it is reassuring  that albumin is normal and there is certainly no evidence of clinical malnutrition.  Weight is 124 pounds with a BMI of 21.4  2.  History of adenomatous and sessile serrated colon polyps --surveillance colonoscopy August 2025  3.  History of GERD and H. pylori negative gastritis --famotidine 20 mg twice daily as needed  61-monthfollow-up, sooner if needed  30 minutes total spent today including patient facing time, coordination of care, reviewing medical history/procedures/pertinent radiology studies, and documentation of the encounter.

## 2022-01-15 NOTE — Patient Instructions (Signed)
If you are age 49 or younger, your body mass index should be between 19-25. Your Body mass index is 21.35 kg/m. If this is out of the aformentioned range listed, please consider follow up with your Primary Care Provider.  ________________________________________________________  The [facility] providers would like to encourage you to use [facility] to communicate with providers for non-urgent requests or questions.  Due to long hold times on the telephone, sending your provider a message by [facility] may be a faster and more efficient way to get a response.  Please allow 48 business hours for a response.  Please remember that this is for non-urgent requests.  _______________________________________________________  Your provider has requested that you go to the basement level for lab work before leaving today. Press "B" on the elevator. The lab is located at the first door on the left as you exit the elevator.  Due to recent changes in healthcare laws, you may see the results of your imaging and laboratory studies on MyChart before your provider has had a chance to review them.  We understand that in some cases there may be results that are confusing or concerning to you. Not all laboratory results come back in the same time frame and the provider may be waiting for multiple results in order to interpret others.  Please give [place] 48 hours in order for your provider to thoroughly review all the results before contacting the office for clarification of your results.   Please purchase the following medications over the counter and take as directed:  START: Centrum Silver Multivitamin without Iron daily.  You will need a follow up appointment in 1 year.  We will contact you to schedule this appointment.  We have sent the following medications to your pharmacy for you to pick up at your convenience:  Viberzi '75mg'$  one tablet twice daily.  Thank you for entrusting me with your care and choosing [facility].  Dr Hilarie Fredrickson

## 2022-02-04 ENCOUNTER — Other Ambulatory Visit: Payer: Self-pay | Admitting: Internal Medicine

## 2022-02-04 DIAGNOSIS — M0579 Rheumatoid arthritis with rheumatoid factor of multiple sites without organ or systems involvement: Secondary | ICD-10-CM

## 2022-02-04 NOTE — Telephone Encounter (Signed)
Next Visit: 07/03/2022  Last Visit: 12/31/2021  Last Fill: 09/10/2021  DX: Rheumatoid arthritis involving both hands with positive rheumatoid factor   Current Dose per office note 12/31/2021: Orencia 125 mg subcu weekly   Labs: 12/31/2021 Lab results all look fine her sedimentation rate is normal and no problems with continuing her current medications.   TB Gold: 12/31/2021 Neg    Okay to refill Orencia?

## 2022-02-11 ENCOUNTER — Other Ambulatory Visit: Payer: Managed Care, Other (non HMO)

## 2022-02-11 DIAGNOSIS — K580 Irritable bowel syndrome with diarrhea: Secondary | ICD-10-CM

## 2022-02-17 ENCOUNTER — Other Ambulatory Visit: Payer: Self-pay | Admitting: Family Medicine

## 2022-02-17 DIAGNOSIS — M47816 Spondylosis without myelopathy or radiculopathy, lumbar region: Secondary | ICD-10-CM

## 2022-02-18 ENCOUNTER — Other Ambulatory Visit: Payer: Self-pay | Admitting: Family Medicine

## 2022-02-18 DIAGNOSIS — M47816 Spondylosis without myelopathy or radiculopathy, lumbar region: Secondary | ICD-10-CM

## 2022-02-18 LAB — PANCREATIC ELASTASE, FECAL: Pancreatic Elastase-1, Stool: <15 mcg/g — ABNORMAL LOW

## 2022-02-20 ENCOUNTER — Other Ambulatory Visit: Payer: Self-pay

## 2022-02-20 DIAGNOSIS — K8689 Other specified diseases of pancreas: Secondary | ICD-10-CM

## 2022-02-27 ENCOUNTER — Ambulatory Visit (INDEPENDENT_AMBULATORY_CARE_PROVIDER_SITE_OTHER)
Admission: RE | Admit: 2022-02-27 | Discharge: 2022-02-27 | Disposition: A | Discharge status: Home / Self Care | Payer: Managed Care, Other (non HMO) | Source: Ambulatory Visit | Attending: Internal Medicine | Admitting: Internal Medicine

## 2022-02-27 ENCOUNTER — Other Ambulatory Visit (INDEPENDENT_AMBULATORY_CARE_PROVIDER_SITE_OTHER): Payer: Managed Care, Other (non HMO)

## 2022-02-27 DIAGNOSIS — K8689 Other specified diseases of pancreas: Secondary | ICD-10-CM

## 2022-02-27 LAB — VITAMIN B12: Vitamin B-12: 692 pg/mL (ref 211–911)

## 2022-02-27 LAB — VITAMIN D 25 HYDROXY (VIT D DEFICIENCY, FRACTURES): VITD: 57.03 ng/mL (ref 30.00–100.00)

## 2022-02-27 LAB — PROTIME-INR
INR: 1.1 ratio — ABNORMAL HIGH (ref 0.8–1.0)
Prothrombin Time: 12.5 s (ref 9.6–13.1)

## 2022-03-03 LAB — VITAMIN A: Vitamin A (Retinoic Acid): 54 ug/dL (ref 38–98)

## 2022-03-03 LAB — VITAMIN E
Gamma-Tocopherol (Vit E): <1 mg/L (ref <=4.3)
Vitamin E (Alpha Tocopherol): 16.7 mg/L (ref 5.7–19.9)

## 2022-03-11 ENCOUNTER — Other Ambulatory Visit: Payer: Self-pay | Admitting: Internal Medicine

## 2022-03-11 DIAGNOSIS — M0579 Rheumatoid arthritis with rheumatoid factor of multiple sites without organ or systems involvement: Secondary | ICD-10-CM

## 2022-03-12 ENCOUNTER — Encounter: Payer: Self-pay | Admitting: *Deleted

## 2022-03-12 NOTE — Telephone Encounter (Signed)
Next Visit: 07/03/2022   Last Visit: 12/31/2021   Last Fill: 09/12/2021 (MTX), 4/40/1027 (Folic Acid and PLQ)  DX: Rheumatoid arthritis involving both hands with positive rheumatoid factor    Current Dose per office note 12/31/2021: hydroxychloroquine 300 mg daily and methotrexate 10 mg p.o. weekly with folic acid 1 mg daily   Labs: 12/31/2021 Lab results all look fine her sedimentation rate is normal and no problems with continuing her current medications.    PLQ Eye Exam not on file. Sent patient a my chart message to inquire if she has had one.   Okay to refill MTX, Folic Acid and PLQ?

## 2022-03-13 MED ORDER — HYDROXYCHLOROQUINE SULFATE 200 MG PO TABS: 200.0000 mg | ORAL_TABLET | Freq: Every day | ORAL | 1 refills | Status: DC

## 2022-03-13 MED ORDER — METHOTREXATE SODIUM 2.5 MG PO TABS: 20.0000 mg | ORAL_TABLET | ORAL | 0 refills | Status: DC

## 2022-03-13 MED ORDER — FOLIC ACID 1 MG PO TABS: 1.0000 mg | ORAL_TABLET | Freq: Every day | ORAL | 3 refills | Status: DC

## 2022-03-20 ENCOUNTER — Encounter: Payer: Self-pay | Admitting: Internal Medicine

## 2022-03-21 ENCOUNTER — Telehealth: Payer: Self-pay | Admitting: Pharmacist

## 2022-03-21 DIAGNOSIS — M0579 Rheumatoid arthritis with rheumatoid factor of multiple sites without organ or systems involvement: Secondary | ICD-10-CM

## 2022-03-21 NOTE — Telephone Encounter (Signed)
Patient had change in insurance and needs updated PA for her Orencia. Submitted a Prior Authorization request to [facility] for [facility] via CoverMyMeds. Will update once we receive a response.  Key: [id number]  Knox Saliva, PharmD, MPH, BCPS, CPP Clinical Pharmacist (Rheumatology and Pulmonology)

## 2022-03-22 ENCOUNTER — Other Ambulatory Visit (HOSPITAL_COMMUNITY): Payer: Self-pay

## 2022-03-22 MED ORDER — ORENCIA CLICKJECT 125 MG/ML ~~LOC~~ SOAJ: 125.0000 mg | SUBCUTANEOUS | 0 refills | Status: DC

## 2022-03-22 NOTE — Telephone Encounter (Signed)
Received notification from [facility] regarding a prior authorization for [facility]. Denial has been OVERTURNED. Authorization has been APPROVED from 03/22/2022 to 09/20/2022. Approval letter sent to scan center.  Unable to run test claim. Patient must fill through [address]: [phone] . Rx sent for 1 month only since patient is due for labs later this month.  Authorization # [id number]  MyChart message sent to pt to advise. She will have to provide copay card information to pharmacy. She had enrolled and our clinic staff does not have access to it.  Knox Saliva, PharmD, MPH, BCPS, CPP Clinical Pharmacist (Rheumatology and Pulmonology)

## 2022-03-22 NOTE — Telephone Encounter (Signed)
Received a fax regarding Prior Authorization from [facility] for [facility]. Authorization has been DENIED because: .  Per your health plan's criteria, this drug is covered if you meet the following: (1) One of the following: (A) There are paid claims or your doctor submits medical records (for example: chart notes) showing you have tried or cannot use (or attestation demonstrating a trial may be inappropriate) to two of the following: Cimzia, Enbrel, Humira/Amjevita/Cyltezo/Hyrimoz/Brand Adalimumab-adaz, Rinvoq, Simponi, Xeljanz/Xeljanz XR. (B) There are paid claims or your doctor submits medical records (for example: chart notes) showing you are continuing the therapy (defined as no more than a 45-day gap in therapy  Submitted an EXPEDITED appeal to [facility] for [facility]. Included screenshot of dispensing history since 2022  Reference # PA Case ID #: [id number] Phone: [phone] Fax: [phone]  Knox Saliva, PharmD, MPH, BCPS, CPP Clinical Pharmacist (Rheumatology and Pulmonology)

## 2022-03-29 ENCOUNTER — Telehealth: Payer: Self-pay | Admitting: Pharmacy Technician

## 2022-03-29 ENCOUNTER — Other Ambulatory Visit (HOSPITAL_COMMUNITY): Payer: Self-pay

## 2022-03-29 NOTE — Telephone Encounter (Signed)
See note from pt that PA was denied and she is requesting an appeal

## 2022-03-29 NOTE — Telephone Encounter (Signed)
PA has been submitted as expedited, and telephone encounter has been created 

## 2022-03-29 NOTE — Telephone Encounter (Addendum)
Received a fax regarding Prior Authorization from [facility] for [place]. Authorization has been DENIED because per your health plan's criteria, this drug is covered if you meet the following: You have tried or cannot use both of the following: (A) An antispasmodic option (for example: dicyclomine). (B) An antidiarrheal option (for example: diphenoxylate-atropine)..  Phone# [phone]   Received notification from [employer] that prior authorization for VIBERZI '75MG'$  is required.   PA submitted on 1.19.24 Key [id number] Status is pending

## 2022-03-29 NOTE — Telephone Encounter (Signed)
Looks like you were working on this.

## 2022-04-11 ENCOUNTER — Other Ambulatory Visit: Payer: Self-pay | Admitting: Internal Medicine

## 2022-04-11 DIAGNOSIS — M0579 Rheumatoid arthritis with rheumatoid factor of multiple sites without organ or systems involvement: Secondary | ICD-10-CM

## 2022-04-11 NOTE — Telephone Encounter (Signed)
Next Visit: 07/03/2022  Last Visit: 12/31/2021  Last Fill: 03/22/2022   VT:[id number] arthritis involving both hands with positive rheumatoid factor   Current Dose per office note on 12/31/2021: Plan to continue Orencia 125 mg subcu weekly   Labs: 12/31/2021 Lab results all look fine her sedimentation rate is normal and no problems with continuing her current medications.   TB Gold: 12/31/2021 negative    Okay to refill orencia?

## 2022-04-17 NOTE — Telephone Encounter (Signed)
PA has been denied.  

## 2022-04-18 ENCOUNTER — Other Ambulatory Visit: Payer: Self-pay | Admitting: Internal Medicine

## 2022-04-18 ENCOUNTER — Other Ambulatory Visit (HOSPITAL_COMMUNITY): Payer: Self-pay

## 2022-04-18 DIAGNOSIS — K580 Irritable bowel syndrome with diarrhea: Secondary | ICD-10-CM

## 2022-04-18 MED ORDER — VIBERZI 75 MG PO TABS: 75.0000 mg | ORAL_TABLET | Freq: Two times a day (BID) | ORAL | 3 refills | Status: DC

## 2022-04-18 NOTE — Telephone Encounter (Signed)
Patient Advocate Encounter  Prior Authorization for VIBERZI '75MG'$  has been approved.    PA# [id number] Effective dates: 2.8.24 through 2.8.25   Received notification from [employer] that prior authorization for VIBERZI '75MG'$  is required.   PA submitted on 2.8.24 EXPEDITED Key [id number] Status is pending

## 2022-05-28 ENCOUNTER — Encounter: Payer: Self-pay | Admitting: Family Medicine

## 2022-06-24 ENCOUNTER — Encounter: Payer: Self-pay | Admitting: *Deleted

## 2022-06-24 ENCOUNTER — Other Ambulatory Visit: Payer: Self-pay | Admitting: Internal Medicine

## 2022-06-24 DIAGNOSIS — M0579 Rheumatoid arthritis with rheumatoid factor of multiple sites without organ or systems involvement: Secondary | ICD-10-CM

## 2022-06-24 NOTE — Telephone Encounter (Signed)
Last Fill: 04/11/2022  Labs: 12/31/2021 Lab results all look fine her sedimentation rate is normal and no problems with continuing her current medications.   TB Gold: 12/31/2021 Negative   Next Visit: 07/03/2022  Last Visit: 12/31/2021  DX: Rheumatoid arthritis involving both hands with positive rheumatoid factor   Current Dose per office note 12/31/2021: Orencia 125 mg subcu weekly   MyChart message sent to remind her to update labs every three months.   Okay to refill Orencia?

## 2022-06-26 ENCOUNTER — Other Ambulatory Visit: Payer: Self-pay | Admitting: Family Medicine

## 2022-06-26 DIAGNOSIS — Z1231 Encounter for screening mammogram for malignant neoplasm of breast: Secondary | ICD-10-CM

## 2022-07-03 ENCOUNTER — Encounter: Payer: Self-pay | Admitting: Internal Medicine

## 2022-07-03 ENCOUNTER — Ambulatory Visit: Payer: Commercial Managed Care - PPO | Attending: Internal Medicine | Admitting: Internal Medicine

## 2022-07-03 VITALS — BP 111/62 | HR 77 | Resp 14 | Ht 64.0 in | Wt 127.0 lb

## 2022-07-03 DIAGNOSIS — M05741 Rheumatoid arthritis with rheumatoid factor of right hand without organ or systems involvement: Secondary | ICD-10-CM

## 2022-07-03 DIAGNOSIS — Z79899 Other long term (current) drug therapy: Secondary | ICD-10-CM | POA: Diagnosis not present

## 2022-07-03 DIAGNOSIS — M349 Systemic sclerosis, unspecified: Secondary | ICD-10-CM | POA: Diagnosis not present

## 2022-07-03 DIAGNOSIS — K8689 Other specified diseases of pancreas: Secondary | ICD-10-CM

## 2022-07-03 DIAGNOSIS — M05742 Rheumatoid arthritis with rheumatoid factor of left hand without organ or systems involvement: Secondary | ICD-10-CM

## 2022-07-03 DIAGNOSIS — M0579 Rheumatoid arthritis with rheumatoid factor of multiple sites without organ or systems involvement: Secondary | ICD-10-CM

## 2022-07-03 MED ORDER — ORENCIA CLICKJECT 125 MG/ML ~~LOC~~ SOAJ
SUBCUTANEOUS | 5 refills | Status: DC
Start: 2022-07-03 — End: 2023-01-06

## 2022-07-03 MED ORDER — METHOTREXATE SODIUM 2.5 MG PO TABS: 20.0000 mg | ORAL_TABLET | ORAL | 0 refills | Status: DC

## 2022-07-03 MED ORDER — HYDROXYCHLOROQUINE SULFATE 200 MG PO TABS: 200.0000 mg | ORAL_TABLET | Freq: Every day | ORAL | 1 refills | Status: DC

## 2022-07-03 NOTE — Progress Notes (Signed)
Office Visit Note  Patient: Dawn Griffin             Date of Birth: 09/26/1970           MRN: [id number]             PCP: Agapito Games, MD Referring: Agapito Games, * Visit Date: 07/03/2022   Subjective:  Follow-up   History of Present Illness: Dawn Griffin is a 52 y.o. 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 weekly and folic acid 1 mg daily.  Overall she has been doing pretty well a few symptoms have acted up feels she is having some swelling in the right knee particularly first thing in the mornings that is more than previous.  Is also noticing some difficulty fully flexing the right index finger and occasionally gets a rubbing or popping sensation.  Has noticed new pitting lesions in the fingertips especially over the wintertime.   Previous HPI 12/31/21 Dawn Griffin is a 52 y.o. 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 weekly and folic acid 1 mg daily.  Overall has been doing fairly well since her last visit without major symptom exacerbations.  She was switched on medicines for the pancreatic insufficiency due to loss of efficacy on her previous treatment and is doing well again.  She had some basal cell cancers excised uneventfully.  Continues to have joint pain affecting the right shoulder low back and left hip has not seen peripheral joint swelling come back.  Raynaud's symptoms have been reasonably controlled she is taking the ADHD medications only as needed.     Previous HPI 06/27/2021 Dawn Griffin is a 52 y.o. female here for follow up for RA/CREST overlap on orencia 125 mg [place] weekly, HCQ 300 mg daily, and MTX 10 mg PO weekly.  Her RA has been doing well followed up with her doctors in [place] felt this is stable.  She saw Dr. Reche Dixon with no new concerning  findings on skin exam at that time recommended to continue medications including the methotrexate. She has increase in low back pain since around Christmas time she thinks this was some type of overuse or injury when carrying kids around.  Since this started she had severe pain with left leg symptoms with numbness and pain going down the leg and in the foot.  She had updated imaging of her low back in January and epidural shot performed was very helpful for the radicular symptoms.  She has scheduled upcoming appointments with orthopedic surgery and neurosurgery about more definitive management options for this problem.   Previous HPI 02/26/21 Dawn Griffin is a 52 y.o. female here for follow up for RA/CREST overlap on orencia 125 mg  weekly, HCQ 300 mg daily, MTX 10 mg PO weekly, and folic acid 1 mg daily. We decreased methotrexate dose after last visit due to elevation in liver function tests.  Overall she feels her arthritis and scleroderma symptoms are pretty well controlled.  She is experiencing increased verity of Raynaud's symptoms with some new digital pitting during the colder weather but is very proactive with coats gloves and hot hands heating packs.  Continuing to have good GI symptom improvement with the [place]on.  Overall she feels her mood and body aches are just overall worse and feels very down which she  states has come and gone periodically in previous months.     Previous HPI 05/25/20 Dawn Griffin is a 52 y.o. female with history of carotid artery aneurysm and centrilobular emphysema here for evaluation of rheumatoid arthritis currently on methotrexate and orencia infusion. 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 She was first evaluated with rheumatology around 2014 due to hand pain, stiffness, skin changes, raynaud's, telangiectasias but not started any long term treatments until in 2018 with worsening symptoms was diagnosed  with RA. She takes methotrexate and hydroxychloroquine for over a year without significant improvement in symptoms, mostly only feeling improvement when on low dose prednisone. Enbrel was added to her treatment but she does not feel this made a particularly good benefit in symptoms. She start Orencia infusion IV since about 2 months ago and also has not seen much improvement yet, although tolerating the treatment okay. She does not have much joint swelling most of the time, although in the past when she stopped all medicines for a time large bilateral knee swelling occurred and also hand swelling involving the whole hand and fingers. Doing okay today, currently on 10 mg prednisone she restarted taking it at 20 mg dose and decreased it feeling a bit worse but currently much better than off the medicine.   She had extensive cardiac workup in December with abnormal EKG and subsequent TTE without any obvious problems and cardiac CT checked with no coronary disease problems seen. She was also found to have carotid artery aneurysm during evaluation for vagal nerve stimulator placement as a participant with RESET-RA trial.   She has skin thickening and Raynaud's numerous telangiectasias on hands bilaterally.  She is not currently on any medicine for this.  She has a few areas of small pitting or scarring.  She previously did not try calcium channel blocker due to concern of side effects with a fairly low baseline blood pressure.  She has chronic diarrhea symptoms has had colonoscopy that was unremarkable.  She is also had swallowing study but does not describe prior upper GI endoscopy.   She has pulmonary emphysema but with good function on PFTs and is following up regularly with pulmonology.  This has been attributed to prior smoking history which she has to reduce but not stopped entirely.  She does have pretty frequent shortness of breath with exertion but no history of hypoxia or abnormal stress testing or PFTs.   She does not notice significant peripheral edema.   Labs reviewed 04/2016 ANA 1:1280 centromere RF 16.3 CCP 117    Review of Systems  Constitutional:  Positive for fatigue.  HENT:  Negative for mouth sores and mouth dryness.   Eyes:  Negative for dryness.  Respiratory:  Positive for shortness of breath.   Cardiovascular:  Positive for chest pain and palpitations.  Gastrointestinal:  Negative for blood in stool, constipation and diarrhea.  Endocrine: Negative for increased urination.  Genitourinary:  Negative for involuntary urination.  Musculoskeletal:  Positive for joint pain, joint pain, joint swelling and morning stiffness. Negative for gait problem, myalgias, muscle weakness, muscle tenderness and myalgias.  Skin:  Negative for color change, rash, hair loss and sensitivity to sunlight.  Allergic/Immunologic: Negative for susceptible to infections.  Neurological:  Negative for dizziness and headaches.  Hematological:  Negative for swollen glands.  Psychiatric/Behavioral:  Negative for depressed mood and sleep disturbance. The patient is not nervous/anxious.     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   Rheumatoid arthritis involving both hands with positive rheumatoid factor (HCC) 12/12/2020   Pancreatic insufficiency 10/10/2020   CREST syndrome (HCC) 07/06/2020   GERD (gastroesophageal reflux disease) 07/06/2020   High risk medication use 05/25/2020   Vitamin D deficiency 05/25/2020   S/P coil embolization of cerebral aneurysm 02/28/2020   Centrilobular emphysema (HCC) 08/04/2019   Chronic fatigue 05/10/2019   Brain fog 05/10/2019   Myofascial pain 06/30/2018   Rheumatoid arthritis (HCC) 05/20/2017   Bilateral hand numbness 08/17/2013    Arthritis of left carpometacarpal joint 02/26/2013   SOB (shortness of breath) 02/26/2013   ANEMIA, HX OF 01/10/2010   URINARY URGENCY 12/21/2009   LYMPHADENOPATHY 08/15/2008    Past Medical History:  Diagnosis Date   Aneurysm (HCC) 02/2020   Aortic atherosclerosis (HCC)    CREST (calcinosis, Raynaud's phenomenon, esophageal dysfunction, sclerodactyly, telangiectasia) (HCC) 2022   Emphysema of lung (HCC)    GERD (gastroesophageal reflux disease)    IBS (irritable bowel syndrome)    Irritable bowel syndrome with diarrhea    Pancreatic divisum    Raynaud disease    Rheumatoid arthritis (HCC)    Sessile colonic polyp     Family History  Problem Relation Age of Onset   Bipolar disorder Mother    Lung cancer Mother    Lung cancer Father    Gout Father    Healthy Sister    Lupus Sister    Healthy Brother    Arthritis/Rheumatoid Maternal Grandmother    Stomach cancer Paternal Grandmother    Colon cancer Neg Hx    Esophageal cancer Neg Hx    Colon polyps Neg Hx    Rectal cancer Neg Hx    Past Surgical History:  Procedure Laterality Date   ANEURYSM COILING  02/2020   FINGER ARTHROSCOPY WITH CARPOMETACARPEL ([facility]) ARTHROPLASTY Left 03/2019   LUMBAR SPINE SURGERY  2001   L4-5-S1   TONSILLECTOMY  1990   VAGUS NERVE STIMULATOR INSERTION  09/2019   Social History   Social History Narrative   No exercise. + daily caffeine.    Immunization History  Administered Date(s) Administered   Influenza Whole 01/10/2010   Tdap 01/11/2013     Objective: Vital Signs: BP 111/62 (BP Location: Left Arm, Patient Position: Sitting, Cuff Size: Normal)   Pulse 77   Resp 14   Ht 5\' 4"  (1.626 m)   Wt 127 lb (57.6 kg)   BMI 21.80 kg/m    Physical Exam Eyes:     Conjunctiva/sclera: Conjunctivae normal.  Cardiovascular:     Rate and Rhythm: Normal rate and regular rhythm.  Pulmonary:     Effort: Pulmonary effort is normal.     Breath sounds: Normal breath sounds.  Lymphadenopathy:      Cervical: No cervical adenopathy.  Skin:    General: Skin is warm and dry.     Comments: Numerous telangiectasias especially on fingers Skin hardening throughout on both hands Longitudinal nail ridges without nail pitting Small scab or pits on tip of right index finger  Neurological:     Mental Status: She is alert.  Psychiatric:        Mood and Affect: Mood normal.      Musculoskeletal Exam:  Neck full ROM no tenderness Shoulders full ROM no tenderness  or swelling Elbows full ROM no tenderness or swelling Wrists full ROM no tenderness or swelling Fingers full ROM no tenderness or swelling, right index finger with friction rub Hip normal internal and external rotation without pain, no tenderness to lateral hip palpation Knees full ROM no tenderness or swelling, crepitus present b/l   Investigation: No additional findings.  Imaging: No results found.  Recent Labs: Lab Results  Component Value Date   WBC 9.9 07/03/2022   HGB 13.4 07/03/2022   PLT 218 07/03/2022   NA 139 07/03/2022   K 4.5 07/03/2022   CL 105 07/03/2022   CO2 28 07/03/2022   GLUCOSE 91 07/03/2022   BUN 12 07/03/2022   CREATININE 0.78 07/03/2022   BILITOT 0.4 07/03/2022   ALKPHOS 38 03/18/2016   AST 21 07/03/2022   ALT 32 (H) 07/03/2022   PROT 6.1 07/03/2022   ALBUMIN 4.0 03/18/2016   CALCIUM 9.5 07/03/2022   GFRAA 102 09/06/2020   QFTBGOLDPLUS NEGATIVE 12/31/2021    Speciality Comments: Patient is scheduled for PLQ eye exam: 10/06/2020  Procedures:  No procedures performed Allergies: Patient has no known allergies.   Assessment / Plan:     Visit Diagnoses: Rheumatoid arthritis involving both hands with positive rheumatoid factor (HCC) - Plan: Sedimentation rate, C-reactive protein, hydroxychloroquine (PLAQUENIL) 200 MG tablet, methotrexate (RHEUMATREX) 2.5 MG tablet  11 arthritis appears well-controlled.  Will check sed rate and CRP for disease activity monitoring.  Plan to continue the  Orencia 125 mg subcu weekly, hydroxychloroquine 200 mg daily, methotrexate 10 mg weekly, folic acid 1 mg daily.  Pancreatic insufficiency  GI-still doing pretty well with adjustment to her pancreatic enzyme supplementation.  Limited scleroderma (HCC)  Continue to have Raynaud's symptoms does have some evidence of minor digital ischemia with few pits and scabbed spots.  Right index finger exam appears suggestive for a tendon friction rub.  High risk medication use - Plan: CBC with Differential/Platelet, COMPLETE METABOLIC PANEL WITH GFR, Ambulatory referral to Ophthalmology  Checking CBC and CMP for medication monitoring on continued use of Orencia and hydroxychloroquine and methotrexate.  Has not had significant interval infections.  Still needs to see ophthalmology for retinal toxicity screening exam previous was delayed seems to be at least in part from insurance change.  Orders: Orders Placed This Encounter  Procedures   Sedimentation rate   C-reactive protein   CBC with Differential/Platelet   COMPLETE METABOLIC PANEL WITH GFR   Ambulatory referral to Ophthalmology   Meds ordered this encounter  Medications   hydroxychloroquine (PLAQUENIL) 200 MG tablet    Sig: Take 1 tablet (200 mg total) by mouth daily.    Dispense:  90 tablet    Refill:  1   methotrexate (RHEUMATREX) 2.5 MG tablet    Sig: Take 8 tablets (20 mg total) by mouth once a week. Caution:Chemotherapy. Protect from light.    Dispense:  104 tablet    Refill:  0   Abatacept (ORENCIA CLICKJECT) 125 MG/ML SOAJ    Sig: INJECT 125MG  SUBCUTANEOUSLY ONCE WEEKLY    Dispense:  4 mL    Refill:  5     Follow-Up Instructions: No follow-ups on file.   Fuller Plan, MD  Note - This record has been created using [employer].  Chart creation errors have been sought, but may not always  have been located. Such creation errors do not reflect on  the standard of medical care.

## 2022-07-04 LAB — COMPLETE METABOLIC PANEL WITH GFR
AG Ratio: 2.2 (calc) (ref 1.0–2.5)
ALT: 32 U/L — ABNORMAL HIGH (ref 6–29)
AST: 21 U/L (ref 10–35)
Albumin: 4.2 g/dL (ref 3.6–5.1)
Alkaline phosphatase (APISO): 40 U/L (ref 31–125)
BUN: 12 mg/dL (ref 7–25)
CO2: 28 mmol/L (ref 20–32)
Calcium: 9.5 mg/dL (ref 8.6–10.2)
Chloride: 105 mmol/L (ref 98–110)
Creat: 0.78 mg/dL (ref 0.50–0.99)
Globulin: 1.9 g/dL (calc) (ref 1.9–3.7)
Glucose, Bld: 91 mg/dL (ref 65–99)
Potassium: 4.5 mmol/L (ref 3.5–5.3)
Sodium: 139 mmol/L (ref 135–146)
Total Bilirubin: 0.4 mg/dL (ref 0.2–1.2)
Total Protein: 6.1 g/dL (ref 6.1–8.1)
eGFR: 93 mL/min/{1.73_m2} (ref >=60)

## 2022-07-04 LAB — CBC WITH DIFFERENTIAL/PLATELET
Absolute Monocytes: 683 cells/uL (ref 200–950)
Basophils Absolute: 40 cells/uL (ref 0–200)
Basophils Relative: 0.4 %
Eosinophils Absolute: 208 cells/uL (ref 15–500)
Eosinophils Relative: 2.1 %
HCT: 40.5 % (ref 35.0–45.0)
Hemoglobin: 13.4 g/dL (ref 11.7–15.5)
Lymphs Abs: 3435 cells/uL (ref 850–3900)
MCH: 31.9 pg (ref 27.0–33.0)
MCHC: 33.1 g/dL (ref 32.0–36.0)
MCV: 96.4 fL (ref 80.0–100.0)
MPV: 11 fL (ref 7.5–12.5)
Monocytes Relative: 6.9 %
Neutro Abs: 5534 cells/uL (ref 1500–7800)
Neutrophils Relative %: 55.9 %
Platelets: 218 10*3/uL (ref 140–400)
RBC: 4.2 10*6/uL (ref 3.80–5.10)
RDW: 12.4 % (ref 11.0–15.0)
Total Lymphocyte: 34.7 %
WBC: 9.9 10*3/uL (ref 3.8–10.8)

## 2022-07-04 LAB — SEDIMENTATION RATE: Sed Rate: 2 mm/h (ref 0–20)

## 2022-07-04 LAB — C-REACTIVE PROTEIN: CRP: <3 mg/L (ref <8.0)

## 2022-08-12 ENCOUNTER — Ambulatory Visit
Admission: RE | Admit: 2022-08-12 | Discharge: 2022-08-12 | Disposition: A | Discharge status: Home / Self Care | Payer: Commercial Managed Care - PPO | Source: Ambulatory Visit | Attending: Family Medicine | Admitting: Family Medicine

## 2022-08-12 ENCOUNTER — Other Ambulatory Visit: Payer: Self-pay | Admitting: Family Medicine

## 2022-08-12 DIAGNOSIS — Z1231 Encounter for screening mammogram for malignant neoplasm of breast: Secondary | ICD-10-CM

## 2022-08-12 DIAGNOSIS — M47816 Spondylosis without myelopathy or radiculopathy, lumbar region: Secondary | ICD-10-CM

## 2022-08-13 NOTE — Progress Notes (Signed)
Please call patient. Normal mammogram.  Repeat in 1 year.  

## 2022-09-28 IMAGING — US US BREAST*L* LIMITED INC AXILLA
1 series · 5 of 5 positions shown · non-contrast
Comparison: 06/15/2019

CLINICAL DATA: First six-month follow-up for palpable mass in the
LEFT breast. Patient reports no interval changes on her exam.

EXAM:
ULTRASOUND OF THE LEFT BREAST

[Series 1: us breast*left* limited inc axilla · 0.06mm/px · 5 of 5 slices shown]
[im 1/5]
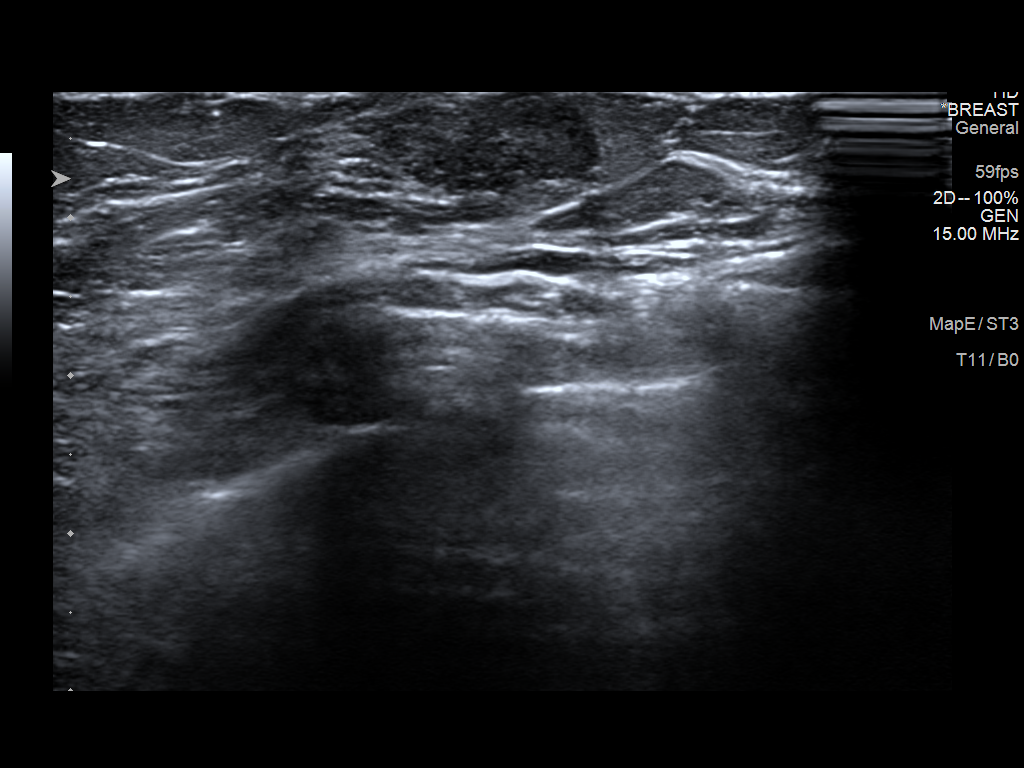
[im 2/5]
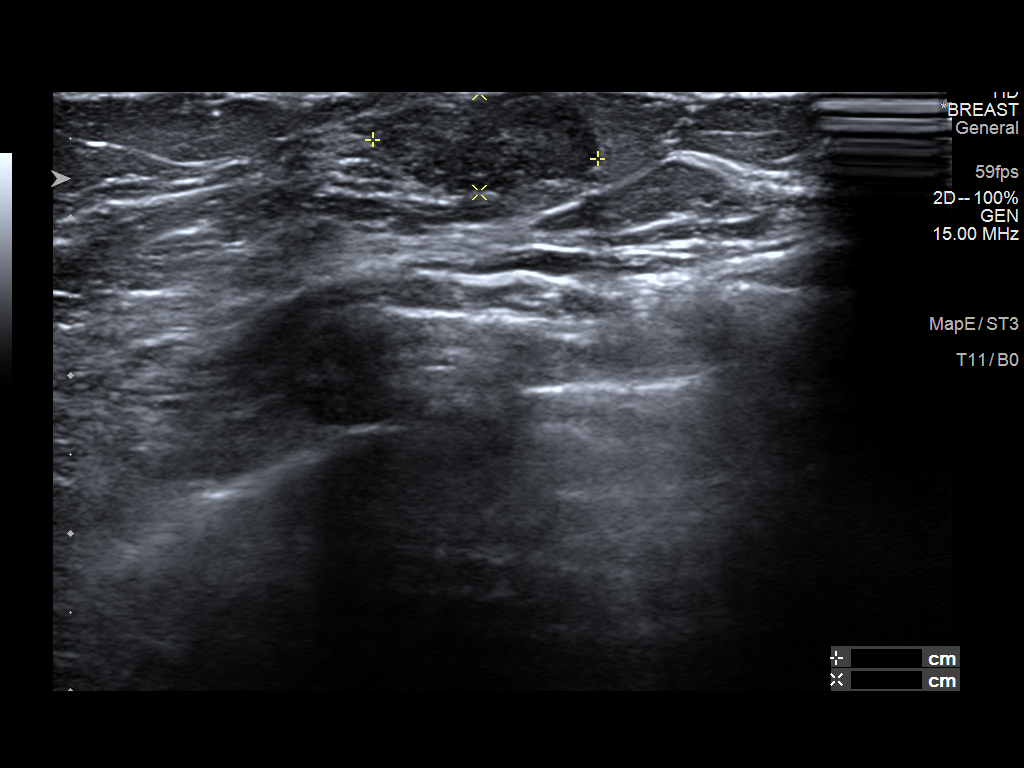
[im 3/5]
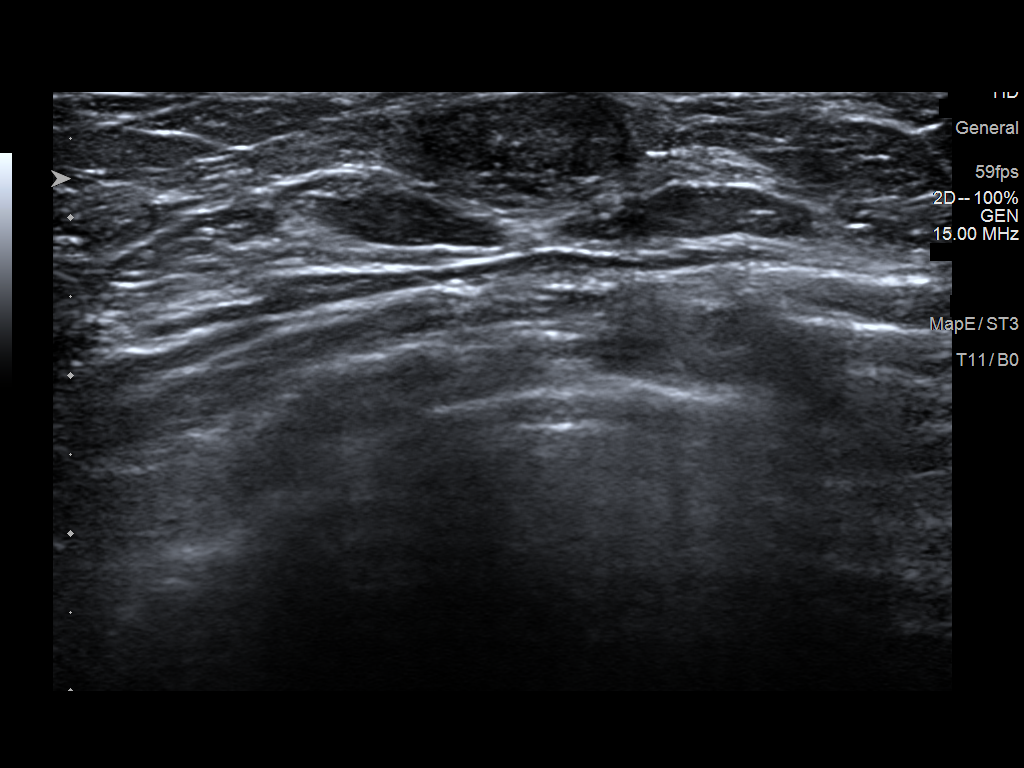
[im 4/5]
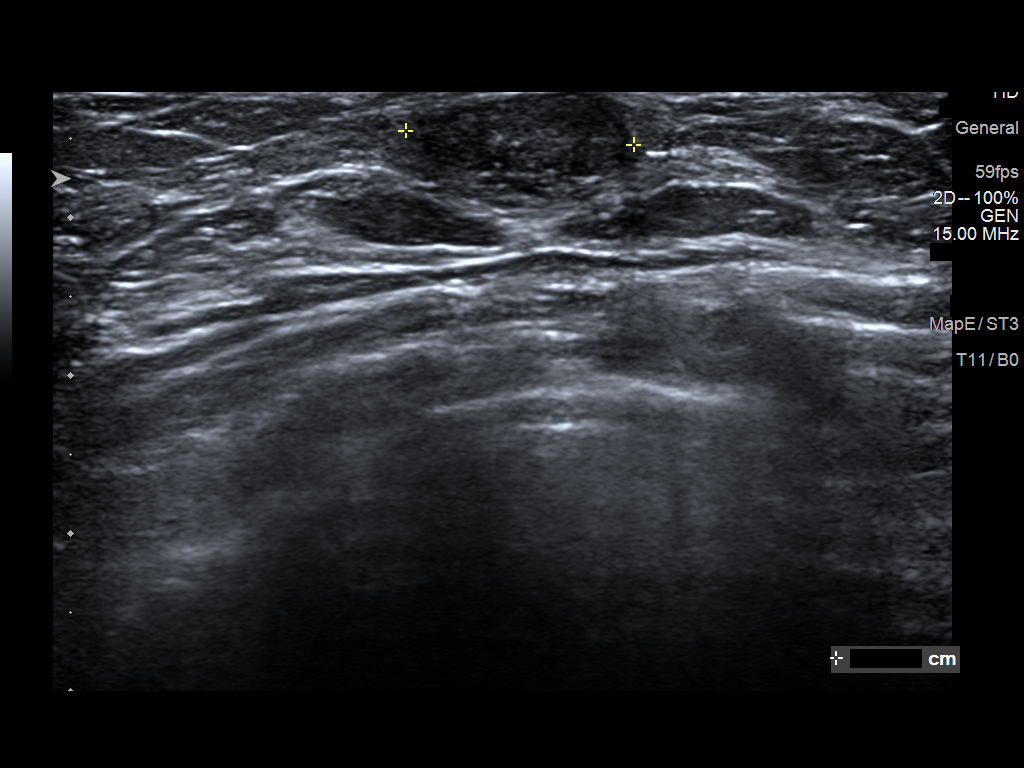
[im 5/5]
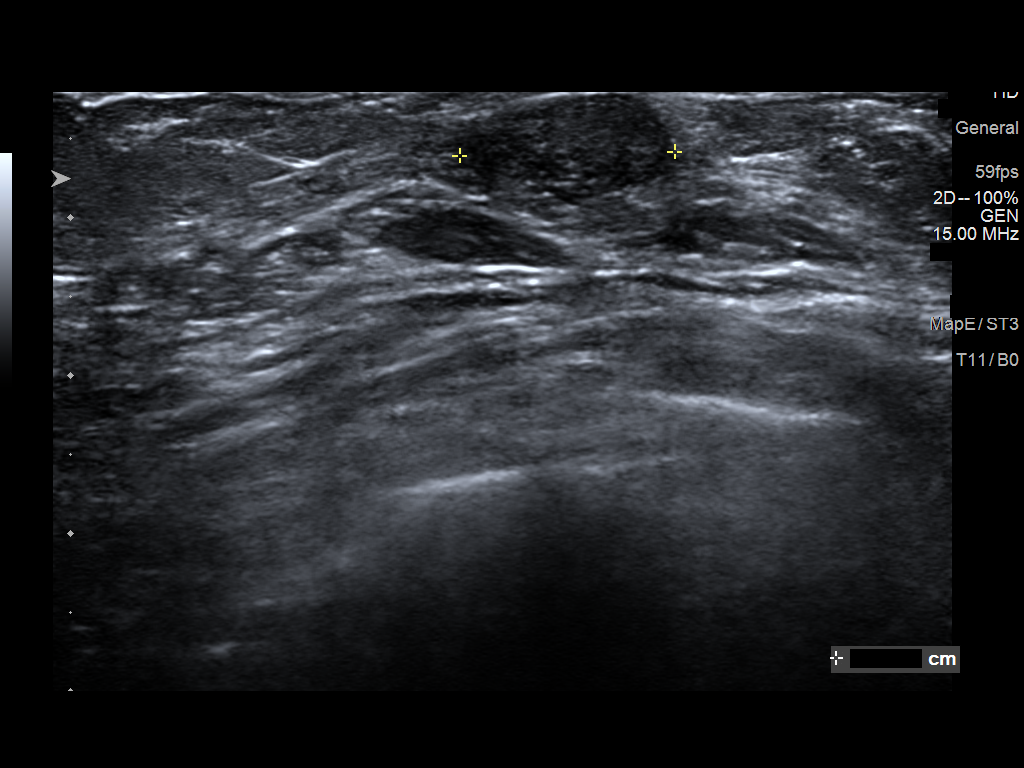

[5 of 5 positions shown; findings below may reference images not displayed]

FINDINGS: On physical exam, I palpate a rounded mobile mass in the LOWER OUTER
QUADRANT of the LEFT breast.

Targeted ultrasound is performed, showing a circumscribed oval
hypoechoic mass with posterior acoustic enhancement in the 5 o'clock
location 4 centimeters from the LEFT nipple. Mass is 1.4 x 0.6 x
centimeters, previously 1.3 x 0.671.0 centimeters.
IMPRESSION: Similar appearance of probable fibroadenoma in the LEFT breast.
There is no sonographic evidence for malignancy.

RECOMMENDATION:
Recommend bilateral diagnostic mammogram and LEFT breast ultrasound
in 6 months.

I have discussed the findings and recommendations with the patient.
If applicable, a reminder letter will be sent to the patient
regarding the next appointment.

BI-RADS CATEGORY  3: Probably benign.

## 2022-09-30 ENCOUNTER — Telehealth: Payer: Self-pay

## 2022-09-30 NOTE — Telephone Encounter (Signed)
Received a fax from [facility] stating they have been trying to get in contact with the patient about refilling her Orencia. Attempted to contact the patient and left a message to advise the patient to call [facility] back at [phone].

## 2022-09-30 NOTE — Progress Notes (Unsigned)
Office Visit Note  Patient: Dawn Griffin             Date of Birth: 03/07/1971           MRN: [id number]             PCP: Agapito Games, MD Referring: Agapito Games, * Visit Date: 10/02/2022   Subjective:  No chief complaint on file.   History of Present Illness: Dawn Griffin is a 52 y.o. 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 weekly and folic acid 1 mg daily.    Previous HPI 07/03/2022 Dawn Griffin is a 52 y.o. 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 weekly and folic acid 1 mg daily.  Overall she has been doing pretty well a few symptoms have acted up feels she is having some swelling in the right knee particularly first thing in the mornings that is more than previous.  Is also noticing some difficulty fully flexing the right index finger and occasionally gets a rubbing or popping sensation.  Has noticed new pitting lesions in the fingertips especially over the wintertime.     Previous HPI 12/31/21 Dawn Griffin is a 52 y.o. 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 weekly and folic acid 1 mg daily.  Overall has been doing fairly well since her last visit without major symptom exacerbations.  She was switched on medicines for the pancreatic insufficiency due to loss of efficacy on her previous treatment and is doing well again.  She had some basal cell cancers excised uneventfully.  Continues to have joint pain affecting the right shoulder low back and left hip has not seen peripheral joint swelling come back.  Raynaud's symptoms have been reasonably controlled she is taking the ADHD medications only as needed.     Previous HPI 06/27/2021 Dawn Griffin is a 52 y.o. female  here for follow up for RA/CREST overlap on orencia 125 mg [place] weekly, HCQ 300 mg daily, and MTX 10 mg PO weekly.  Her RA has been doing well followed up with her doctors in [place] felt this is stable.  She saw Dr. Reche Dixon with no new concerning findings on skin exam at that time recommended to continue medications including the methotrexate. She has increase in low back pain since around Christmas time she thinks this was some type of overuse or injury when carrying kids around.  Since this started she had severe pain with left leg symptoms with numbness and pain going down the leg and in the foot.  She had updated imaging of her low back in January and epidural shot performed was very helpful for the radicular symptoms.  She has scheduled upcoming appointments with orthopedic surgery and neurosurgery about more definitive management options for this problem.   Previous HPI 02/26/21 Dawn Griffin is a 52 y.o. female here for follow up for RA/CREST overlap on orencia 125 mg [place] weekly, HCQ 300 mg daily, MTX 10 mg PO weekly, and folic acid 1 mg daily. We decreased methotrexate dose after last visit due to elevation in liver function tests.  Overall she feels her arthritis and scleroderma symptoms are pretty well controlled.  She is experiencing increased verity of Raynaud's symptoms with some new  digital pitting during the colder weather but is very proactive with coats gloves and hot hands heating packs.  Continuing to have good GI symptom improvement with the Creon.  Overall she feels her mood and body aches are just overall worse and feels very down which she states has come and gone periodically in previous months.     Previous HPI 05/25/20 Dawn Griffin is a 52 y.o. female with history of carotid artery aneurysm and centrilobular emphysema here for evaluation of rheumatoid arthritis currently on methotrexate and orencia infusion. 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 She was first evaluated with rheumatology around 2014 due to hand pain, stiffness, skin changes, raynaud's, telangiectasias but not started any long term treatments until in 2018 with worsening symptoms was diagnosed with RA. She takes methotrexate and hydroxychloroquine for over a year without significant improvement in symptoms, mostly only feeling improvement when on low dose prednisone. Enbrel was added to her treatment but she does not feel this made a particularly good benefit in symptoms. She start Orencia infusion IV since about 2 months ago and also has not seen much improvement yet, although tolerating the treatment okay. She does not have much joint swelling most of the time, although in the past when she stopped all medicines for a time large bilateral knee swelling occurred and also hand swelling involving the whole hand and fingers. Doing okay today, currently on 10 mg prednisone she restarted taking it at 20 mg dose and decreased it feeling a bit worse but currently much better than off the medicine.   She had extensive cardiac workup in December with abnormal EKG and subsequent TTE without any obvious problems and cardiac CT checked with no coronary disease problems seen. She was also found to have carotid artery aneurysm during evaluation for vagal nerve stimulator placement as a participant with RESET-RA trial.   She has skin thickening and Raynaud's numerous telangiectasias on hands bilaterally.  She is not currently on any medicine for this.  She has a few areas of small pitting or scarring.  She previously did not try calcium channel blocker due to concern of side effects with a fairly low baseline blood pressure.  She has chronic diarrhea symptoms has had colonoscopy that was unremarkable.  She is also had swallowing study but does not describe prior upper GI endoscopy.   She has pulmonary emphysema but with good function on PFTs and is following up  regularly with pulmonology.  This has been attributed to prior smoking history which she has to reduce but not stopped entirely.  She does have pretty frequent shortness of breath with exertion but no history of hypoxia or abnormal stress testing or PFTs.  She does not notice significant peripheral edema.   Labs reviewed 04/2016 ANA 1:1280 centromere RF 16.3 CCP 117   No Rheumatology ROS completed.   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   GERD (gastroesophageal reflux disease) 07/06/2020   High risk medication use 05/25/2020   Vitamin D deficiency 05/25/2020   S/P coil embolization of cerebral aneurysm 02/28/2020  Centrilobular emphysema (HCC) 08/04/2019   Chronic fatigue 05/10/2019   Brain fog 05/10/2019   Myofascial pain 06/30/2018   Rheumatoid arthritis (HCC) 05/20/2017   Bilateral hand numbness 08/17/2013   Arthritis of left carpometacarpal joint 02/26/2013   SOB (shortness of breath) 02/26/2013   ANEMIA, HX OF 01/10/2010   URINARY URGENCY 12/21/2009   LYMPHADENOPATHY 08/15/2008    Past Medical History:  Diagnosis Date   Aneurysm (HCC) 02/2020   Aortic atherosclerosis (HCC)    CREST (calcinosis, Raynaud's phenomenon, esophageal dysfunction, sclerodactyly, telangiectasia) (HCC) 2022   Emphysema of lung (HCC)    GERD (gastroesophageal reflux disease)    IBS (irritable bowel syndrome)    Irritable bowel syndrome with diarrhea    Pancreatic divisum    Raynaud disease    Rheumatoid arthritis (HCC)    Sessile colonic polyp     Family History  Problem Relation Age of Onset    Bipolar disorder Mother    Lung cancer Mother    Lung cancer Father    Gout Father    Healthy Sister    Lupus Sister    Arthritis/Rheumatoid Maternal Grandmother    Stomach cancer Paternal Grandmother    Healthy Brother    Colon cancer Neg Hx    Esophageal cancer Neg Hx    Colon polyps Neg Hx    Rectal cancer Neg Hx    Breast cancer Neg Hx    Past Surgical History:  Procedure Laterality Date   ANEURYSM COILING  02/2020   FINGER ARTHROSCOPY WITH CARPOMETACARPEL ([facility]) ARTHROPLASTY Left 03/2019   LUMBAR SPINE SURGERY  2001   L4-5-S1   TONSILLECTOMY  1990   VAGUS NERVE STIMULATOR INSERTION  09/2019   Social History   Social History Narrative   No exercise. + daily caffeine.    Immunization History  Administered Date(s) Administered   Influenza Whole 01/10/2010   Tdap 01/11/2013     Objective: Vital Signs: There were no vitals taken for this visit.   Physical Exam   Musculoskeletal Exam: ***  CDAI Exam: CDAI Score: -- Patient Global: --; Provider Global: -- Swollen: --; Tender: -- Joint Exam 10/02/2022   No joint exam has been documented for this visit   There is currently no information documented on the homunculus. Go to the Rheumatology activity and complete the homunculus joint exam.  Investigation: No additional findings.  Imaging: No results found.  Recent Labs: Lab Results  Component Value Date   WBC 9.9 07/03/2022   HGB 13.4 07/03/2022   PLT 218 07/03/2022   NA 139 07/03/2022   K 4.5 07/03/2022   CL 105 07/03/2022   CO2 28 07/03/2022   GLUCOSE 91 07/03/2022   BUN 12 07/03/2022   CREATININE 0.78 07/03/2022   BILITOT 0.4 07/03/2022   ALKPHOS 38 03/18/2016   AST 21 07/03/2022   ALT 32 (H) 07/03/2022   PROT 6.1 07/03/2022   ALBUMIN 4.0 03/18/2016   CALCIUM 9.5 07/03/2022   GFRAA 102 09/06/2020   QFTBGOLDPLUS NEGATIVE 12/31/2021    Speciality Comments: PLQ Eye Exam 08/20/2022 normal [place] f/u 1 year  Procedures:  No procedures  performed Allergies: Patient has no known allergies.   Assessment / Plan:     Visit Diagnoses: No diagnosis found.  ***  Orders: No orders of the defined types were placed in this encounter.  No orders of the defined types were placed in this encounter.    Follow-Up Instructions: No follow-ups on file.   Metta Clines, RT  Note -  This record has been created using [employer].  Chart creation errors have been sought, but may not always  have been located. Such creation errors do not reflect on  the standard of medical care.

## 2022-10-01 ENCOUNTER — Telehealth: Payer: Self-pay | Admitting: Pharmacist

## 2022-10-01 NOTE — Telephone Encounter (Signed)
Submitted a Prior Authorization renewal request to [facility] for [facility] SQ via CoverMyMeds. Will update once we receive a response.  Key: [id number]  Chesley Mires, PharmD, MPH, BCPS, CPP Clinical Pharmacist (Rheumatology and Pulmonology)

## 2022-10-01 NOTE — Telephone Encounter (Signed)
Received notification from [facility] regarding a prior authorization for [facility]. Authorization has been APPROVED from 10/01/22 to 10/01/23. Approval letter sent to scan center.  Patient must continue to fill through [facility]: [phone]   Authorization # [id number]  Chesley Mires, PharmD, MPH, BCPS, CPP Clinical Pharmacist (Rheumatology and Pulmonology)

## 2022-10-02 ENCOUNTER — Ambulatory Visit: Payer: Commercial Managed Care - PPO | Attending: Internal Medicine | Admitting: Internal Medicine

## 2022-10-02 ENCOUNTER — Encounter: Payer: Self-pay | Admitting: Internal Medicine

## 2022-10-02 VITALS — BP 105/65 | HR 76 | Resp 12 | Ht 64.0 in | Wt 125.0 lb

## 2022-10-02 DIAGNOSIS — K8689 Other specified diseases of pancreas: Secondary | ICD-10-CM | POA: Diagnosis not present

## 2022-10-02 DIAGNOSIS — Z9889 Other specified postprocedural states: Secondary | ICD-10-CM

## 2022-10-02 DIAGNOSIS — M05741 Rheumatoid arthritis with rheumatoid factor of right hand without organ or systems involvement: Secondary | ICD-10-CM

## 2022-10-02 DIAGNOSIS — Z79899 Other long term (current) drug therapy: Secondary | ICD-10-CM

## 2022-10-02 DIAGNOSIS — M05742 Rheumatoid arthritis with rheumatoid factor of left hand without organ or systems involvement: Secondary | ICD-10-CM

## 2022-10-02 DIAGNOSIS — R0602 Shortness of breath: Secondary | ICD-10-CM

## 2022-10-02 DIAGNOSIS — M349 Systemic sclerosis, unspecified: Secondary | ICD-10-CM | POA: Diagnosis not present

## 2022-10-02 LAB — CBC WITH DIFFERENTIAL/PLATELET
Absolute Monocytes: 178 cells/uL — ABNORMAL LOW (ref 200–950)
Basophils Absolute: 44 cells/uL (ref 0–200)
Eosinophils Absolute: 15 cells/uL (ref 15–500)
Eosinophils Relative: 0.1 %
Hemoglobin: 14 g/dL (ref 11.7–15.5)
MCV: 96.4 fL (ref 80.0–100.0)
MPV: 9.9 fL (ref 7.5–12.5)
Monocytes Relative: 1.2 %
Neutro Abs: 12521 cells/uL — ABNORMAL HIGH (ref 1500–7800)
Neutrophils Relative %: 84.6 %
RDW: 12.8 % (ref 11.0–15.0)
Total Lymphocyte: 13.8 %
WBC: 14.8 10*3/uL — ABNORMAL HIGH (ref 3.8–10.8)

## 2022-10-02 MED ORDER — METHOTREXATE SODIUM 2.5 MG PO TABS: 10.0000 mg | ORAL_TABLET | ORAL | Status: DC

## 2022-10-03 LAB — COMPLETE METABOLIC PANEL WITH GFR
AG Ratio: 2 (calc) (ref 1.0–2.5)
AST: 19 U/L (ref 10–35)
Albumin: 4.7 g/dL (ref 3.6–5.1)
Alkaline phosphatase (APISO): 39 U/L (ref 31–125)
BUN: 12 mg/dL (ref 7–25)
CO2: 28 mmol/L (ref 20–32)
Calcium: 10.2 mg/dL (ref 8.6–10.2)
Chloride: 102 mmol/L (ref 98–110)
Creat: 0.77 mg/dL (ref 0.50–0.99)
Globulin: 2.3 g/dL (calc) (ref 1.9–3.7)
Glucose, Bld: 120 mg/dL — ABNORMAL HIGH (ref 65–99)
Potassium: 4.8 mmol/L (ref 3.5–5.3)
Sodium: 138 mmol/L (ref 135–146)
Total Bilirubin: 0.3 mg/dL (ref 0.2–1.2)
Total Protein: 7 g/dL (ref 6.1–8.1)

## 2022-10-03 LAB — SEDIMENTATION RATE: Sed Rate: 2 mm/h (ref 0–20)

## 2022-10-03 LAB — CBC WITH DIFFERENTIAL/PLATELET
Basophils Relative: 0.3 %
HCT: 42.3 % (ref 35.0–45.0)
Lymphs Abs: 2042 cells/uL (ref 850–3900)
MCH: 31.9 pg (ref 27.0–33.0)
MCHC: 33.1 g/dL (ref 32.0–36.0)
Platelets: 326 10*3/uL (ref 140–400)
RBC: 4.39 10*6/uL (ref 3.80–5.10)

## 2022-10-11 ENCOUNTER — Ambulatory Visit: Payer: Commercial Managed Care - PPO | Admitting: Sports Medicine

## 2022-10-11 DIAGNOSIS — R200 Anesthesia of skin: Secondary | ICD-10-CM | POA: Diagnosis not present

## 2022-10-11 MED ORDER — GABAPENTIN 300 MG PO CAPS
300.0000 mg | ORAL_CAPSULE | Freq: Every day | ORAL | 3 refills | Status: DC
Start: 2022-10-11 — End: 2023-03-27

## 2022-10-11 NOTE — Progress Notes (Addendum)
    Procedures performed today:    None.  Independent interpretation of notes and tests performed by another provider:   None.  Brief History, Exam, Impression, and Recommendations:    Bilateral carpal tunnel syndrome, left cubital tunnel syndrome This is an exquisitely pleasant 52 year old female, she has had about 2 weeks of increasing numbness left hand ulnar nerve distribution from the elbow down. No neck pain. No trauma. She has also developed some progressive weakness. On exam she has a positive cubital tunnel Tinel's sign, she has significant weakness to digital adduction. No visible atrophy. Due to severity of symptomatology we will proceed with nerve conduction and EMG, nighttime splinting, Neurontin, home PT. I would also like consultation with hand surgery considering her progressive weakness.  Update: EMG/nerve conduction study reviewed, it does show severe slowing of the ulnar nerve at the elbow consistent with cubital tunnel syndrome.  There was also mild carpal tunnel syndrome.    ____________________________________________ Ihor Austin. Benjamin Stain, M.D., ABFM., CAQSM., AME. Primary Care and Sports Medicine [facility]  Adjunct Professor of Family Medicine  [place] [employer]  Restaurant manager, fast food

## 2022-10-11 NOTE — Assessment & Plan Note (Addendum)
This is an exquisitely pleasant 52 year old female, she has had about 2 weeks of increasing numbness left hand ulnar nerve distribution from the elbow down. No neck pain. No trauma. She has also developed some progressive weakness. On exam she has a positive cubital tunnel Tinel's sign, she has significant weakness to digital adduction. No visible atrophy. Due to severity of symptomatology we will proceed with nerve conduction and EMG, nighttime splinting, Neurontin, home PT. I would also like consultation with hand surgery considering her progressive weakness.  Update: EMG/nerve conduction study reviewed, it does show severe slowing of the ulnar nerve at the elbow consistent with cubital tunnel syndrome.  There was also mild carpal tunnel syndrome.

## 2022-10-14 ENCOUNTER — Encounter: Payer: Self-pay | Admitting: Sports Medicine

## 2022-10-14 ENCOUNTER — Other Ambulatory Visit: Payer: Self-pay

## 2022-10-14 ENCOUNTER — Encounter: Payer: Self-pay | Admitting: Internal Medicine

## 2022-10-14 ENCOUNTER — Encounter: Payer: Self-pay | Admitting: Neurology

## 2022-10-14 DIAGNOSIS — R202 Paresthesia of skin: Secondary | ICD-10-CM

## 2022-10-17 ENCOUNTER — Ambulatory Visit: Payer: Commercial Managed Care - PPO | Admitting: Sports Medicine

## 2022-10-17 IMAGING — US US CAROTID DUPLEX BILAT
1 series · 13 of 24 positions shown · non-contrast
Comparison: None.

CLINICAL DATA: 49-year-old female with tinnitus

EXAM:
BILATERAL CAROTID DUPLEX ULTRASOUND
TECHNIQUE: Gray scale imaging, color Doppler and duplex ultrasound were
performed of bilateral carotid and vertebral arteries in the neck.

[Series 1: us carotid duplex bilat · 0.06mm/px · 13 of 56 slices shown]
[im 1/56]
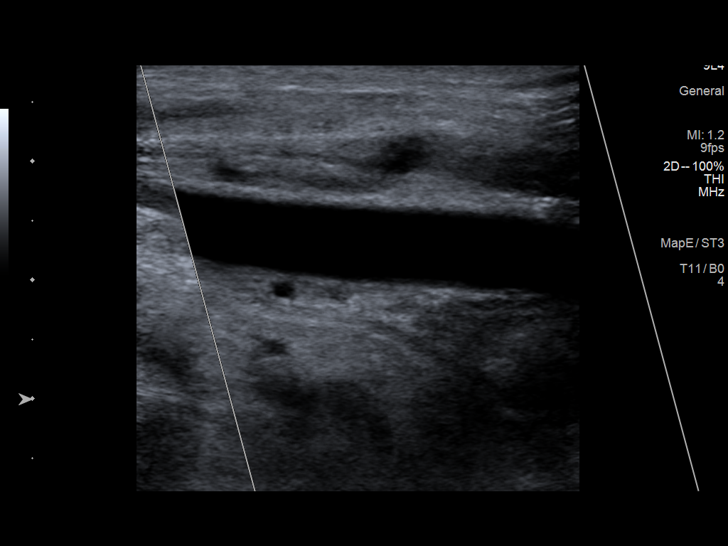
[im 5/56]
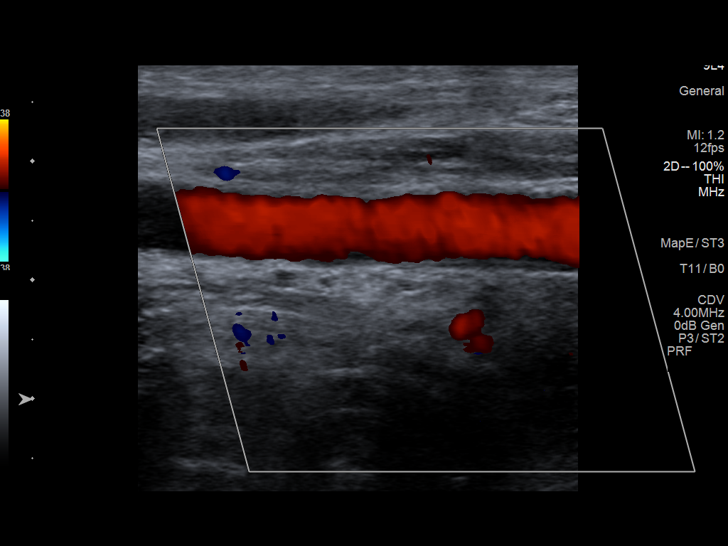
[im 10/56]
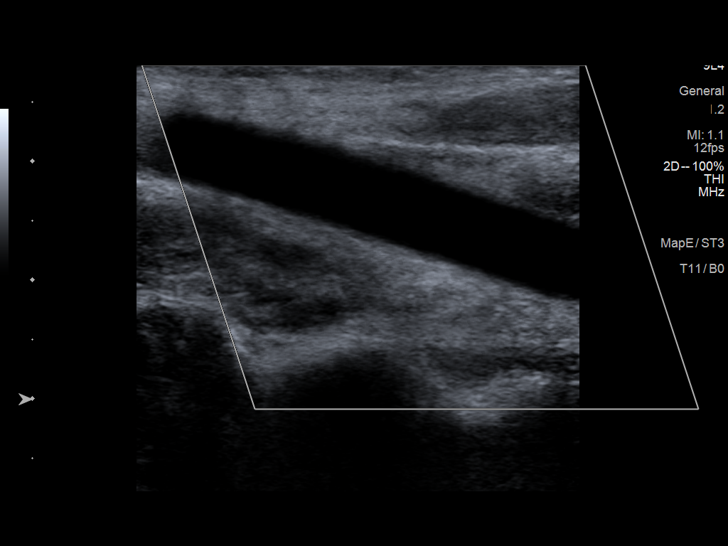
[im 15/56]
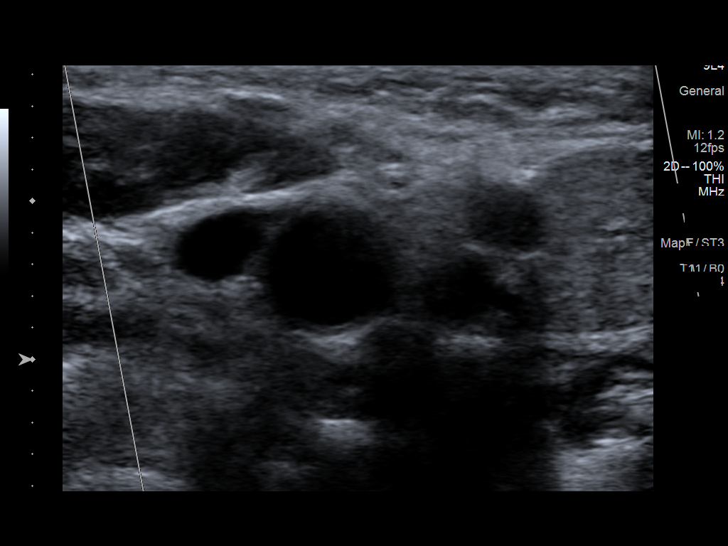
[im 20/56]
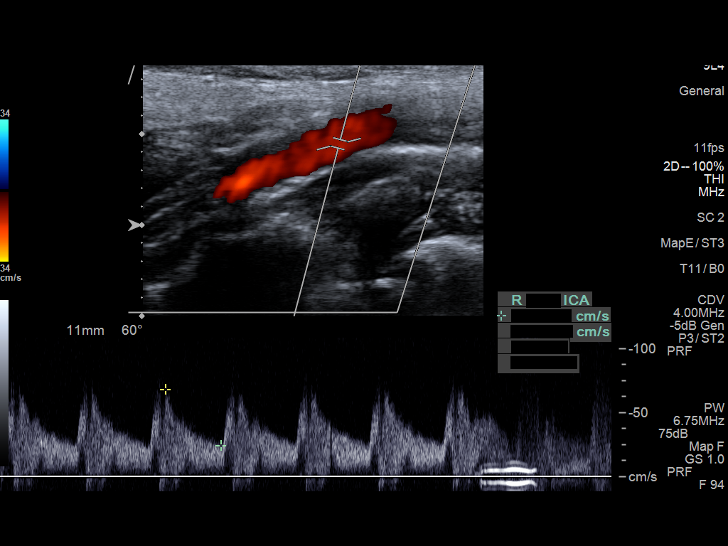
[im 24/56]
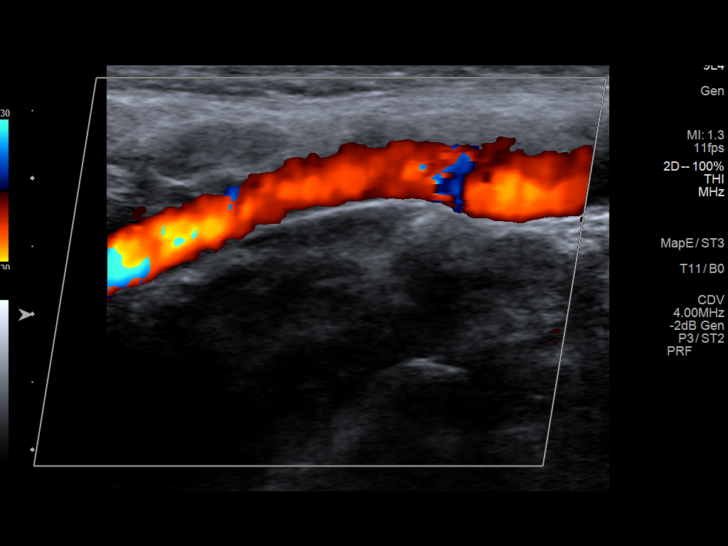
[im 29/56]
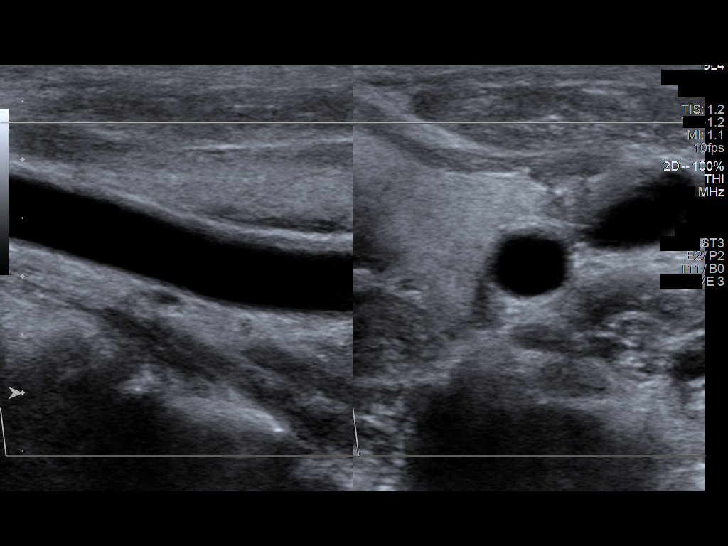
[im 32/56]
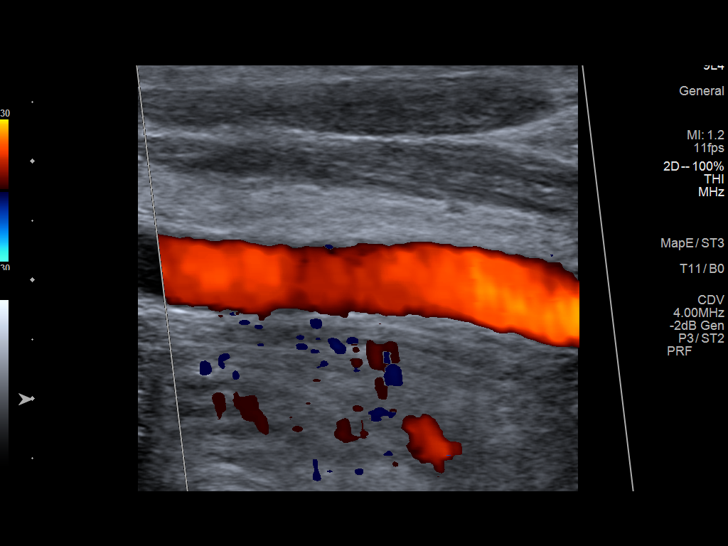
[im 36/56]
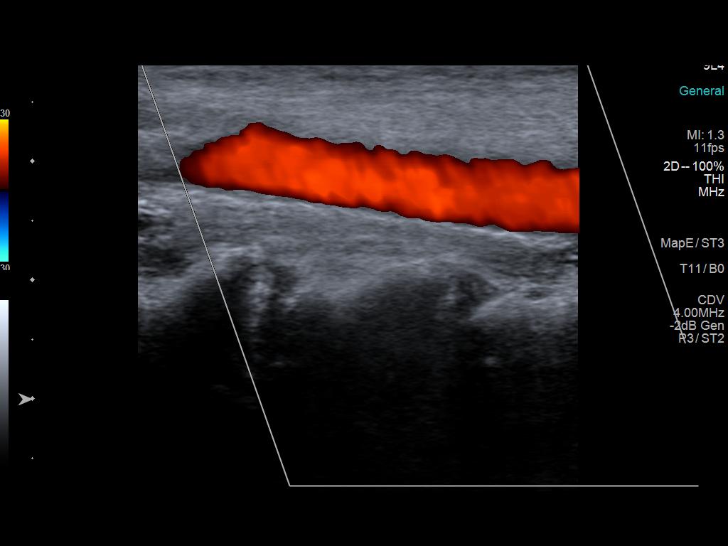
[im 41/56]
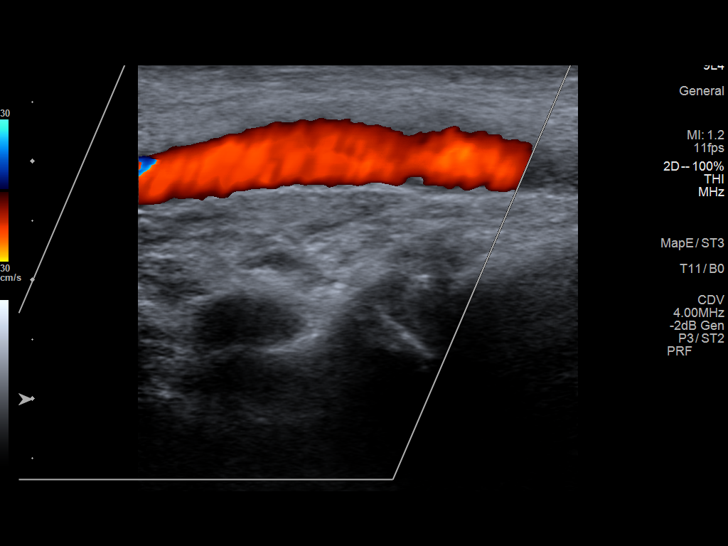
[im 46/56]
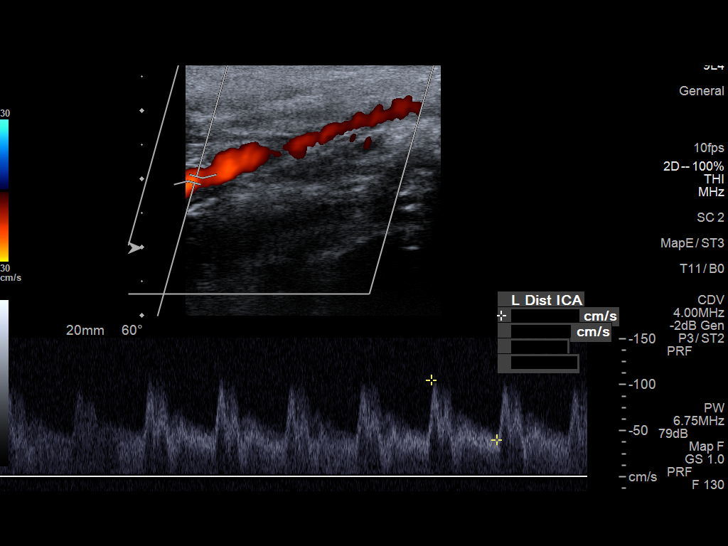
[im 51/56]
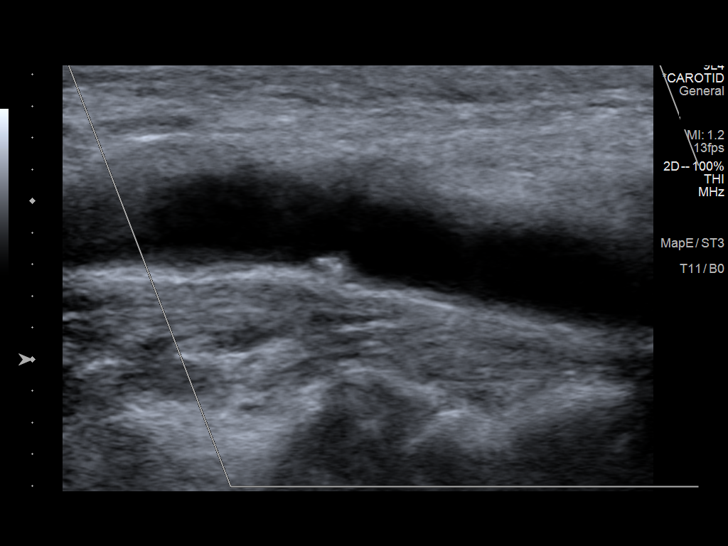
[im 56/56]
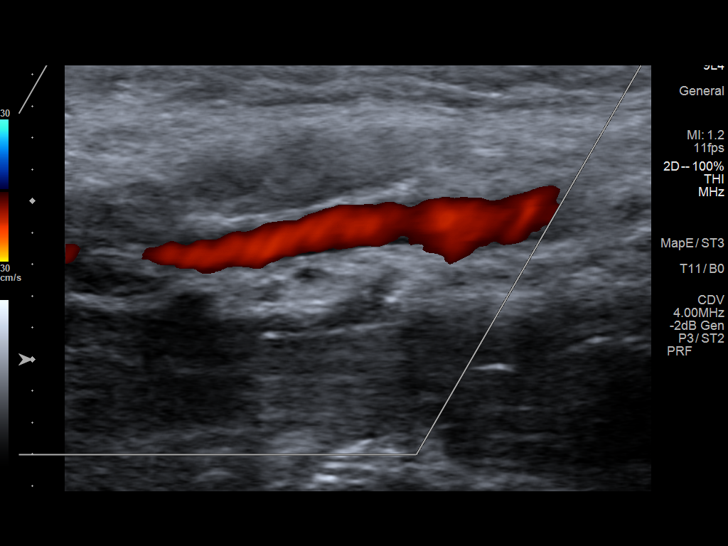

[13 of 24 positions shown; findings below may reference images not displayed]

FINDINGS: Criteria: Quantification of carotid stenosis is based on velocity
parameters that correlate the residual internal carotid diameter
with NASCET-based stenosis levels, using the diameter of the distal
internal carotid lumen as the denominator for stenosis measurement.

The following velocity measurements were obtained:

RIGHT

ICA:  Systolic 95 cm/sec, Diastolic 39 cm/sec

CCA:  91 cm/sec

SYSTOLIC ICA/CCA RATIO:

ECA:  84 cm/sec

LEFT

ICA:  Systolic 105 cm/sec, Diastolic 40 cm/sec

CCA:  97 cm/sec

SYSTOLIC ICA/CCA RATIO:

ECA:  75 cm/sec

Right Brachial SBP: Not acquired

Left Brachial SBP: Not acquired

RIGHT CAROTID ARTERY: No significant calcified disease of the right
common carotid artery. Intermediate waveform maintained.
Heterogeneous plaque without significant calcifications at the right
carotid bifurcation. Low resistance waveform of the right ICA. No
significant tortuosity.

RIGHT VERTEBRAL ARTERY: Antegrade flow with low resistance waveform.

LEFT CAROTID ARTERY: No significant calcified disease of the left
common carotid artery. Intermediate waveform maintained.
Heterogeneous plaque at the left carotid bifurcation without
significant calcifications. Low resistance waveform of the left ICA.

LEFT VERTEBRAL ARTERY:  Antegrade flow with low resistance waveform.
IMPRESSION: Color duplex indicates minimal heterogeneous plaque, with no
hemodynamically significant stenosis by duplex criteria in the
extracranial cerebrovascular circulation.

## 2022-11-01 ENCOUNTER — Ambulatory Visit: Payer: Commercial Managed Care - PPO | Admitting: Neurology

## 2022-11-01 ENCOUNTER — Encounter: Payer: Self-pay | Admitting: Sports Medicine

## 2022-11-01 DIAGNOSIS — G5601 Carpal tunnel syndrome, right upper limb: Secondary | ICD-10-CM

## 2022-11-01 DIAGNOSIS — R202 Paresthesia of skin: Secondary | ICD-10-CM

## 2022-11-01 DIAGNOSIS — G5622 Lesion of ulnar nerve, left upper limb: Secondary | ICD-10-CM

## 2022-11-01 NOTE — Procedures (Signed)
[facility]  [address] [place], [address]  [place], [place] [address] Tel: [phone] Fax: [phone] Test Date:  11/01/2022  Patient: Dawn Griffin DOB: 06-26-1970 Physician: Nita Sickle, DO  Sex: Female Height: 5\' 4"  Ref Phys: Rodney Langton, MD  ID#: [id number]   Technician:    History: This is a 52 year old female referred for evaluation of bilateral hand paresthesias, worse over the left fifth digit.  NCV & EMG Findings: Extensive electrodiagnostic testing of the left upper extremity and additional studies of the right shows:  Left ulnar sensory response shows reduced amplitude (L3.8 V).  Right mixed palmar sensory response shows prolonged latency.  Bilateral median and right ulnar sensory responses are within normal limits. Left median motor response shows reduced amplitude.  These findings are most likely due to localized muscle atrophy from musculoskeletal pathology in the setting of normal needle electrode examination.  Left ulnar motor response shows reduced amplitude at the abductor digiti minimi and first dorsal interosseous muscles (L5.2, L3.5 mV), with associated slowed conduction velocity across the elbow (A Elbow-B Elbow, L45, L40 m/s).  Right median and right ulnar sensory responses are within normal limits.  There is evidence of bilateral Martin-Gruber anastomoses, a normal anatomic variant. Reduced recruitment is seen in the left ulnar innervated muscles, without accompanied active denervation or changes in motor unit configuration.  These findings are not present in the right upper extremity.  Impression: Left ulnar neuropathy with slowing across the elbow, with demyelinating and axonal features, moderate-to-severe. Right median neuropathy at or distal to the wrist (very mild), consistent with a clinical diagnosis of carpal tunnel syndrome.     ___________________________ Nita Sickle, DO    Nerve Conduction Studies   Stim Site NR Peak (ms)  Norm Peak (ms) O-P Amp (V) Norm O-P Amp  Left Median Anti Sensory (2nd Digit)  32 C  Wrist    3.3 <3.6 31.4 >15  Right Median Anti Sensory (2nd Digit)  32 C  Wrist    3.4 <3.6 24.9 >15  Left Ulnar Anti Sensory (5th Digit)  32 C  Wrist    2.6 <3.1 *3.8 >10  Right Ulnar Anti Sensory (5th Digit)  32 C  Wrist    2.6 <3.1 28.3 >10     Stim Site NR Onset (ms) Norm Onset (ms) O-P Amp (mV) Norm O-P Amp Site1 Site2 Delta-0 (ms) Dist (cm) Vel (m/s) Norm Vel (m/s)  Left Median Motor (Abd Poll Brev)  32 C  Wrist    3.5 <4.0 *3.1 >6 Elbow Wrist 5.0 28.0 56 >50  Elbow    8.5  2.7  Axilla Elbow 5.3 0.0    Ulnar-wrist crossover    3.2  1.1         Right Median Motor (Abd Poll Brev)  32 C  Wrist    3.4 <4.0 8.2 >6 Elbow Wrist 5.5 28.0 51 >50  Elbow    8.9  7.9  Ulnar-wrist crossover Elbow 1.8 0.0    Ulnar-wrist crossover    7.1  2.3         Left Ulnar Motor (Abd Dig Minimi)  32 C  Wrist    2.5 <3.1 *5.2 >7 B Elbow Wrist 3.3 22.0 67 >50  B Elbow    5.8  4.3  A Elbow B Elbow 2.2 10.0 *45 >50  A Elbow    8.0  4.0         Right Ulnar Motor (Abd Dig Minimi)  32 C  Wrist  2.1 <3.1 9.1 >7 B Elbow Wrist 3.5 22.0 63 >50  B Elbow    5.6  7.5  A Elbow B Elbow 1.5 10.0 67 >50  A Elbow    7.1  6.5         Left Ulnar (FDI) Motor (1st DI)  32 C  Wrist    3.8 <4.5 *3.5 >7 B Elbow Wrist 4.2 22.0 52 >50  B Elbow    8.0  2.6  A Elbow B Elbow 2.5 10.0 *40 >50  A Elbow    10.5  2.3            Stim Site NR Peak (ms) Norm Peak (ms) P-T Amp (V) Site1 Site2 Delta-P (ms) Norm Delta (ms)  Left Median/Ulnar Palm Comparison (Wrist - 8cm)  32 C  Median Palm    1.7 <2.2 114.1 Median Palm Ulnar Palm    Ulnar Palm *NR  <2.2       Right Median/Ulnar Palm Comparison (Wrist - 8cm)  32 C  Median Palm    2.1 <2.2 67.6 Median Palm Ulnar Palm *0.7   Ulnar Palm    1.4 <2.2 32.8       Electromyography   Side Muscle Ins.Act Fibs Fasc Recrt Amp Dur Poly Activation Comment  Right 1stDorInt Nml Nml Nml Nml Nml Nml Nml  Nml N/A  Right Abd Poll Brev Nml Nml Nml Nml Nml Nml Nml Nml N/A  Right PronatorTeres Nml Nml Nml Nml Nml Nml Nml Nml N/A  Right Biceps Nml Nml Nml Nml Nml Nml Nml Nml N/A  Right Triceps Nml Nml Nml Nml Nml Nml Nml Nml N/A  Right Deltoid Nml Nml Nml Nml Nml Nml Nml Nml N/A  Left Abd Dig Min Nml Nml Nml *1- Nml Nml Nml Nml N/A  Left FlexCarpiUln Nml Nml Nml *1- Nml Nml Nml Nml N/A  Left Ext Indicis Nml Nml Nml Nml Nml Nml Nml Nml N/A  Left 1stDorInt Nml Nml Nml *1- Nml Nml Nml Nml N/A  Left Abd Poll Brev Nml Nml Nml Nml Nml Nml Nml Nml N/A  Left PronatorTeres Nml Nml Nml Nml Nml Nml Nml Nml N/A  Left Biceps Nml Nml Nml Nml Nml Nml Nml Nml N/A  Left Triceps Nml Nml Nml Nml Nml Nml Nml Nml N/A  Left Deltoid Nml Nml Nml Nml Nml Nml Nml Nml N/A      Waveforms:

## 2022-11-05 ENCOUNTER — Ambulatory Visit: Payer: Commercial Managed Care - PPO | Admitting: Sports Medicine

## 2022-11-05 ENCOUNTER — Telehealth: Payer: Self-pay | Admitting: Family Medicine

## 2022-11-05 ENCOUNTER — Other Ambulatory Visit (INDEPENDENT_AMBULATORY_CARE_PROVIDER_SITE_OTHER): Payer: Commercial Managed Care - PPO

## 2022-11-05 ENCOUNTER — Encounter: Payer: Self-pay | Admitting: Sports Medicine

## 2022-11-05 DIAGNOSIS — R200 Anesthesia of skin: Secondary | ICD-10-CM

## 2022-11-05 DIAGNOSIS — R4189 Other symptoms and signs involving cognitive functions and awareness: Secondary | ICD-10-CM

## 2022-11-05 MED ORDER — AMPHETAMINE-DEXTROAMPHETAMINE 15 MG PO TABS
15.0000 mg | ORAL_TABLET | Freq: Two times a day (BID) | ORAL | 0 refills | Status: DC
Start: 2022-11-05 — End: 2023-03-27

## 2022-11-05 MED ORDER — TRIAMCINOLONE ACETONIDE 40 MG/ML IJ SUSP
40.0000 mg | Freq: Once | INTRAMUSCULAR | Status: AC
Start: 2022-11-05 — End: 2022-11-05
  Administered 2022-11-05: 40 mg via INTRAMUSCULAR

## 2022-11-05 NOTE — Assessment & Plan Note (Signed)
Very pleasant 52 year old female, 2 to 3 weeks of increasing numbness left hand ulnar nerve distribution, no neck pain, no trauma, worse at night, progressive weakness. Positive cubital tunnel Tinel's sign, we obtained a nerve conduction and EMG that did show severe ulnar neuropathy at the elbow. Today I did a ulnar nerve hydrodissection/injection at the cubital tunnel with ultrasound guidance. She also has an appointment coming up with hand surgery. Return to see me in about 4 to 6 weeks.

## 2022-11-05 NOTE — Addendum Note (Signed)
Addended by: Holland Falling on: 11/05/2022 04:35 PM   Modules accepted: Orders

## 2022-11-05 NOTE — Progress Notes (Signed)
    Procedures performed today:    Procedure: Real-time Ultrasound Guided hydrodissection of the right ulnar nerve at the cubital tunnel Device: Samsung [id number] Verbal informed consent obtained.  Time-out conducted.  Noted no overlying erythema, induration, or other signs of local infection.  Skin prepped in a sterile fashion.  Local anesthesia: Topical Ethyl chloride.  With sterile technique and under real time ultrasound guidance: I noted an enlarged nerve. Using a 25-gauge needle advanced into the cubital tunnel, taking care to avoid intraneural injection I injected medication both superficial to and deep to the ulnar nerve freeing it from surrounding structures, for a total of 1 cc kenalog 40, 5 cc 1% lidocaine without epinephrine. Completed without difficulty  Advised to call if fevers/chills, erythema, induration, drainage, or persistent bleeding.  Images permanently stored and available for review in PACS.  Impression: Technically successful ultrasound guided ulnar nerve hydrodissection.  Independent interpretation of notes and tests performed by another provider:   None.  Brief History, Exam, Impression, and Recommendations:    Bilateral carpal tunnel syndrome, left cubital tunnel syndrome Very pleasant 52 year old female, 2 to 3 weeks of increasing numbness left hand ulnar nerve distribution, no neck pain, no trauma, worse at night, progressive weakness. Positive cubital tunnel Tinel's sign, we obtained a nerve conduction and EMG that did show severe ulnar neuropathy at the elbow. Today I did a ulnar nerve hydrodissection/injection at the cubital tunnel with ultrasound guidance. She also has an appointment coming up with hand surgery. Return to see me in about 4 to 6 weeks.    ____________________________________________ Ihor Austin. Benjamin Stain, M.D., ABFM., CAQSM., AME. Primary Care and Sports Medicine [facility]  Adjunct Professor of Family Medicine   [employer]  Restaurant manager, fast food

## 2022-11-05 NOTE — Telephone Encounter (Signed)
Meds ordered this encounter  Medications   amphetamine-dextroamphetamine (ADDERALL) 15 MG tablet    Sig: Take 1 tablet by mouth 2 (two) times daily.    Dispense:  180 tablet    Refill:  0

## 2022-11-08 ENCOUNTER — Ambulatory Visit: Payer: Commercial Managed Care - PPO | Admitting: Sports Medicine

## 2022-11-13 ENCOUNTER — Other Ambulatory Visit: Payer: Self-pay | Admitting: *Deleted

## 2022-11-13 DIAGNOSIS — M47816 Spondylosis without myelopathy or radiculopathy, lumbar region: Secondary | ICD-10-CM

## 2022-11-13 MED ORDER — DULOXETINE HCL 30 MG PO CPEP: 30.0000 mg | ORAL_CAPSULE | Freq: Every day | ORAL | 0 refills | Status: DC

## 2022-11-14 ENCOUNTER — Telehealth: Payer: Self-pay

## 2022-11-14 NOTE — Telephone Encounter (Signed)
Received surgical clearance from [facility] for the patient. Letter has been faxed back to [facility] at [phone].

## 2022-11-20 ENCOUNTER — Encounter: Payer: Self-pay | Admitting: Sports Medicine

## 2022-12-02 ENCOUNTER — Other Ambulatory Visit: Payer: Self-pay

## 2022-12-02 ENCOUNTER — Encounter: Payer: Self-pay | Admitting: Internal Medicine

## 2022-12-02 MED ORDER — VIBERZI 75 MG PO TABS: 75.0000 mg | ORAL_TABLET | Freq: Two times a day (BID) | ORAL | 1 refills | Status: DC

## 2022-12-04 ENCOUNTER — Other Ambulatory Visit: Payer: Self-pay

## 2022-12-04 MED ORDER — VIBERZI 75 MG PO TABS: 75.0000 mg | ORAL_TABLET | Freq: Two times a day (BID) | ORAL | 1 refills | Status: DC

## 2022-12-05 ENCOUNTER — Telehealth: Payer: Self-pay

## 2022-12-05 NOTE — Telephone Encounter (Signed)
Received fax from [facility] stating that they need a signed prescription for Viberzi faxed to them. Called [facility] and informed them that I did fax a hand signed prescription for Viberzi yesterday. They then indicated that since Viberzi is a controlled substance that it needs to be sent directly e-signed from Dr. Rhea Belton. Informed them that that is not what they indicated on the fax I received yesterday. They said they will take a verbal order this time. Gave a verbal order over the phone for Viberzi.

## 2022-12-12 HISTORY — PX: ULNAR TUNNEL RELEASE: SHX820

## 2022-12-19 ENCOUNTER — Other Ambulatory Visit: Payer: Self-pay | Admitting: Medical Genetics

## 2022-12-19 DIAGNOSIS — Z006 Encounter for examination for normal comparison and control in clinical research program: Secondary | ICD-10-CM

## 2022-12-19 NOTE — Progress Notes (Signed)
Office Visit Note  Patient: Dawn Griffin             Date of Birth: Mar 02, 1971           MRN: [id number]             PCP: Agapito Games, MD Referring: Agapito Games, * Visit Date: 01/02/2023   Subjective:  Follow-up (Patient had cubital release surgery on the left elbow. Patient states she not has an infection due to a fall. Patient states she did take her medications yesterday before she knew she has an infection. Patient states she is getting surgery for infection removal today. )   History of Present Illness: Dawn Griffin is a 52 y.o. 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 weekly and folic acid 1 mg daily.  Unfortunately recent complications with her left elbow she went for decompression surgery for cubital tunnel syndrome.  This is complicated by then falling onto her elbow causing wound dehiscence with associated purulent drainage.  Initially was treated with Steri-Strips occurring at ED but follow-up in orthopedics office with additional washout planned later today..  Due to the surgeries and associated infection complication she has been off medication has had some increase in symptoms particularly knee pain and swelling.  Previous HPI 10/02/2022 Dawn Griffin is a 53 y.o. 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 weekly and folic acid 1 mg daily.  Since her last visit she became sick with viral upper respiratory symptoms about a month ago these lingered for about 3 weeks with congestion drainage and cough no fevers.  Treatment cleared up and she went to the beach at [facility] with increased physical activity there did have some flareup of bilateral knee swelling worse on the right side started taking prednisone taper for this initially 20 mg daily down to 10.  Not having  any ongoing pulmonary symptoms.   Previous HPI 07/03/2022 Dawn Griffin is a 52 y.o. 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 weekly and folic acid 1 mg daily.  Overall she has been doing pretty well a few symptoms have acted up feels she is having some swelling in the right knee particularly first thing in the mornings that is more than previous.  Is also noticing some difficulty fully flexing the right index finger and occasionally gets a rubbing or popping sensation.  Has noticed new pitting lesions in the fingertips especially over the wintertime.   Previous HPI 12/31/21 Dawn Griffin is a 52 y.o. 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 weekly and folic acid 1 mg daily.  Overall has been doing fairly well since her last visit without major symptom exacerbations.  She was switched on medicines for the pancreatic insufficiency due to loss of efficacy on her previous treatment and is doing well again.  She had some basal cell cancers excised uneventfully.  Continues to have joint pain affecting the right shoulder low back and left hip has not seen peripheral joint swelling come back.  Raynaud's symptoms have been reasonably controlled she is taking the ADHD medications only as needed.   Previous HPI 06/27/2021 Dawn Griffin is  a 52 y.o. female here for follow up for RA/CREST overlap on orencia 125 mg [place] weekly, HCQ 300 mg daily, and MTX 10 mg PO weekly.  Her RA has been doing well followed up with her doctors in [place] felt this is stable.  She saw Dr. Reche Dixon with no new concerning findings on skin exam at that time recommended to continue medications including the methotrexate. She has increase in low back pain since around Christmas time she thinks this was some type of overuse or injury when  carrying kids around.  Since this started she had severe pain with left leg symptoms with numbness and pain going down the leg and in the foot.  She had updated imaging of her low back in January and epidural shot performed was very helpful for the radicular symptoms.  She has scheduled upcoming appointments with orthopedic surgery and neurosurgery about more definitive management options for this problem.   Previous HPI 02/26/21 Dawn Griffin is a 52 y.o. female here for follow up for RA/CREST overlap on orencia 125 mg [place] weekly, HCQ 300 mg daily, MTX 10 mg PO weekly, and folic acid 1 mg daily. We decreased methotrexate dose after last visit due to elevation in liver function tests.  Overall she feels her arthritis and scleroderma symptoms are pretty well controlled.  She is experiencing increased verity of Raynaud's symptoms with some new digital pitting during the colder weather but is very proactive with coats gloves and hot hands heating packs.  Continuing to have good GI symptom improvement with the Creon.  Overall she feels her mood and body aches are just overall worse and feels very down which she states has come and gone periodically in previous months.     Previous HPI 05/25/20 Dawn Griffin is a 52 y.o. female with history of carotid artery aneurysm and centrilobular emphysema here for evaluation of rheumatoid arthritis currently on methotrexate and orencia infusion. 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 She was first evaluated with rheumatology around 2014 due to hand pain, stiffness, skin changes, raynaud's, telangiectasias but not started any long term treatments until in 2018 with worsening symptoms was diagnosed with RA. She takes methotrexate and hydroxychloroquine for over a year without significant improvement in symptoms, mostly only feeling improvement when on low dose prednisone. Enbrel was added to her treatment but she  does not feel this made a particularly good benefit in symptoms. She start Orencia infusion IV since about 2 months ago and also has not seen much improvement yet, although tolerating the treatment okay. She does not have much joint swelling most of the time, although in the past when she stopped all medicines for a time large bilateral knee swelling occurred and also hand swelling involving the whole hand and fingers. Doing okay today, currently on 10 mg prednisone she restarted taking it at 20 mg dose and decreased it feeling a bit worse but currently much better than off the medicine.   She had extensive cardiac workup in December with abnormal EKG and subsequent TTE without any obvious problems and cardiac CT checked with no coronary disease problems seen. She was also found to have carotid artery aneurysm during evaluation for vagal nerve stimulator placement as a participant with RESET-RA trial.   She has skin thickening and Raynaud's numerous telangiectasias on hands bilaterally.  She is not currently on any medicine for this.  She has a few areas of small pitting or scarring.  She previously did not try calcium channel blocker due to concern of side effects with a fairly low baseline blood pressure.  She has chronic diarrhea symptoms has had colonoscopy that was unremarkable.  She is also had swallowing study but does not describe prior upper GI endoscopy.   She has pulmonary emphysema but with good function on PFTs and is following up regularly with pulmonology.  This has been attributed to prior smoking history which she has to reduce but not stopped entirely.  She does have pretty frequent shortness of breath with exertion but no history of hypoxia or abnormal stress testing or PFTs.  She does not notice significant peripheral edema.   Labs reviewed 04/2016 ANA 1:1280 centromere RF 16.3 CCP 117   Review of Systems  Constitutional:  Positive for fatigue.  HENT:  Positive for mouth dryness.  Negative for mouth sores.   Eyes:  Negative for dryness.  Respiratory:  Negative for shortness of breath.   Cardiovascular:  Negative for chest pain and palpitations.  Gastrointestinal:  Negative for blood in stool, constipation and diarrhea.  Endocrine: Negative for increased urination.  Genitourinary:  Negative for involuntary urination.  Musculoskeletal:  Positive for joint pain, joint pain, joint swelling and morning stiffness. Negative for gait problem, myalgias, muscle weakness, muscle tenderness and myalgias.  Skin:  Positive for color change and sensitivity to sunlight. Negative for rash and hair loss.  Allergic/Immunologic: Negative for susceptible to infections.  Neurological:  Negative for dizziness and headaches.  Hematological:  Negative for swollen glands.  Psychiatric/Behavioral:  Negative for depressed mood and sleep disturbance. The patient is not nervous/anxious.     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   GERD (gastroesophageal reflux disease) 07/06/2020   High risk medication use 05/25/2020   Vitamin D deficiency 05/25/2020   S/P coil embolization of cerebral aneurysm 02/28/2020   Centrilobular emphysema (HCC) 08/04/2019   Chronic fatigue 05/10/2019   Brain fog 05/10/2019   Myofascial pain 06/30/2018   Rheumatoid arthritis (HCC) 05/20/2017   Bilateral carpal tunnel syndrome, left cubital tunnel syndrome 08/17/2013   Arthritis of left carpometacarpal joint 02/26/2013   SOB (shortness of breath) 02/26/2013   ANEMIA, HX OF 01/10/2010   URINARY URGENCY 12/21/2009    Enlarged lymph nodes 08/15/2008    Past Medical History:  Diagnosis Date   Aneurysm (HCC) 02/2020   Aortic atherosclerosis (HCC)    CREST (calcinosis, Raynaud's phenomenon, esophageal dysfunction, sclerodactyly, telangiectasia) (HCC) 2022   Emphysema of lung (HCC)    GERD (gastroesophageal reflux disease)    IBS (irritable bowel syndrome)    Irritable bowel syndrome with diarrhea    Pancreatic divisum    Raynaud disease    Rheumatoid arthritis (HCC)    Sessile colonic polyp     Family History  Problem Relation Age of Onset   Bipolar disorder Mother    Lung cancer Mother    Lung cancer Father    Gout Father    Healthy Sister    Lupus Sister    Arthritis/Rheumatoid Maternal Grandmother    Stomach cancer Paternal Grandmother    Healthy Brother  Colon cancer Neg Hx    Esophageal cancer Neg Hx    Colon polyps Neg Hx    Rectal cancer Neg Hx    Breast cancer Neg Hx    Past Surgical History:  Procedure Laterality Date   ANEURYSM COILING  02/2020   ELBOW SURGERY Left    FINGER ARTHROSCOPY WITH CARPOMETACARPEL ([facility]) ARTHROPLASTY Left 03/2019   LUMBAR SPINE SURGERY  2001   L4-5-S1   TONSILLECTOMY  1990   VAGUS NERVE STIMULATOR INSERTION  09/2019   Social History   Social History Narrative   No exercise. + daily caffeine.    Immunization History  Administered Date(s) Administered   Influenza Whole 01/10/2010   Tdap 01/11/2013     Objective: Vital Signs: BP 116/73 (BP Location: Right Arm, Patient Position: Sitting, Cuff Size: Normal)   Pulse 78   Resp 14   Ht 5\' 4"  (1.626 m)   Wt 125 lb (56.7 kg)   BMI 21.46 kg/m    Physical Exam Eyes:     Conjunctiva/sclera: Conjunctivae normal.  Cardiovascular:     Rate and Rhythm: Normal rate and regular rhythm.  Pulmonary:     Effort: Pulmonary effort is normal.     Breath sounds: Normal breath sounds.  Lymphadenopathy:     Cervical: No cervical adenopathy.  Skin:    General: Skin is warm and dry.     Comments:  Numerous telangiectasias lips and both hands Chronic skin thickening in both hands especially distal to MCP joints No current digital pitting or lesions   Neurological:     Mental Status: She is alert.  Psychiatric:        Mood and Affect: Mood normal.      Musculoskeletal Exam:  Shoulders full ROM no tenderness or swelling Left elbow in wrap, reviewed photos of reopened surgical site on phone Wrists full ROM no tenderness or swelling Fingers full ROM, heberdon's nodes present and 1st CMC joint squaring worse on left Knees full ROM no tenderness or swelling patellofemoral crepitus present more on right  Investigation: No additional findings.  Imaging: No results found.  Recent Labs: Lab Results  Component Value Date   WBC 14.8 (H) 10/02/2022   HGB 14.0 10/02/2022   PLT 326 10/02/2022   NA 138 10/02/2022   K 4.8 10/02/2022   CL 102 10/02/2022   CO2 28 10/02/2022   GLUCOSE 120 (H) 10/02/2022   BUN 12 10/02/2022   CREATININE 0.77 10/02/2022   BILITOT 0.3 10/02/2022   ALKPHOS 38 03/18/2016   AST 19 10/02/2022   ALT 20 10/02/2022   PROT 7.0 10/02/2022   ALBUMIN 4.0 03/18/2016   CALCIUM 10.2 10/02/2022   GFRAA 102 09/06/2020   QFTBGOLDPLUS NEGATIVE 12/31/2021    Speciality Comments: PLQ Eye Exam 08/20/2022 normal [place] f/u 1 year  Procedures:  No procedures performed Allergies: Patient has no known allergies.   Assessment / Plan:     Visit Diagnoses: Rheumatoid arthritis involving both hands with positive rheumatoid factor (HCC)  Joint inflammation recently slightly increased particularly at knee but was also off medications due to surgery and infection.  Will need to remain off these until completing antibiotics and would recommend waiting 2 weeks after repeat elbow surgery.  Plan to continue Orencia 125 mg subcu weekly hydroxychloroquine 200 mg daily methotrexate 10 mg p.o. weekly folic acid 1 mg daily.  High risk medication use - Orencia 125 mg subcu weekly  hydroxychloroquine 200 mg daily methotrexate 10 mg p.o. weekly folic acid 1  mg daily. PLQ Eye Exam 08/20/2022 normal.  Recent perioperative labs obtained reviewed normal blood count and metabolic panel.  Has had infectious complication after wound dehiscence at the elbow.  I suspect this is primarily related to her scleroderma which is associated with impaired wound healing.  Pancreatic insufficiency - Viberzi supplementation  Continues follow-up with Dr. Rhea Belton for GI.  Symptoms currently doing well.  Limited scleroderma (HCC)  Symptoms appear stable no obvious progression of skin disease.  Ongoing Raynaud's without any new lesions or critical digital ischemia.  S/P coil embolization of cerebral aneurysm - Plan is for 5-year follow-up.  Orders: No orders of the defined types were placed in this encounter.  No orders of the defined types were placed in this encounter.    Follow-Up Instructions: Return in about 3 months (around 04/04/2023) for RA/CREST on ABA/HCQ/MTX f/u 3mos.   Fuller Plan, MD  Note - This record has been created using [employer].  Chart creation errors have been sought, but may not always  have been located. Such creation errors do not reflect on  the standard of medical care.

## 2023-01-02 ENCOUNTER — Encounter: Payer: Self-pay | Admitting: Internal Medicine

## 2023-01-02 ENCOUNTER — Ambulatory Visit: Payer: Commercial Managed Care - PPO | Attending: Internal Medicine | Admitting: Internal Medicine

## 2023-01-02 VITALS — BP 116/73 | HR 78 | Resp 14 | Ht 64.0 in | Wt 125.0 lb

## 2023-01-02 DIAGNOSIS — M349 Systemic sclerosis, unspecified: Secondary | ICD-10-CM | POA: Diagnosis not present

## 2023-01-02 DIAGNOSIS — Z79899 Other long term (current) drug therapy: Secondary | ICD-10-CM

## 2023-01-02 DIAGNOSIS — K8689 Other specified diseases of pancreas: Secondary | ICD-10-CM

## 2023-01-02 DIAGNOSIS — Z9889 Other specified postprocedural states: Secondary | ICD-10-CM

## 2023-01-02 DIAGNOSIS — M05741 Rheumatoid arthritis with rheumatoid factor of right hand without organ or systems involvement: Secondary | ICD-10-CM

## 2023-01-02 DIAGNOSIS — M05742 Rheumatoid arthritis with rheumatoid factor of left hand without organ or systems involvement: Secondary | ICD-10-CM

## 2023-01-02 DIAGNOSIS — R0602 Shortness of breath: Secondary | ICD-10-CM

## 2023-01-06 ENCOUNTER — Other Ambulatory Visit: Payer: Self-pay | Admitting: Internal Medicine

## 2023-01-06 DIAGNOSIS — M0579 Rheumatoid arthritis with rheumatoid factor of multiple sites without organ or systems involvement: Secondary | ICD-10-CM

## 2023-01-06 DIAGNOSIS — Z111 Encounter for screening for respiratory tuberculosis: Secondary | ICD-10-CM

## 2023-01-06 NOTE — Telephone Encounter (Signed)
Last Fill: 07/03/2022  Labs: 10/02/2022 Glucose 120 WBC 14.8 Neutro Abs 12,521 Absolute Monocytes 178  TB Gold: 12/31/2021 Negative   Next Visit: 04/03/2022  Last Visit: 01/02/2023  DX: Rheumatoid arthritis involving both hands with positive rheumatoid factor   Current Dose per office note 01/02/2023: Orencia 125 mg subcu weekly   Patient states she had labs done is [facility] and they should fax over the results, the patient states they can be slow. Patient states she can come in to the office to get her TB Gold test done. TB Gold test has been pended to review and sign.   Okay to refill Orencia?

## 2023-01-08 ENCOUNTER — Encounter: Payer: Self-pay | Admitting: Internal Medicine

## 2023-01-22 ENCOUNTER — Other Ambulatory Visit: Payer: Self-pay | Admitting: Internal Medicine

## 2023-01-22 DIAGNOSIS — M05741 Rheumatoid arthritis with rheumatoid factor of right hand without organ or systems involvement: Secondary | ICD-10-CM

## 2023-01-22 NOTE — Telephone Encounter (Signed)
Last Fill: 07/03/2022  Eye exam: 08/20/2022 normal    Labs: 10/02/2022 WBC 14.8 Neutro Abs 12,521 Absolute Monocytes 178 Glucose 120  Next Visit: 04/04/2023  Last Visit: 01/02/2023  [id number] arthritis involving both hands with positive rheumatoid factor   Current Dose per office note 01/02/2023: hydroxychloroquine 200 mg daily   Okay to refill Plaquenil?

## 2023-01-27 ENCOUNTER — Other Ambulatory Visit: Payer: Self-pay | Admitting: Internal Medicine

## 2023-01-27 DIAGNOSIS — M0579 Rheumatoid arthritis with rheumatoid factor of multiple sites without organ or systems involvement: Secondary | ICD-10-CM

## 2023-01-27 NOTE — Telephone Encounter (Signed)
Last Fill: 03/13/2022  Next Visit: 04/04/2023  Last Visit: 01/02/2023  Dx: Rheumatoid arthritis involving both hands with positive rheumatoid factor   Current Dose per office note on 01/02/2023: folic acid 1 mg daily   Okay to refill Folic Acid?

## 2023-03-21 NOTE — Progress Notes (Signed)
Office Visit Note  Patient: Dawn Griffin             Date of Birth: [birth year]           MRN: [id number]             PCP: Agapito Games, MD Referring: Agapito Games, * Visit Date: 04/04/2023   Subjective:  Follow-up (Patient states she has not been doing well. Patient states her swelling in the morning is worse than is used to be and her hands are hurting her. )   History of Present Illness:   Discussed the use of AI scribe software for clinical note transcription with the patient, who gave verbal consent to proceed.  History of Present Illness   Dawn Griffin is a 53 y.o. 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 weekly and folic acid 1 mg daily.  The patient is now scheduled for a second surgery involving a transposition of the ulnar nerve and a possible tendon lengthening due to lack of resolution with the cubital tunnel release surgery.  In the past three weeks, the patient has experienced increased pain in the elbow, describing it as sore to touch and painful when lifting objects. The pain is localized under the elbow, on the bone, and does not appear to be nerve-related. The patient also reports stiffness and soreness in the right hand, particularly in the morning, and numbness in the right hand with any significant use.  The patient has a history of carpal tunnel syndrome, which has been managed with a brace. However, the patient reports that the carpal tunnel symptoms have worsened, with numbness upon waking. The patient also reports skin discoloration and scabbing on one finger, which has been persistent.  In addition to the hand and elbow issues, the patient reports swelling in the knees upon waking, which subsides throughout the day. The patient also reports a return of sciatic pain, particularly in the mornings. The patient has a history of back problems, which  were previously managed with cyclobenzaprine.  The patient has been on methotrexate for an extended period, but reports no noticeable improvement from the medication. Recent liver enzyme tests have shown slightly elevated levels, which may be related to the methotrexate. The patient has also been on Orencia, which initially seemed to help but has not provided significant relief recently.    Previous HPI 01/02/2023 Dawn Griffin is a 53 y.o. 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 weekly and folic acid 1 mg daily.  Unfortunately recent complications with her left elbow she went for decompression surgery for cubital tunnel syndrome.  This is complicated by then falling onto her elbow causing wound dehiscence with associated purulent drainage.  Initially was treated with Steri-Strips occurring at ED but follow-up in orthopedics office with additional washout planned later today.  Due to the surgeries and associated infection complication she has been off medication has had some increase in symptoms particularly knee pain and swelling.   Previous HPI 10/02/2022 Dawn Griffin is a 53 y.o. 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 weekly and folic acid 1 mg daily.  Since her last visit she became sick with viral upper respiratory symptoms about a month ago these lingered  for about 3 weeks with congestion drainage and cough no fevers.  Treatment cleared up and she went to the [facility] at [facility] with increased physical activity there did have some flareup of bilateral knee swelling worse on the right side started taking prednisone taper for this initially 20 mg daily down to 10.  Not having any ongoing pulmonary symptoms.   Previous HPI 07/03/2022 Dawn Griffin is a 53 y.o. 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 weekly and folic acid 1 mg daily.  Overall she has been doing pretty well a few symptoms have acted up feels she is having some swelling in the right knee particularly first thing in the mornings that is more than previous.  Is also noticing some difficulty fully flexing the right index finger and occasionally gets a rubbing or popping sensation.  Has noticed new pitting lesions in the fingertips especially over the wintertime.   Previous HPI 12/31/21 Dawn Griffin is a 53 y.o. 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 weekly and folic acid 1 mg daily.  Overall has been doing fairly well since her last visit without major symptom exacerbations.  She was switched on medicines for the pancreatic insufficiency due to loss of efficacy on her previous treatment and is doing well again.  She had some basal cell cancers excised uneventfully.  Continues to have joint pain affecting the right shoulder low back and left hip has not seen peripheral joint swelling come back.  Raynaud's symptoms have been reasonably controlled she is taking the ADHD medications only as needed.   Previous HPI 06/27/2021 Dawn Griffin is a 53 y.o. female here for follow up for RA/CREST overlap on orencia 125 mg [place] weekly, HCQ 300 mg daily, and MTX 10 mg PO weekly.  Her RA has been doing well followed up with her doctors in [place] felt this is stable.  She saw Dr. Reche Dixon with no new concerning findings on skin exam at that time recommended to continue medications including the methotrexate. She has increase in low back pain since around Christmas time she thinks this was some type of overuse or injury when carrying kids around.  Since this started she had severe pain with left leg symptoms with numbness and pain going down the leg  and in the foot.  She had updated imaging of her low back in January and epidural shot performed was very helpful for the radicular symptoms.  She has scheduled upcoming appointments with orthopedic surgery and neurosurgery about more definitive management options for this problem.   Previous HPI 02/26/21 Mikya Don is a 53 y.o. female here for follow up for RA/CREST overlap on orencia 125 mg [place] weekly, HCQ 300 mg daily, MTX 10 mg PO weekly, and folic acid 1 mg daily. We decreased methotrexate dose after last visit due to elevation in liver function tests.  Overall she feels her arthritis and scleroderma symptoms are pretty well controlled.  She is experiencing increased verity of Raynaud's symptoms with some new digital pitting during the colder weather but is very proactive with coats gloves and hot hands heating packs.  Continuing to have good GI symptom improvement with the Creon.  Overall she feels her mood and body aches are just overall worse and feels very down which she states has come and gone periodically  in previous months.     Previous HPI 05/25/20 Remonia Otte is a 53 y.o. female with history of carotid artery aneurysm and centrilobular emphysema here for evaluation of rheumatoid arthritis currently on methotrexate and orencia infusion. 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 She was first evaluated with rheumatology around 2014 due to hand pain, stiffness, skin changes, raynaud's, telangiectasias but not started any long term treatments until in 2018 with worsening symptoms was diagnosed with RA. She takes methotrexate and hydroxychloroquine for over a year without significant improvement in symptoms, mostly only feeling improvement when on low dose prednisone. Enbrel was added to her treatment but she does not feel this made a particularly good benefit in symptoms. She start Orencia infusion IV since about 2 months ago and  also has not seen much improvement yet, although tolerating the treatment okay. She does not have much joint swelling most of the time, although in the past when she stopped all medicines for a time large bilateral knee swelling occurred and also hand swelling involving the whole hand and fingers. Doing okay today, currently on 10 mg prednisone she restarted taking it at 20 mg dose and decreased it feeling a bit worse but currently much better than off the medicine.   She had extensive cardiac workup in December with abnormal EKG and subsequent TTE without any obvious problems and cardiac CT checked with no coronary disease problems seen. She was also found to have carotid artery aneurysm during evaluation for vagal nerve stimulator placement as a participant with RESET-RA trial.   She has skin thickening and Raynaud's numerous telangiectasias on hands bilaterally.  She is not currently on any medicine for this.  She has a few areas of small pitting or scarring.  She previously did not try calcium channel blocker due to concern of side effects with a fairly low baseline blood pressure.  She has chronic diarrhea symptoms has had colonoscopy that was unremarkable.  She is also had swallowing study but does not describe prior upper GI endoscopy.   She has pulmonary emphysema but with good function on PFTs and is following up regularly with pulmonology.  This has been attributed to prior smoking history which she has to reduce but not stopped entirely.  She does have pretty frequent shortness of breath with exertion but no history of hypoxia or abnormal stress testing or PFTs.  She does not notice significant peripheral edema.   Labs reviewed 04/2016 ANA 1:1280 centromere RF 16.3 CCP 117   Review of Systems  Constitutional:  Positive for fatigue.  HENT:  Positive for mouth dryness. Negative for mouth sores.   Eyes:  Positive for dryness.  Respiratory:  Positive for shortness of breath.    Cardiovascular:  Negative for chest pain and palpitations.  Gastrointestinal:  Negative for blood in stool, constipation and diarrhea.  Endocrine: Negative for increased urination.  Genitourinary:  Negative for involuntary urination.  Musculoskeletal:  Positive for joint pain, joint pain, joint swelling and morning stiffness. Negative for gait problem, myalgias, muscle weakness, muscle tenderness and myalgias.  Skin:  Positive for color change. Negative for rash, hair loss and sensitivity to sunlight.  Allergic/Immunologic: Negative for susceptible to infections.  Neurological:  Negative for dizziness and headaches.  Hematological:  Negative for swollen glands.  Psychiatric/Behavioral:  Negative for depressed mood and sleep disturbance. The patient is not nervous/anxious.     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   GERD (gastroesophageal reflux disease) 07/06/2020   High risk medication use 05/25/2020   Vitamin D deficiency 05/25/2020   S/P coil embolization of cerebral aneurysm 02/28/2020   Centrilobular emphysema (HCC) 08/04/2019   Chronic fatigue 05/10/2019   Brain fog 05/10/2019   Myofascial pain 06/30/2018   Rheumatoid arthritis (HCC) 05/20/2017   Bilateral carpal tunnel syndrome, left cubital tunnel syndrome 08/17/2013   Arthritis of left carpometacarpal joint 02/26/2013   SOB (shortness of breath) 02/26/2013   ANEMIA, HX OF 01/10/2010   URINARY URGENCY 12/21/2009   Enlarged lymph nodes 08/15/2008    Past Medical History:  Diagnosis Date   Aneurysm (HCC) 02/2020   Aortic atherosclerosis (HCC)    CREST (calcinosis, Raynaud's phenomenon, esophageal  dysfunction, sclerodactyly, telangiectasia) (HCC) 2022   Emphysema of lung (HCC)    GERD (gastroesophageal reflux disease)    IBS (irritable bowel syndrome)    Irritable bowel syndrome with diarrhea    Pancreatic divisum    Raynaud disease    Rheumatoid arthritis (HCC)    Sessile colonic polyp     Family History  Problem Relation Age of Onset   Bipolar disorder Mother    Lung cancer Mother    Lung cancer Father    Gout Father    Healthy Sister    Lupus Sister    Arthritis/Rheumatoid Maternal Grandmother    Stomach cancer Paternal Grandmother    Healthy Brother    Colon cancer Neg Hx    Esophageal cancer Neg Hx    Colon polyps Neg Hx    Rectal cancer Neg Hx    Breast cancer Neg Hx    Past Surgical History:  Procedure Laterality Date   ANEURYSM COILING  02/2020   ELBOW SURGERY Left    FINGER ARTHROSCOPY WITH CARPOMETACARPEL ([facility]) ARTHROPLASTY Left 03/2019   LUMBAR SPINE SURGERY  2001   L4-5-S1   TONSILLECTOMY  1990   ULNAR TUNNEL RELEASE Left 12/12/2022   VAGUS NERVE STIMULATOR INSERTION  09/2019   Social History   Social History Narrative   No exercise. + daily caffeine.    Immunization History  Administered Date(s) Administered   Influenza Whole 01/10/2010   Tdap 01/11/2013, 03/27/2023     Objective: Vital Signs: BP 118/74 (BP Location: Right Arm, Patient Position: Sitting, Cuff Size: Normal)   Pulse 91   Resp 14   Ht 5\' 4"  (1.626 m)   Wt 127 lb (57.6 kg)   BMI 21.80 kg/m    Physical Exam Cardiovascular:     Rate and Rhythm: Normal rate and regular rhythm.  Pulmonary:     Effort: Pulmonary effort is normal.     Breath sounds: Normal breath sounds.  Skin:    General: Skin is warm and dry.     Comments: Skin thickening over fingers of both hands distal to MCPs Numerous fingertip and palmar telangiectasias Faint scabbing under nail on right second digit  Neurological:     Mental Status: She is alert.  Psychiatric:        Mood and Affect: Mood  normal.      Musculoskeletal Exam:  Shoulders full ROM no tenderness or swelling Left elbow tenderness at medial epicondyle without palpable swelling or radiation  Wrists full ROM no tenderness or swelling Fingers full ROM no tenderness or swelling Knees full ROM no tenderness or swelling Ankles full ROM no tenderness or swelling   Investigation: No additional findings.  Imaging: No results found.  Recent Labs: Lab Results  Component Value Date   WBC 14.8 (H) 10/02/2022   HGB 14.0 10/02/2022   PLT 326 10/02/2022   NA 142 03/27/2023   K 4.2 03/27/2023   CL 105 03/27/2023   CO2 24 03/27/2023   GLUCOSE 94 03/27/2023   BUN 15 03/27/2023   CREATININE 0.72 03/27/2023   BILITOT 0.3 03/27/2023   ALKPHOS 50 03/27/2023   AST 54 (H) 03/27/2023   ALT 72 (H) 03/27/2023   PROT 6.2 03/27/2023   ALBUMIN 4.4 03/27/2023   CALCIUM 9.4 03/27/2023   GFRAA 102 09/06/2020   QFTBGOLDPLUS NEGATIVE 12/31/2021    Speciality Comments: PLQ Eye Exam 08/20/2022 normal [place] f/u 1 year  Procedures:  No procedures performed Allergies: Patient has no known allergies.   Assessment / Plan:     Visit Diagnoses: Rheumatoid arthritis involving both hands with positive rheumatoid factor (HCC) - Plan: Sedimentation rate Morning stiffness and swelling in hands. Elevated liver enzymes potentially due to Methotrexate use, which has been a previous concern. Also incomplete response to treatment. - Hold Methotrexate and recheck liver enzymes in two weeks. - Consider starting Azathioprine after Methotrexate washout period if liver enzymes improve. -Continue Orencia 125 mg subcu weekly -Continue hydroxychloroquine 200 mg daily  High risk medication use - Orencia 125 mg subcu weekly hydroxychloroquine 200 mg daily methotrexate 10 mg p.o. weekly folic acid 1 mg daily. PLQ Eye Exam 08/20/2022 normal - Plan: Hepatic function panel, Thiopurine methyltransferase(tpmt)rbc, QuantiFERON-TB Gold Plus -Checking labs  including hepatic function panel, TPMT, annual QuantiFERON screening for medication monitoring plans for about 2 weeks after holding methotrexate  Pancreatic insufficiency - Viberzi supplementation  Limited scleroderma (HCC)  Low back pain, unspecified back pain laterality, unspecified chronicity, unspecified whether sciatica present Myofascial pain - Plan: cyclobenzaprine (FLEXERIL) 10 MG tablet, CK Reports of sciatic pain, particularly in the mornings. - Trial of Cyclobenzaprine (Flexeril) for overnight relief.  Ulnar Nerve Entrapment Persistent numbness in fingers despite previous surgical release. New onset of elbow pain and tenderness over the medial epicondyle. Scheduled for neuroplasty or transposition of ulnar nerve and tendon lengthening. - Proceed with scheduled surgery on 04/10/2023.   Orders: Orders Placed This Encounter  Procedures   Sedimentation rate   CK   Hepatic function panel   Thiopurine methyltransferase(tpmt)rbc   QuantiFERON-TB Gold Plus   Meds ordered this encounter  Medications   cyclobenzaprine (FLEXERIL) 10 MG tablet    Sig: Take 1 tablet (10 mg total) by mouth at bedtime as needed (Back pain).    Dispense:  30 tablet    Refill:  0     Follow-Up Instructions: Return in about 2 months (around 06/02/2023) for RA/CREST on ABA/HCQ/?AZA f/u 2mos.   Fuller Plan, MD  Note - This record has been created using [employer].  Chart creation errors have been sought, but may not always  have been located. Such creation errors do not reflect on  the standard of medical care.

## 2023-03-24 ENCOUNTER — Other Ambulatory Visit: Payer: Self-pay | Admitting: Internal Medicine

## 2023-03-24 DIAGNOSIS — M0579 Rheumatoid arthritis with rheumatoid factor of multiple sites without organ or systems involvement: Secondary | ICD-10-CM

## 2023-03-24 NOTE — Telephone Encounter (Signed)
 Last Fill: 01/06/2023  Labs: 10/02/2022 Glucose 120 WBC 14.8 Neutro Abs 12,521 Absolute Monocytes 178  TB Gold: 12/31/2021 Negative   Next Visit: 04/04/2023  Last Visit: 01/02/2023  IK:Myzlfjunpi arthritis involving both hands with positive rheumatoid factor   Current Dose per office note 01/02/2023: Orencia  125 mg subcu weekly   Patient to update labs and TB Gold at upcomming follow up.   Okay to refill Orencia ?

## 2023-03-25 ENCOUNTER — Telehealth: Payer: Self-pay | Admitting: Pharmacist

## 2023-03-25 ENCOUNTER — Other Ambulatory Visit (HOSPITAL_COMMUNITY): Payer: Self-pay

## 2023-03-25 NOTE — Telephone Encounter (Signed)
 Received fax from [facility] that they are unable to locate patient's insurance.  ATC patient - unable to reach. Left VM requesting she call pharmacy or clinic with insurance information to that Orencia  SQ rx can be processed.  Sherry Pennant, PharmD, MPH, BCPS, CPP Clinical Pharmacist (Rheumatology and Pulmonology)

## 2023-03-26 ENCOUNTER — Encounter: Payer: Self-pay | Admitting: Internal Medicine

## 2023-03-27 ENCOUNTER — Encounter: Payer: Self-pay | Admitting: Family Medicine

## 2023-03-27 ENCOUNTER — Ambulatory Visit (INDEPENDENT_AMBULATORY_CARE_PROVIDER_SITE_OTHER): Payer: Commercial Managed Care - PPO | Admitting: Family Medicine

## 2023-03-27 VITALS — BP 118/50 | HR 79 | Ht 64.0 in | Wt 124.0 lb

## 2023-03-27 DIAGNOSIS — Z0000 Encounter for general adult medical examination without abnormal findings: Secondary | ICD-10-CM | POA: Diagnosis not present

## 2023-03-27 DIAGNOSIS — R4189 Other symptoms and signs involving cognitive functions and awareness: Secondary | ICD-10-CM | POA: Diagnosis not present

## 2023-03-27 DIAGNOSIS — M341 CR(E)ST syndrome: Secondary | ICD-10-CM

## 2023-03-27 DIAGNOSIS — Z23 Encounter for immunization: Secondary | ICD-10-CM | POA: Diagnosis not present

## 2023-03-27 DIAGNOSIS — R200 Anesthesia of skin: Secondary | ICD-10-CM

## 2023-03-27 MED ORDER — GABAPENTIN 300 MG PO CAPS: 300.0000 mg | ORAL_CAPSULE | Freq: Every day | ORAL | 1 refills | Status: DC

## 2023-03-27 MED ORDER — AMPHETAMINE-DEXTROAMPHETAMINE 15 MG PO TABS: 15.0000 mg | ORAL_TABLET | Freq: Two times a day (BID) | ORAL | 0 refills | Status: DC

## 2023-03-27 NOTE — Assessment & Plan Note (Signed)
Always with Dr. Dimple Casey, Dr. Jerrol Banana, and pulmonology.

## 2023-03-27 NOTE — Progress Notes (Signed)
Complete physical exam  Patient: Dawn Griffin   DOB: 1970-05-25   53 y.o. Female  MRN: [id number]  Subjective:    Chief Complaint  Patient presents with   Annual Exam    Dawn Griffin is a 53 y.o. female who presents today for a complete physical exam. She reports consuming a general diet. The patient does not participate in regular exercise at present. She generally feels fair. She reports sleeping fair, was sleeping much better when she was taking the gabapentin. She does not have additional problems to discuss today.   She was taking the gabapentin primarily to help with the numbness in her fingers after surgery on her left elbow.  She recently ran out and noticed that she was actually resting and sleeping a lot better when she was taking it.   Most recent fall risk assessment:    03/27/2023    3:13 PM  Fall Risk   Falls in the past year? 0  Number falls in past yr: 0  Injury with Fall? 0  Risk for fall due to : No Fall Risks  Follow up Falls evaluation completed     Most recent depression screenings:    03/27/2023    3:14 PM 12/26/2021   10:45 AM  PHQ 2/9 Scores  PHQ - 2 Score 0 0        Patient Care Team: Agapito Games, MD as PCP - General Stefanie Libel Madolyn Frieze, MD (Dermatology) Pollyann Savoy, MD as Consulting Physician (Rheumatology) Monica Becton, MD as Consulting Physician (Sports Medicine) Cox, Ronnald Collum, MD as Referring Physician (Nurse Practitioner)   Outpatient Medications Prior to Visit  Medication Sig   DULoxetine (CYMBALTA) 30 MG capsule Take 1 capsule (30 mg total) by mouth daily.   Eluxadoline (VIBERZI) 75 MG TABS Take 1 tablet (75 mg total) by mouth 2 (two) times daily.   famotidine (PEPCID) 20 MG tablet Take 20 mg by mouth 2 (two) times daily.   folic acid (FOLVITE) 1 MG tablet TAKE 1 TABLET BY MOUTH DAILY   hydroxychloroquine (PLAQUENIL) 200 MG tablet TAKE 1 TABLET BY MOUTH DAILY   methotrexate (RHEUMATREX) 2.5 MG tablet Take  4 tablets (10 mg total) by mouth once a week. Caution:Chemotherapy. Protect from light.   Multiple Vitamin (MULTI-VITAMIN) tablet Take 1 tablet by mouth daily.   ORENCIA CLICKJECT 125 MG/ML SOAJ INJECT 1 PEN SUBCUTANEOUSLY  WEEKLY   [DISCONTINUED] amphetamine-dextroamphetamine (ADDERALL) 15 MG tablet Take 1 tablet by mouth 2 (two) times daily.   [DISCONTINUED] gabapentin (NEURONTIN) 300 MG capsule Take 1 capsule (300 mg total) by mouth at bedtime.   [DISCONTINUED] predniSONE (DELTASONE) 2.5 MG tablet Take 3 tablets by mouth daily.   No facility-administered medications prior to visit.    ROS        Objective:     BP (!) 118/50   Pulse 79   Ht 5\' 4"  (1.626 m)   Wt 124 lb (56.2 kg)   SpO2 100%   BMI 21.28 kg/m    Physical Exam Vitals and nursing note reviewed.  Constitutional:      Appearance: Normal appearance.  HENT:     Head: Normocephalic and atraumatic.     Right Ear: Tympanic membrane, ear canal and external ear normal.     Left Ear: Tympanic membrane, ear canal and external ear normal.     Nose: Nose normal.     Mouth/Throat:     Pharynx: Oropharynx is clear.  Eyes:     Extraocular  Movements: Extraocular movements intact.     Conjunctiva/sclera: Conjunctivae normal.     Pupils: Pupils are equal, round, and reactive to light.  Neck:     Thyroid: No thyromegaly.  Cardiovascular:     Rate and Rhythm: Normal rate and regular rhythm.  Pulmonary:     Effort: Pulmonary effort is normal.     Breath sounds: Normal breath sounds.  Abdominal:     General: Bowel sounds are normal.     Palpations: Abdomen is soft.     Tenderness: There is no abdominal tenderness.  Musculoskeletal:        General: No swelling.     Cervical back: Neck supple.  Skin:    General: Skin is warm and dry.     Comments: Scattered angiomas particularly on her palms.  Thickened skin on her hands with some blanching particularly around the knuckles.  Neurological:     Mental Status: She is  alert and oriented to person, place, and time.  Psychiatric:        Mood and Affect: Mood normal.        Behavior: Behavior normal.      No results found for any visits on 03/27/23.     Assessment & Plan:    Routine Health Maintenance and Physical Exam  Immunization History  Administered Date(s) Administered   Influenza Whole 01/10/2010   Tdap 01/11/2013, 03/27/2023    Health Maintenance  Topic Date Due   Pneumococcal Vaccine 48-42 Years old (1 of 2 - PCV) Never done   HIV Screening  Never done   Zoster Vaccines- Shingrix (1 of 2) Never done   COVID-19 Vaccine (1) 04/12/2023 (Originally 10/27/1975)   INFLUENZA VACCINE  06/09/2023 (Originally 10/10/2022)   Colonoscopy  11/09/2023   Cervical Cancer Screening (HPV/Pap Cotest)  01/27/2024   MAMMOGRAM  08/11/2024   DTaP/Tdap/Td (3 - Td or Tdap) 03/26/2033   Hepatitis C Screening  Completed   HPV VACCINES  Aged Out    Discussed health benefits of physical activity, and encouraged her to engage in regular exercise appropriate for her age and condition.  Problem List Items Addressed This Visit       Cardiovascular and Mediastinum   CREST syndrome (HCC)   Always with Dr. Dimple Casey, Dr. Jerrol Banana, and pulmonology.        Other   Brain fog   Relevant Medications   amphetamine-dextroamphetamine (ADDERALL) 15 MG tablet   Bilateral carpal tunnel syndrome, left cubital tunnel syndrome   Relevant Medications   gabapentin (NEURONTIN) 300 MG capsule   Other Visit Diagnoses       Routine general medical examination at a health care facility    -  Primary   Relevant Medications   amphetamine-dextroamphetamine (ADDERALL) 15 MG tablet   Other Relevant Orders   CMP14+EGFR   Lipid panel     Encounter for immunization       Relevant Orders   Tdap vaccine greater than or equal to 7yo IM (Completed)       Keep up a regular exercise program and make sure you are eating a healthy diet Try to eat 4 servings of dairy a day, or if you are  lactose intolerant take a calcium with vitamin D daily.  Your vaccines are up to date.  Discussed Prevnar and shingles vaccine. Encouraged her to think about it .   Return in about 1 year (around 03/26/2024) for Wellness Exam.     Dawn Gasser, MD

## 2023-03-28 LAB — CMP14+EGFR
ALT: 72 [IU]/L — ABNORMAL HIGH (ref 0–32)
AST: 54 [IU]/L — ABNORMAL HIGH (ref 0–40)
Albumin: 4.4 g/dL (ref 3.9–4.9)
Alkaline Phosphatase: 50 [IU]/L (ref 44–121)
BUN/Creatinine Ratio: 21 (ref 9–23)
BUN: 15 mg/dL (ref 6–24)
Bilirubin Total: 0.3 mg/dL (ref 0.0–1.2)
CO2: 24 mmol/L (ref 20–29)
Calcium: 9.4 mg/dL (ref 8.7–10.2)
Chloride: 105 mmol/L (ref 96–106)
Creatinine, Ser: 0.72 mg/dL (ref 0.57–1.00)
Globulin, Total: 1.8 g/dL (ref 1.5–4.5)
Glucose: 94 mg/dL (ref 70–99)
Potassium: 4.2 mmol/L (ref 3.5–5.2)
Sodium: 142 mmol/L (ref 134–144)
Total Protein: 6.2 g/dL (ref 6.0–8.5)
eGFR: 102 mL/min/{1.73_m2} (ref >59)

## 2023-03-28 LAB — LIPID PANEL
Chol/HDL Ratio: 3 {ratio} (ref 0.0–4.4)
Cholesterol, Total: 198 mg/dL (ref 100–199)
HDL: 67 mg/dL (ref >39)
LDL Chol Calc (NIH): 111 mg/dL — ABNORMAL HIGH (ref 0–99)
Triglycerides: 113 mg/dL (ref 0–149)
VLDL Cholesterol Cal: 20 mg/dL (ref 5–40)

## 2023-03-28 NOTE — Progress Notes (Signed)
Hi Dawn Griffin, your AST and ALT liver enzymes are slightly elevated I am not sure if these were a little elevated the last time you had blood work with one of your specialists.  Your LDL is just mildly elevated but not in a range that we would consider medication just continue to work on healthy food choices.

## 2023-04-01 ENCOUNTER — Telehealth: Payer: Self-pay | Admitting: Family Medicine

## 2023-04-01 MED ORDER — OSELTAMIVIR PHOSPHATE 75 MG PO CAPS
75.0000 mg | ORAL_CAPSULE | Freq: Every day | ORAL | 0 refills | Status: DC
Start: 2023-04-01 — End: 2023-04-23

## 2023-04-01 NOTE — Telephone Encounter (Signed)
Daughter tested positive for the flu today here in our office.  Mom was with her.  Treated prophylactically with Tamiflu daily for 10 days.  Meds ordered this encounter  Medications   oseltamivir (TAMIFLU) 75 MG capsule    Sig: Take 1 capsule (75 mg total) by mouth daily.    Dispense:  10 capsule    Refill:  0

## 2023-04-02 MED ORDER — ORENCIA CLICKJECT 125 MG/ML ~~LOC~~ SOAJ: SUBCUTANEOUS | 0 refills | Status: DC

## 2023-04-02 NOTE — Addendum Note (Signed)
Addended by: Henriette Combs on: 04/02/2023 03:44 PM   Modules accepted: Orders

## 2023-04-02 NOTE — Telephone Encounter (Signed)
Patient contacted the office again. Advised the patient the Dub Amis has been sent to [facility]. Advised the patient it appears the order has not been released to the pharmacy yet. Advised the patient I would message the pharmacy team about it. Please advise.

## 2023-04-02 NOTE — Telephone Encounter (Signed)
Orencia rx has been released to [facility] today.  Chesley Mires, PharmD, MPH, BCPS, CPP Clinical Pharmacist (Rheumatology and Pulmonology)

## 2023-04-04 ENCOUNTER — Encounter: Payer: Self-pay | Admitting: Internal Medicine

## 2023-04-04 ENCOUNTER — Ambulatory Visit: Payer: Commercial Managed Care - PPO | Attending: Internal Medicine | Admitting: Internal Medicine

## 2023-04-04 VITALS — BP 118/74 | HR 91 | Resp 14 | Ht 64.0 in | Wt 127.0 lb

## 2023-04-04 DIAGNOSIS — Z79899 Other long term (current) drug therapy: Secondary | ICD-10-CM | POA: Diagnosis not present

## 2023-04-04 DIAGNOSIS — M7918 Myalgia, other site: Secondary | ICD-10-CM

## 2023-04-04 DIAGNOSIS — M05741 Rheumatoid arthritis with rheumatoid factor of right hand without organ or systems involvement: Secondary | ICD-10-CM

## 2023-04-04 DIAGNOSIS — M545 Low back pain, unspecified: Secondary | ICD-10-CM

## 2023-04-04 DIAGNOSIS — M349 Systemic sclerosis, unspecified: Secondary | ICD-10-CM | POA: Diagnosis not present

## 2023-04-04 DIAGNOSIS — K8689 Other specified diseases of pancreas: Secondary | ICD-10-CM | POA: Diagnosis not present

## 2023-04-04 DIAGNOSIS — M05742 Rheumatoid arthritis with rheumatoid factor of left hand without organ or systems involvement: Secondary | ICD-10-CM

## 2023-04-04 DIAGNOSIS — Z9889 Other specified postprocedural states: Secondary | ICD-10-CM

## 2023-04-04 MED ORDER — CYCLOBENZAPRINE HCL 10 MG PO TABS: 10.0000 mg | ORAL_TABLET | Freq: Every evening | ORAL | 0 refills | Status: DC | PRN

## 2023-04-04 NOTE — Telephone Encounter (Signed)
Advised the patient the Dub Amis was sent to [facility] and we have received a receipt from the pharmacy. Advised the patient to contact the pharmacy and gave her the phone number. Patient verbalized understanding.

## 2023-04-10 HISTORY — PX: ULNAR NERVE TRANSPOSITION: SHX2595

## 2023-04-16 NOTE — Telephone Encounter (Signed)
 Dawn Griffin [phone] needs supporting documentation that patient has tried and failed Humira or biosimular Yusimry. Fax [phone].

## 2023-04-17 ENCOUNTER — Other Ambulatory Visit: Payer: Self-pay | Admitting: *Deleted

## 2023-04-17 DIAGNOSIS — Z79899 Other long term (current) drug therapy: Secondary | ICD-10-CM

## 2023-04-17 DIAGNOSIS — M7918 Myalgia, other site: Secondary | ICD-10-CM

## 2023-04-17 DIAGNOSIS — M05741 Rheumatoid arthritis with rheumatoid factor of right hand without organ or systems involvement: Secondary | ICD-10-CM

## 2023-04-17 DIAGNOSIS — M545 Low back pain, unspecified: Secondary | ICD-10-CM

## 2023-04-21 ENCOUNTER — Telehealth: Payer: Self-pay | Admitting: *Deleted

## 2023-04-21 NOTE — Telephone Encounter (Signed)
 Address in phone note dated today

## 2023-04-21 NOTE — Telephone Encounter (Signed)
 Returned call to [employer] - advised that patient has been stable on Orencia  since 2022 and patient is unable to try adalimumab due to previous non-response to Enbrel. Disease response to any treatment switch does not guarantee and places patient at risk for disease flare, significant pain, and ED visit  Rep states that they have to take case to account manager. Will reach out if anything further is needed. They do see paid claims on their end  Geraldene Kleine, PharmD, MPH, BCPS, CPP Clinical Pharmacist (Rheumatology and Pulmonology)

## 2023-04-21 NOTE — Telephone Encounter (Signed)
 Dawn Griffin with [facility] [phone] needs supporting documentation that patient has tried and failed Humira or biosimular Yusimry.  Or if she has not tried if the provider would be willing to switch patient. Fax [phone].  Requesting a call back ASAP and states this is urgent.

## 2023-04-22 ENCOUNTER — Encounter: Payer: Self-pay | Admitting: Internal Medicine

## 2023-04-23 ENCOUNTER — Encounter: Payer: Self-pay | Admitting: Internal Medicine

## 2023-04-23 ENCOUNTER — Ambulatory Visit: Payer: Commercial Managed Care - PPO | Admitting: Nurse Practitioner

## 2023-04-23 ENCOUNTER — Encounter: Payer: Self-pay | Admitting: Nurse Practitioner

## 2023-04-23 ENCOUNTER — Other Ambulatory Visit: Payer: Self-pay | Admitting: *Deleted

## 2023-04-23 VITALS — BP 104/64 | HR 94 | Ht 64.0 in | Wt 126.4 lb

## 2023-04-23 DIAGNOSIS — K580 Irritable bowel syndrome with diarrhea: Secondary | ICD-10-CM

## 2023-04-23 DIAGNOSIS — K219 Gastro-esophageal reflux disease without esophagitis: Secondary | ICD-10-CM

## 2023-04-23 DIAGNOSIS — Z860101 Personal history of adenomatous and serrated colon polyps: Secondary | ICD-10-CM

## 2023-04-23 DIAGNOSIS — Z860100 Personal history of colon polyps, unspecified: Secondary | ICD-10-CM

## 2023-04-23 LAB — QUANTIFERON-TB GOLD PLUS
Mitogen-NIL: >10 [IU]/mL
NIL: 0.02 [IU]/mL
QuantiFERON-TB Gold Plus: NEGATIVE
TB1-NIL: 0 [IU]/mL
TB2-NIL: 0 [IU]/mL

## 2023-04-23 LAB — HEPATIC FUNCTION PANEL
AG Ratio: 2.3 (calc) (ref 1.0–2.5)
ALT: 55 U/L — ABNORMAL HIGH (ref 6–29)
AST: 38 U/L — ABNORMAL HIGH (ref 10–35)
Albumin: 4.3 g/dL (ref 3.6–5.1)
Alkaline phosphatase (APISO): 50 U/L (ref 37–153)
Bilirubin, Direct: 0.1 mg/dL (ref 0.0–0.2)
Globulin: 1.9 g/dL (ref 1.9–3.7)
Indirect Bilirubin: 0.2 mg/dL (ref 0.2–1.2)
Total Bilirubin: 0.3 mg/dL (ref 0.2–1.2)
Total Protein: 6.2 g/dL (ref 6.1–8.1)

## 2023-04-23 LAB — CK: Total CK: 50 U/L (ref 21–240)

## 2023-04-23 LAB — THIOPURINE METHYLTRANSFERASE (TPMT), RBC: Thiopurine Methyltransferase, RBC: 17 nmol/h/mL

## 2023-04-23 LAB — SEDIMENTATION RATE: Sed Rate: 2 mm/h (ref 0–20)

## 2023-04-23 MED ORDER — VIBERZI 75 MG PO TABS: 75.0000 mg | ORAL_TABLET | Freq: Every day | ORAL | 4 refills | Status: DC

## 2023-04-23 NOTE — Patient Instructions (Addendum)
Acid Reflux  Below are some measures you can take to hopefully improve acid reflux symptoms . We may have discussed some of these today in the office. Not everything on this list may apply to you   --If you are taking anti-reflux ( GERD) medication be sure to take it 30 minutes to one hour before breakfast or dinner if you take it in the afternoon.  --Avoid late meals / bedtime snacks.   --Avoid trigger foods ( foods which you know tend to aggravate you reflux symptoms). Some common trigger foods include spicy foods, fatty foods, acidic foods, chocolate and caffeine.  --Elevate the head of bed 6-8 inches on blocks or bricks. If not able to elevate the head of the bed consider purchasing a wedge pillow to sleep on.     Let me know if reflux symptoms don't resolve. We can then increased Famotidine dose.   Let me know how to proceed with your Viberzi rx.  I appreciate the opportunity to care for you. Willette Cluster, NP

## 2023-04-23 NOTE — Progress Notes (Signed)
 ASSESSMENT    Brief Narrative:  53 y.o.  female known to Dr. Rhea Belton  with a past medical history not limited to RA, colon polyps, possible pancreatic insufficiency, IBS-D  Chronic diarrhea / IBS / possible pancreatic insuffiencey Pancreatic enzymes lost efficacy. She has been doing very well on Viberzi 75 mg , 1 every other day to daily.   History of colon polyps ( TA and sessile serrated) Aug 2022  GERD without Barrett's Recently having some breakthrough symptoms on Pepcid 20 mg BID  Rheumatoid arthritis on Orencia  See PMH for any additional medical & surgical history   PLAN   --Refill Viberzi 75 mg. Maintained on one every other day to once daily -- Continue famotidine 20 mg twice daily --Discussed anti-reflux measures such as avoidance of late meals / bedtime snacks,  avoidance of trigger foods. Information provided in AVS --Advised to let us know if GERD symptoms do not improve. We can increase Pepcid dose if needed.  --Three year surveillance colonoscopy Sept 2025   HPI   Chief complaint : IBS follow up  Dawn Griffin has chronic diarrhea. She has had a negative colonoscopy for microscopic biopsies. Her fecal elastase was low and had responded to pancreatic enzymes though response wasn't durable. She was started on Virberzi about 2 years ago and it has changed her life. Her stools are solid, has only occasional loose stool. She has been able to reduce the dose from 75 mg BID to about once every other day.   Dawn Griffin has a history of GERD. Symptoms usually managed well with  Pepcid 20 mg BID but over the last few days has had some breakthrough symptoms. No dysphagia. She has scleroderma. Marland Kitchen   GI History / Pertinent GI Studies   **All endoscopic studies may not be included here    Aug 2022 EGD for GERD symptoms - Z-line irregular, 39 cm from the incisors. Biopsied. - Acute gastritis. Biopsied. - Normal examined duodenum. Biopsied.  Aug 2022 Colonoscopy for chronic diarrhea  and history of polyps - The examined portion of the ileum was normal.  - One 5 mm polyp in the ascending colon, removed  - One 15 mm polyp in the ascending colon, removed  - One 6 mm polyp in the sigmoid colon, removed  - Diverticulosis in the sigmoid colon.  - The distal rectum and anal verge are normal on retroflexion view. -Otherwise normal colonic mucosa with no evidence of colitis. Biopsies were taken with a cold forceps from the right colon and left colon for evaluation of microscopic colitis.  Diagnosis 1. Surgical [P], duodenal/small bowel bx - DUODENAL MUCOSA WITH NO SPECIFIC HISTOPATHOLOGIC CHANGES - NEGATIVE FOR INCREASED INTRAEPITHELIAL LYMPHOCYTES OR VILLOUS ARCHITECTURAL CHANGES 2. Surgical [P], gastric antrum and gastric body - GASTRIC ANTRAL AND OXYNTIC MUCOSA WITH NONSPECIFIC REACTIVE GASTROPATHY - WARTHIN STARRY STAIN IS NEGATIVE FOR HELICOBACTER PYLORI 3. Surgical [P], GE junction - ESOPHAGEAL SQUAMOUS AND CARDIAC MUCOSA WITH NO SPECIFIC HISTOPATHOLOGIC CHANGES - NEGATIVE FOR INTESTINAL METAPLASIA OR DYSPLASIA 4. Surgical [P], colon nos, random sites - COLONIC MUCOSA WITH NO SPECIFIC HISTOPATHOLOGIC CHANGES - NEGATIVE FOR ACUTE INFLAMMATION, INCREASED INTRAEPITHELIAL LYMPHOCYTES OR THICKENED SUBEPITHELIAL COLLAGEN TABLE 5. Surgical [P], colon, ascending, polyps x 2, polyp (2) - TUBULAR ADENOMA WITHOUT HIGH-GRADE DYSPLASIA OR MALIGNANCY - SESSILE SERRATED POLYP(S) WITHOUT CYTOLOGIC DYSPLASIA 6. Surgical [P], colon, sigmoid x1, polyp (1) - HYPERPLASTIC POLYP     Latest Ref Rng & Units 04/17/2023    8:58 AM 03/27/2023  3:49 PM 10/02/2022   11:53 AM  Hepatic Function  Total Protein 6.1 - 8.1 g/dL 6.2  6.2  7.0   Albumin 3.9 - 4.9 g/dL  4.4    AST 10 - 35 U/L 38  54  19   ALT 6 - 29 U/L 55  72  20   Alk Phosphatase 44 - 121 IU/L  50    Total Bilirubin 0.2 - 1.2 mg/dL 0.3  0.3  0.3   Bilirubin, Direct 0.0 - 0.2 mg/dL 0.1          Latest Ref Rng & Units 10/02/2022    11:53 AM 07/03/2022   11:47 AM 12/31/2021   11:46 AM  CBC  WBC 3.8 - 10.8 Thousand/uL 14.8  9.9  8.3   Hemoglobin 11.7 - 15.5 g/dL 81.1  91.4  78.2   Hematocrit 35.0 - 45.0 % 42.3  40.5  38.2   Platelets 140 - 400 Thousand/uL 326  218  237      Past Medical History:  Diagnosis Date   Aneurysm (HCC) 02/2020   Aortic atherosclerosis (HCC)    CREST (calcinosis, Raynaud's phenomenon, esophageal dysfunction, sclerodactyly, telangiectasia) (HCC) 2022   Emphysema of lung (HCC)    GERD (gastroesophageal reflux disease)    IBS (irritable bowel syndrome)    Irritable bowel syndrome with diarrhea    Pancreatic divisum    Raynaud disease    Rheumatoid arthritis (HCC)    Sessile colonic polyp     Past Surgical History:  Procedure Laterality Date   ANEURYSM COILING  02/2020   ELBOW SURGERY Left    FINGER ARTHROSCOPY WITH CARPOMETACARPEL ([facility]) ARTHROPLASTY Left 03/2019   LUMBAR SPINE SURGERY  2001   L4-5-S1   TONSILLECTOMY  1990   ULNAR NERVE TRANSPOSITION Left 04/10/2023   ULNAR TUNNEL RELEASE Left 12/12/2022   VAGUS NERVE STIMULATOR INSERTION  09/2019    Family History  Problem Relation Age of Onset   Bipolar disorder Mother    Lung cancer Mother    Lung cancer Father    Gout Father    Healthy Sister    Lupus Sister    Arthritis/Rheumatoid Maternal Grandmother    Stomach cancer Paternal Grandmother    Healthy Brother    Colon cancer Neg Hx    Esophageal cancer Neg Hx    Colon polyps Neg Hx    Rectal cancer Neg Hx    Breast cancer Neg Hx     Current Medications, Allergies, Family History and Social History were reviewed in [employer] electronic medical record.     Current Outpatient Medications  Medication Sig Dispense Refill   Abatacept (ORENCIA CLICKJECT) 125 MG/ML SOAJ INJECT 1 PEN SUBCUTANEOUSLY  WEEKLY 4 mL 0   amphetamine-dextroamphetamine (ADDERALL) 15 MG tablet Take 1 tablet by mouth 2 (two) times daily. 180 tablet 0   cyclobenzaprine (FLEXERIL) 10 MG  tablet Take 1 tablet (10 mg total) by mouth at bedtime as needed (Back pain). 30 tablet 0   DULoxetine (CYMBALTA) 30 MG capsule Take 1 capsule (30 mg total) by mouth daily. 90 capsule 0   Eluxadoline (VIBERZI) 75 MG TABS Take 1 tablet (75 mg total) by mouth 2 (two) times daily. 180 tablet 1   famotidine (PEPCID) 20 MG tablet Take 20 mg by mouth 2 (two) times daily.     folic acid (FOLVITE) 1 MG tablet TAKE 1 TABLET BY MOUTH DAILY 90 tablet 3   gabapentin (NEURONTIN) 300 MG capsule Take 1 capsule (300 mg total)  by mouth at bedtime. 90 capsule 1   hydroxychloroquine (PLAQUENIL) 200 MG tablet TAKE 1 TABLET BY MOUTH DAILY 90 tablet 1   Multiple Vitamin (MULTI-VITAMIN) tablet Take 1 tablet by mouth daily.     No current facility-administered medications for this visit.    Review of Systems: No chest pain. No shortness of breath. No urinary complaints.    Physical Exam  Filed Weights   04/23/23 1431  Weight: 126 lb 6 oz (57.3 kg)   Wt Readings from Last 3 Encounters:  04/23/23 126 lb 6 oz (57.3 kg)  04/04/23 127 lb (57.6 kg)  03/27/23 124 lb (56.2 kg)    BP 104/64   Pulse 94   Ht 5\' 4"  (1.626 m)   Wt 126 lb 6 oz (57.3 kg)   BMI 21.69 kg/m  Constitutional:  Pleasant, generally well appearing female in no acute distress. Psychiatric: Normal mood and affect. Behavior is normal. EENT: Pupils normal.  Conjunctivae are normal. No scleral icterus. Neck supple.  Cardiovascular: Normal rate, regular rhythm.  Pulmonary/chest: Effort normal and breath sounds normal. No wheezing, rales or rhonchi. Abdominal: Soft, nondistended, nontender. Bowel sounds active throughout. There are no masses palpable. No hepatomegaly. Neurological: Alert and oriented to person place and time.    Willette Cluster, NP  04/23/2023, 2:54 PM

## 2023-04-30 ENCOUNTER — Telehealth: Payer: Self-pay | Admitting: Nurse Practitioner

## 2023-04-30 MED ORDER — VIBERZI 75 MG PO TABS: 75.0000 mg | ORAL_TABLET | Freq: Every day | ORAL | 3 refills | Status: DC

## 2023-04-30 NOTE — Progress Notes (Signed)
 Addendum: Reviewed and agree with assessment and management plan. Asha Grumbine, Carie Caddy, MD

## 2023-04-30 NOTE — Telephone Encounter (Signed)
 [facility] rep called and stated that this patient they received a faxed order for Viberzi and they actually need this prescription to be E-scribed. Please advise.

## 2023-04-30 NOTE — Telephone Encounter (Signed)
 Gunnar Fusi is off the next 3 days so I will route to Dr Rhea Belton to see if he can take care of this rx.

## 2023-04-30 NOTE — Telephone Encounter (Addendum)
 Rx for Viberzi has to be sent electronically.  It is printing because it has a form of a controlled substance in it.  Please send with Impravada (spelling? ) to [facility].    Thank you

## 2023-05-01 ENCOUNTER — Other Ambulatory Visit: Payer: Self-pay

## 2023-05-01 ENCOUNTER — Other Ambulatory Visit: Payer: Self-pay | Admitting: Internal Medicine

## 2023-05-01 DIAGNOSIS — M0579 Rheumatoid arthritis with rheumatoid factor of multiple sites without organ or systems involvement: Secondary | ICD-10-CM

## 2023-05-01 DIAGNOSIS — K580 Irritable bowel syndrome with diarrhea: Secondary | ICD-10-CM

## 2023-05-01 DIAGNOSIS — Z79899 Other long term (current) drug therapy: Secondary | ICD-10-CM

## 2023-05-01 MED ORDER — VIBERZI 75 MG PO TABS: 75.0000 mg | ORAL_TABLET | Freq: Every day | ORAL | 3 refills | Status: AC

## 2023-05-01 MED ORDER — VIBERZI 75 MG PO TABS: 75.0000 mg | ORAL_TABLET | Freq: Every day | ORAL | 3 refills | Status: DC

## 2023-05-01 NOTE — Telephone Encounter (Signed)
 I sent to [facility] pharmacy as requested It did not ask for my imprivada, so let me know the submission was not successful JMP

## 2023-05-01 NOTE — Telephone Encounter (Signed)
 Patient states she was supposed to be started on Azathioprine but has net seen a prescription at [facility] yet. Patient advised I would send a message to Dr. Dimple Casey. Per last lab note, We could go ahead and try starting the azathioprine 50 mg once daily. I do recommend waiting until afterwards if she has any additional surgery scheduled soon for the elbow. Patient state she has no additional surgeries coming up. Please review and sign if needed.

## 2023-05-01 NOTE — Telephone Encounter (Signed)
 Refill request received via fax from [facility] for Orencia.  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 Patient states she can go to a [facility] tomorrow and get it done. Patient also inquired about an Azathioprine prescription. Separate message will be sent regarding that.   Okay to refill Orencia?

## 2023-05-01 NOTE — Progress Notes (Signed)
 Refill of viberzi

## 2023-05-01 NOTE — Telephone Encounter (Signed)
 Dr Rhea Belton sent Rx to [facility].

## 2023-05-02 ENCOUNTER — Encounter: Payer: Self-pay | Admitting: Internal Medicine

## 2023-05-02 MED ORDER — ORENCIA CLICKJECT 125 MG/ML ~~LOC~~ SOAJ
SUBCUTANEOUS | 0 refills | Status: DC
Start: 2023-05-02 — End: 2023-05-27

## 2023-05-02 MED ORDER — AZATHIOPRINE 50 MG PO TABS: 50.0000 mg | ORAL_TABLET | Freq: Every day | ORAL | 1 refills | Status: DC

## 2023-05-03 LAB — CBC WITH DIFFERENTIAL/PLATELET
Basophils Absolute: 0.1 10*3/uL (ref 0.0–0.2)
Basos: 1 %
EOS (ABSOLUTE): 0.5 10*3/uL — ABNORMAL HIGH (ref 0.0–0.4)
Eos: 5 %
Hematocrit: 39.7 % (ref 34.0–46.6)
Hemoglobin: 13.5 g/dL (ref 11.1–15.9)
Immature Grans (Abs): 0 10*3/uL (ref 0.0–0.1)
Immature Granulocytes: 0 %
Lymphocytes Absolute: 4.5 10*3/uL — ABNORMAL HIGH (ref 0.7–3.1)
Lymphs: 44 %
MCH: 32.2 pg (ref 26.6–33.0)
MCHC: 34 g/dL (ref 31.5–35.7)
MCV: 95 fL (ref 79–97)
Monocytes Absolute: 0.6 10*3/uL (ref 0.1–0.9)
Monocytes: 5 %
Neutrophils Absolute: 4.5 10*3/uL (ref 1.4–7.0)
Neutrophils: 45 %
Platelets: 278 10*3/uL (ref 150–450)
RBC: 4.19 x10E6/uL (ref 3.77–5.28)
RDW: 11.8 % (ref 11.7–15.4)
WBC: 10.1 10*3/uL (ref 3.4–10.8)

## 2023-05-03 LAB — CMP14+EGFR
ALT: 45 IU/L — ABNORMAL HIGH (ref 0–32)
AST: 38 IU/L (ref 0–40)
Albumin: 4.2 g/dL (ref 3.9–4.9)
Alkaline Phosphatase: 63 IU/L (ref 44–121)
BUN/Creatinine Ratio: 16 (ref 9–23)
BUN: 11 mg/dL (ref 6–24)
Bilirubin Total: 0.3 mg/dL (ref 0.0–1.2)
CO2: 23 mmol/L (ref 20–29)
Calcium: 9.5 mg/dL (ref 8.7–10.2)
Chloride: 100 mmol/L (ref 96–106)
Creatinine, Ser: 0.69 mg/dL (ref 0.57–1.00)
Globulin, Total: 2.1 g/dL (ref 1.5–4.5)
Glucose: 99 mg/dL (ref 70–99)
Potassium: 4.5 mmol/L (ref 3.5–5.2)
Sodium: 138 mmol/L (ref 134–144)
Total Protein: 6.3 g/dL (ref 6.0–8.5)
eGFR: 106 mL/min/{1.73_m2} (ref >59)

## 2023-05-13 ENCOUNTER — Encounter: Payer: Self-pay | Admitting: Internal Medicine

## 2023-05-13 ENCOUNTER — Encounter: Payer: Self-pay | Admitting: Family Medicine

## 2023-05-13 DIAGNOSIS — R935 Abnormal findings on diagnostic imaging of other abdominal regions, including retroperitoneum: Secondary | ICD-10-CM

## 2023-05-14 NOTE — Telephone Encounter (Signed)
 Impression  Noncontrast screening protocol chest CT was performed. Radiation dose reduction was utilized (automated exposure control, mA or kV adjustment based on patient size, or iterative image reconstruction). 3-D maximal intensity projection images were constructed and reviewed, along with axial and 2-dimensional sagittal reformatted images.  COMPARISON:  Prior chest x-rays, most recent 09/02/2013  INDICATION: Lung cancer screening  Please note that in cases with numerous small nodules only the largest or most suspicious may be detailed.  FINDINGS:  Lungs and pleura: Patent central airways. No pleural effusion or pneumothorax. Minimal emphysema. Probable mild biapical pleural and parenchymal scarring.  Mediastinum/Soft Tissues: No coronary calcifications. No adenopathy. Metallic density is noted in the left neck base, possibly a vascular stent.  Upper Abdomen: Limited evaluation with low dose technique. There appears to be a small curvilinear 5 mm calcific density in the left retroperitoneum, which may be in the region of the left renal artery, not well evaluated.  Bones: No acute or aggressive bony abnormality. There appears to be pectus deformity of the lower anterior midline chest wall.  Nodule assessment: 1.  There is a pleural-based 5 to 6 mm nodular density in the lateral left lung apex, series 2 image 52, possibly focal scarring.     IMPRESSION: A pleural-based 5 to 6 mm nodular density in the lateral left lung apex, series 2 image 52, possibly focal scarring. Attention on follow-up imaging.  Metallic density is noted in the left neck base, possibly a vascular stent. Correlate clinically.  There appears to be a small curvilinear 5 mm calcific density in the left retroperitoneum within the upper abdomen, which may be in the region of the left renal artery, not well evaluated. Consider follow-up CTA of the abdomen.  RECOMMENDATIONS: 6 month follow-up with  chest CT (exam code IMG [id number]).  ASSESSMENT: Lung-RADSTM CATEGORY: 3-Probably benign  Smoking cessation resources: 1-800-QUIT-NOW and smokefree.gov  Electronically Signed by: Ky Barban, MD on 12/26/2022 5:33 PM

## 2023-05-22 ENCOUNTER — Telehealth: Payer: Self-pay

## 2023-05-22 NOTE — Telephone Encounter (Signed)
 Copied from CRM [id number]. Topic: Clinical - Request for Lab/Test Order >> May 22, 2023  8:30 AM Nila Nephew wrote: Reason for CRM: Patient had a CT of the Abdomen with [facility] in October. Results were abnormal and they were supposed to schedule patient for a CT of the chest to re-check a 6mm mass discovered on the last imaging. A mass in lung was discovered, along with a mass in the abdomen.   Patient is requesting that a CT of the Chest be sent ASAP. She states her insurance does not require prior authorization for any kind of imaging. Patient is requesting that the current CT of Abdomen be authorized and scheduled.   Patient would like to have both CTs scheduled on the same day if possible.   Provided number for imaging.

## 2023-05-22 NOTE — Telephone Encounter (Signed)
 CT has been sent to imaging, additional CT would need to be requested.

## 2023-05-23 ENCOUNTER — Ambulatory Visit

## 2023-05-23 ENCOUNTER — Other Ambulatory Visit: Payer: Self-pay | Admitting: Internal Medicine

## 2023-05-23 DIAGNOSIS — R911 Solitary pulmonary nodule: Secondary | ICD-10-CM | POA: Diagnosis not present

## 2023-05-23 DIAGNOSIS — R935 Abnormal findings on diagnostic imaging of other abdominal regions, including retroperitoneum: Secondary | ICD-10-CM

## 2023-05-23 MED ORDER — IOHEXOL 350 MG/ML SOLN
100.0000 mL | Freq: Once | INTRAVENOUS | Status: AC | PRN
  Administered 2023-05-23: 100 mL via INTRAVENOUS

## 2023-05-27 ENCOUNTER — Other Ambulatory Visit: Payer: Self-pay | Admitting: *Deleted

## 2023-05-27 DIAGNOSIS — M0579 Rheumatoid arthritis with rheumatoid factor of multiple sites without organ or systems involvement: Secondary | ICD-10-CM

## 2023-05-27 MED ORDER — ORENCIA CLICKJECT 125 MG/ML ~~LOC~~ SOAJ: SUBCUTANEOUS | 0 refills | Status: DC

## 2023-05-27 NOTE — Telephone Encounter (Signed)
 Last Fill: 05/02/2023  4ml  Labs: 04/17/2023 TPMT enzyme function was normal at 17.  Otherwise her results are as we already discussed a partial improvement of the raised liver enzymes after 2 weeks off methotrexate. We could go ahead and try starting the azathioprine 50 mg once daily. I do recommend waiting until afterwards if she has any additional surgery scheduled soon for the elbow.  TB Gold: 04/17/2023   NEGATIVE   Next Visit: 07/03/2023  Last Visit: 04/04/2023  [id number] arthritis involving both hands with positive rheumatoid factor   Current Dose per office note 04/04/2023: Orencia 125 mg subcu weekly   Okay to refill Orencia?

## 2023-06-09 DIAGNOSIS — Z860100 Personal history of colon polyps, unspecified: Secondary | ICD-10-CM

## 2023-06-09 DIAGNOSIS — R9389 Abnormal findings on diagnostic imaging of other specified body structures: Secondary | ICD-10-CM

## 2023-06-09 DIAGNOSIS — K219 Gastro-esophageal reflux disease without esophagitis: Secondary | ICD-10-CM

## 2023-06-09 NOTE — Telephone Encounter (Signed)
 Please let patient know that I reviewed the CT scan.  It appears the CT angiography was done to follow-up an abnormal chest CT finding of atherosclerosis?  It is read as possible "debris" which would be food material in the stomach.  I am not sure if this test was done fasting or not. The radiologist could not exclude that the material in the stomach was blood.  I think this is unlikely.  She does have a history of erosive and shallow ulcerative gastritis from 2022  It would be reasonable to repeat the upper endoscopy just to reevaluate the gastric lining and this can be scheduled if she is agreeable in the [facility]  I do not see any other concerning GI findings on the cross-sectional imaging

## 2023-06-10 MED ORDER — NA SULFATE-K SULFATE-MG SULF 17.5-3.13-1.6 GM/177ML PO SOLN: ORAL | 0 refills | Status: DC

## 2023-06-11 ENCOUNTER — Encounter: Payer: Self-pay | Admitting: Family Medicine

## 2023-06-11 NOTE — Progress Notes (Signed)
 Hi Dawn Griffin, we have the results of the CT of your abdomen back. The aorta overall looks good just a little bit of plaque.  Celiac arteries also look good.  SMA looks good.  IMA looks good.  The renal artery show some plaque particularly in the left renal artery.  It is not causing any significant narrowing which is good and no evidence of fibromuscular dysplasia.  That is more of a genetic condition that can decrease blood flow to the kidney but you do not have it which is great. Did see a little benign cyst on the liver pancreas and spleen are fine.  Adrenal glands are also normal.  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  Make a note on the stomach.  I really do not think this is related to any type of GI bleed that has not been an issue for you it is probably just layering of food debris in the stomach.

## 2023-06-25 NOTE — Progress Notes (Unsigned)
 Office Visit Note  Patient: Dawn Griffin             Date of Birth: Dec 06, 1970           MRN: [id number]             PCP: Cydney Draft, MD Referring: Cydney Draft, * Visit Date: 07/03/2023   Subjective:  Follow-up (Imuran  is not working, total body pain)   History of Present Illness: Dawn Griffin Reasons is a 53 y.o. 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  So far she has not noticed any side effect taking the Imuran  but also not much benefit continues having joint pain and stiffness in multiple areas.  Despite the ultrasound-guided injection, nerve decompression surgery, and revision after wound dehiscence with local infection she still has ongoing pain and numbness in the left 4th and 5th fingers.  She discontinued gabapentin  due to no significant benefit despite taking it for several months.  She was still on the Cymbalta  at 30 mg daily which was being tapered down from 60 as treatment for her lumbar spondylosis not sure whether this is helping things or not now.  Besides the body aches activity is also limited by shortness of breath with exertion.  She had recent follow-up with Chauncy Coral in pulmonology clinic did not find the evidence of active lung inflammation or restrictive defect on PFTs.  Previous HPI 04/04/2023 Dawn Griffin is a 53 y.o. 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 weekly and folic acid  1 mg daily.  The patient is now scheduled for a second surgery involving a transposition of the ulnar nerve and a possible tendon lengthening due to lack of resolution with the cubital tunnel release surgery.   In the past three weeks, the patient has experienced increased pain in the elbow, describing it as sore to touch and painful when lifting objects. The pain is localized  under the elbow, on the bone, and does not appear to be nerve-related. The patient also reports stiffness and soreness in the right hand, particularly in the morning, and numbness in the right hand with any significant use.   The patient has a history of carpal tunnel syndrome, which has been managed with a brace. However, the patient reports that the carpal tunnel symptoms have worsened, with numbness upon waking. The patient also reports skin discoloration and scabbing on one finger, which has been persistent.   In addition to the hand and elbow issues, the patient reports swelling in the knees upon waking, which subsides throughout the day. The patient also reports a return of sciatic pain, particularly in the mornings. The patient has a history of back problems, which were previously managed with cyclobenzaprine .   The patient has been on methotrexate  for an extended period, but reports no noticeable improvement from the medication. Recent liver enzyme tests have shown slightly elevated levels, which may be related to the methotrexate . The patient has also been on Orencia , which initially seemed to help but has not provided significant relief recently.      Previous HPI 01/02/2023 Dawn Griffin is a 53 y.o. 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 weekly and folic acid  1 mg daily.  Unfortunately recent complications with  her left elbow she went for decompression surgery for cubital tunnel syndrome.  This is complicated by then falling onto her elbow causing wound dehiscence with associated purulent drainage.  Initially was treated with Steri-Strips occurring at ED but follow-up in orthopedics office with additional washout planned later today.  Due to the surgeries and associated infection complication she has been off medication has had some increase in symptoms particularly knee pain and swelling.    Previous HPI 10/02/2022 Dawn Griffin is a 53 y.o. female here for follow up for RA/CREST overlap on Orencia  125 mg subcu weekly hydroxychloroquine  300 mg daily and methotrexate  10 mg p.o. weekly and folic acid  1 mg daily.  Since her last visit she became sick with viral upper respiratory symptoms about a month ago these lingered for about 3 weeks with congestion drainage and cough no fevers.  Treatment cleared up and she went to the beach at [facility] with increased physical activity there did have some flareup of bilateral knee swelling worse on the right side started taking prednisone  taper for this initially 20 mg daily down to 10.  Not having any ongoing pulmonary symptoms.   Previous HPI 07/03/2022 Dawn Griffin is a 53 y.o. female here for follow up for RA/CREST overlap on Orencia  125 mg subcu weekly hydroxychloroquine  300 mg daily and methotrexate  10 mg p.o. weekly and folic acid  1 mg daily.  Overall she has been doing pretty well a few symptoms have acted up feels she is having some swelling in the right knee particularly first thing in the mornings that is more than previous.  Is also noticing some difficulty fully flexing the right index finger and occasionally gets a rubbing or popping sensation.  Has noticed new pitting lesions in the fingertips especially over the wintertime.   Previous HPI 12/31/21 Dawn Griffin is a 53 y.o. female here for follow up for RA/CREST overlap on Orencia  125 mg subcu weekly hydroxychloroquine  300 mg daily and methotrexate  10 mg p.o. weekly and folic acid  1 mg daily.  Overall has been doing fairly well since her last visit without major symptom exacerbations.  She was switched on medicines for the pancreatic insufficiency due to loss of efficacy on her previous treatment and is doing well again.  She had some basal cell cancers excised uneventfully.  Continues to have joint pain affecting the right shoulder low back and left hip has not seen peripheral  joint swelling come back.  Raynaud's symptoms have been reasonably controlled she is taking the ADHD medications only as needed.   Previous HPI 06/27/2021 Cledith Kamiya is a 53 y.o. female here for follow up for RA/CREST overlap on orencia  125 mg [place] weekly, HCQ 300 mg daily, and MTX 10 mg PO weekly.  Her RA has been doing well followed up with her doctors in [place] felt this is stable.  She saw Dr. Jorizzo with no new concerning findings on skin exam at that time recommended to continue medications including the methotrexate . She has increase in low back pain since around Christmas time she thinks this was some type of overuse or injury when carrying kids around.  Since this started she had severe pain with left leg symptoms with numbness and pain going down the leg and in the foot.  She had updated imaging of her low back in January and epidural shot performed was very helpful for the radicular symptoms.  She has scheduled upcoming appointments with orthopedic surgery and neurosurgery about more definitive management options for this  problem.   Previous HPI 02/26/21 Margean Korell is a 53 y.o. female here for follow up for RA/CREST overlap on orencia  125 mg [place] weekly, HCQ 300 mg daily, MTX 10 mg PO weekly, and folic acid  1 mg daily. We decreased methotrexate  dose after last visit due to elevation in liver function tests.  Overall she feels her arthritis and scleroderma symptoms are pretty well controlled.  She is experiencing increased verity of Raynaud's symptoms with some new digital pitting during the colder weather but is very proactive with coats gloves and hot hands heating packs.  Continuing to have good GI symptom improvement with the Creon .  Overall she feels her mood and body aches are just overall worse and feels very down which she states has come and gone periodically in previous months.     Previous HPI 05/25/20 Candiss Galeana is a 53 y.o. female with history of carotid artery  aneurysm and centrilobular emphysema here for evaluation of rheumatoid arthritis currently on methotrexate  and orencia  infusion. 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 She was first evaluated with rheumatology around 2014 due to hand pain, stiffness, skin changes, raynaud's, telangiectasias but not started any long term treatments until in 2018 with worsening symptoms was diagnosed with RA. She takes methotrexate  and hydroxychloroquine  for over a year without significant improvement in symptoms, mostly only feeling improvement when on low dose prednisone . Enbrel was added to her treatment but she does not feel this made a particularly good benefit in symptoms. She start Orencia  infusion IV since about 2 months ago and also has not seen much improvement yet, although tolerating the treatment okay. She does not have much joint swelling most of the time, although in the past when she stopped all medicines for a time large bilateral knee swelling occurred and also hand swelling involving the whole hand and fingers. Doing okay today, currently on 10 mg prednisone  she restarted taking it at 20 mg dose and decreased it feeling a bit worse but currently much better than off the medicine.   She had extensive cardiac workup in December with abnormal EKG and subsequent TTE without any obvious problems and cardiac CT checked with no coronary disease problems seen. She was also found to have carotid artery aneurysm during evaluation for vagal nerve stimulator placement as a participant with RESET-RA trial.   She has skin thickening and Raynaud's numerous telangiectasias on hands bilaterally.  She is not currently on any medicine for this.  She has a few areas of small pitting or scarring.  She previously did not try calcium channel blocker due to concern of side effects with a fairly low baseline blood pressure.  She has chronic diarrhea symptoms has had  colonoscopy that was unremarkable.  She is also had swallowing study but does not describe prior upper GI endoscopy.   She has pulmonary emphysema but with good function on PFTs and is following up regularly with pulmonology.  This has been attributed to prior smoking history which she has to reduce but not stopped entirely.  She does have pretty frequent shortness of breath with exertion but no history of hypoxia or abnormal stress testing or PFTs.  She does not notice significant peripheral edema.   Labs reviewed 04/2016 ANA 1:1280 centromere RF 16.3 CCP 117   Review of Systems  Constitutional:  Positive for fatigue.  HENT:  Negative for mouth sores and mouth dryness.   Eyes:  Negative for dryness.  Respiratory:  Positive for shortness of breath.   Cardiovascular:  Positive for chest pain. Negative for palpitations.  Gastrointestinal:  Negative for blood in stool, constipation and diarrhea.  Endocrine: Negative for increased urination.  Genitourinary:  Negative for involuntary urination.  Musculoskeletal:  Positive for joint pain, joint pain, joint swelling, myalgias, muscle weakness, morning stiffness and myalgias. Negative for gait problem and muscle tenderness.  Skin:  Positive for color change and sensitivity to sunlight. Negative for rash and hair loss.  Allergic/Immunologic: Negative for susceptible to infections.  Neurological:  Positive for dizziness. Negative for headaches.  Hematological:  Negative for swollen glands.  Psychiatric/Behavioral:  Positive for sleep disturbance. Negative for depressed mood. The patient is not nervous/anxious.     PMFS History:  Patient Active Problem List   Diagnosis Date Noted  . Pain in right shoulder 12/31/2021  . Low back pain 06/27/2021  . Lumbar spondylosis 03/23/2021  . 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 Rheumatoid arthritis involving both hands with positive rheumatoid factor (HCC) 12/12/2020  . Pancreatic insufficiency  10/10/2020  . CREST syndrome (HCC) 07/06/2020  . GERD (gastroesophageal reflux disease) 07/06/2020  . High risk medication use 05/25/2020  . Vitamin D  deficiency 05/25/2020  . S/P coil embolization of cerebral aneurysm 02/28/2020  . Centrilobular emphysema (HCC) 08/04/2019  . Chronic fatigue 05/10/2019  . Brain fog 05/10/2019  . Myofascial pain 06/30/2018  . Rheumatoid arthritis (HCC) 05/20/2017  . Bilateral carpal tunnel syndrome, left cubital tunnel syndrome 08/17/2013  . 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 SOB (shortness of breath) 02/26/2013  . ANEMIA, HX OF 01/10/2010  . URINARY URGENCY 12/21/2009  . Enlarged lymph nodes 08/15/2008    Past Medical History:  Diagnosis Date  . Aneurysm (HCC) 02/2020  . Aortic atherosclerosis (HCC)   . CREST (calcinosis, Raynaud's phenomenon, esophageal dysfunction, sclerodactyly, telangiectasia) (HCC) 2022  . Emphysema of lung (HCC)   . GERD (gastroesophageal reflux disease)   . IBS (irritable bowel syndrome)   . Irritable bowel syndrome with diarrhea   . Pancreatic divisum   . Raynaud disease   . Rheumatoid arthritis (HCC)   . Sessile colonic polyp     Family History  Problem Relation Age of Onset  . Bipolar disorder Mother   . Lung cancer Mother   . Lung cancer Father   . Gout Father   . Healthy Sister   . Lupus Sister   . Arthritis/Rheumatoid Maternal Grandmother   . Stomach cancer Paternal Grandmother   . Healthy Brother   . Colon cancer Neg Hx   . Esophageal cancer Neg Hx   . Colon polyps Neg Hx   . Rectal cancer Neg Hx   . Breast cancer Neg Hx    Past Surgical History:  Procedure Laterality Date  . ANEURYSM COILING  02/2020  . ELBOW SURGERY Left   . FINGER ARTHROSCOPY WITH CARPOMETACARPEL (CMC) ARTHROPLASTY Left 03/2019  . LUMBAR SPINE SURGERY  2001   L4-5-S1  . TONSILLECTOMY  1990  . ULNAR NERVE TRANSPOSITION Left 04/10/2023  . ULNAR TUNNEL RELEASE Left 12/12/2022  . VAGUS NERVE STIMULATOR  INSERTION  09/2019   Social History   Social History Narrative   No exercise. + daily caffeine.    Immunization History  Administered Date(s) Administered  . Influenza Whole 01/10/2010  . Tdap 01/11/2013, 03/27/2023     Objective: Vital Signs: BP 106/63 (BP Location: Right Arm, Patient Position: Sitting, Cuff Size: Normal)   Pulse 85   Resp 14   Ht 5'  4" (1.626 m)   Wt 119 lb (54 kg)   BMI 20.43 kg/m    Physical Exam Eyes:     Conjunctiva/sclera: Conjunctivae normal.  Cardiovascular:     Rate and Rhythm: Normal rate and regular rhythm.     Comments: Modified Allen's test with delayed flushing via ulnar artery Pulmonary:     Effort: Pulmonary effort is normal.     Breath sounds: Normal breath sounds.  Lymphadenopathy:     Cervical: No cervical adenopathy.  Skin:    General: Skin is warm and dry.     Comments: Skin thickening over fingers of both hands distal to MCPs Numerous fingertip and palmar telangiectasias  Neurological:     Mental Status: She is alert.  Psychiatric:        Mood and Affect: Mood normal.     Musculoskeletal Exam:  Shoulders full ROM no tenderness or swelling Left elbow tenderness at medial epicondyle without palpable swelling or radiation Wrists full ROM no tenderness or swelling Fingers full ROM, skin tightness restrictive, MCP and PIP tenderness without palpable synovitis Knees full ROM no tenderness or swelling Ankles full ROM no tenderness or swelling  Investigation: No additional findings.  Imaging: No results found.  Recent Labs: Lab Results  Component Value Date   WBC 10.1 05/02/2023   HGB 13.5 05/02/2023   PLT 278 05/02/2023   NA 138 05/02/2023   K 4.5 05/02/2023   CL 100 05/02/2023   CO2 23 05/02/2023   GLUCOSE 99 05/02/2023   BUN 11 05/02/2023   CREATININE 0.69 05/02/2023   BILITOT 0.3 05/02/2023   ALKPHOS 63 05/02/2023   AST 38 05/02/2023   ALT 45 (H) 05/02/2023   PROT 6.3 05/02/2023   ALBUMIN 4.2 05/02/2023    CALCIUM 9.5 05/02/2023   GFRAA 102 09/06/2020   QFTBGOLDPLUS NEGATIVE 04/17/2023    Speciality Comments: PLQ Eye Exam 08/20/2022 normal [place] f/u 1 year  Procedures:  No procedures performed Allergies: Patient has no known allergies.   Assessment / Plan:     Visit Diagnoses: Rheumatoid arthritis involving both hands with positive rheumatoid factor (HCC) - Hold Methotrexate  and recheck liver enzymes in two weeks. Consider starting Azathioprine  after Methotrexate  washout period if liver enzymes improve. - Plan: azaTHIOprine  (IMURAN ) 50 MG tablet, hydroxychloroquine  (PLAQUENIL ) 200 MG tablet, Sedimentation rate, C-reactive protein  High risk medication use - Orencia  125 mg subcu weekly, hydroxychloroquine  200 mg daily, and Azathioprine  50 mg daily. PLQ Eye Exam 08/20/2022 normal - Plan: CBC with Differential/Platelet, Comprehensive metabolic panel with GFR  Pancreatic insufficiency - Viberzi  supplementation  Limited scleroderma (HCC)  Low back pain, unspecified back pain laterality, unspecified chronicity, unspecified whether sciatica present  Myofascial pain - Trial of Cyclobenzaprine  (Flexeril ) for overnight relief.  Lumbar spondylosis - Plan: DULoxetine  (CYMBALTA ) 60 MG capsule  Rheumatoid arthritis involving both hands with positive rheumatoid factor (HCC) - Plan: azaTHIOprine  (IMURAN ) 50 MG tablet, hydroxychloroquine  (PLAQUENIL ) 200 MG tablet, Sedimentation rate, C-reactive protein  ***  Orders: Orders Placed This Encounter  Procedures  . Sedimentation rate  . CBC with Differential/Platelet  . Comprehensive metabolic panel with GFR  . C-reactive protein   Meds ordered this encounter  Medications  . DULoxetine  (CYMBALTA ) 60 MG capsule    Sig: Take 1 capsule (60 mg total) by mouth daily.    Dispense:  90 capsule    Refill:  0  . azaTHIOprine  (IMURAN ) 50 MG tablet    Sig: Take 2 tablets (100 mg total) by mouth daily.  Dispense:  180 tablet    Refill:  0  .  hydroxychloroquine  (PLAQUENIL ) 200 MG tablet    Sig: Take 1 tablet (200 mg total) by mouth daily.    Dispense:  90 tablet    Refill:  1     Follow-Up Instructions: No follow-ups on file.   Matt Song, MD  Note - This record has been created using [employer].  Chart creation errors have been sought, but may not always  have been located. Such creation errors do not reflect on  the standard of medical care.

## 2023-06-26 ENCOUNTER — Encounter: Payer: Self-pay | Admitting: Family Medicine

## 2023-06-28 ENCOUNTER — Other Ambulatory Visit: Payer: Self-pay | Admitting: Internal Medicine

## 2023-06-30 NOTE — Telephone Encounter (Signed)
 Last Fill: 05/02/2023  Labs: 05/02/2023  Lymphocytes Absolute 4.5 EOS 0.5 ALT 45  Next Visit: 07/03/2023  Last Visit: 04/04/2023  DX:  Rheumatoid arthritis involving both hands with positive rheumatoid factor (HCC)   Current Dose per office note 04/04/2023: not mentioned, per office note: Consider starting Azathioprine  after Methotrexate  washout period if liver enzymes improve.   Okay to refill Imuran ?

## 2023-07-03 ENCOUNTER — Encounter: Payer: Self-pay | Admitting: Internal Medicine

## 2023-07-03 ENCOUNTER — Ambulatory Visit: Payer: Commercial Managed Care - PPO | Attending: Internal Medicine | Admitting: Internal Medicine

## 2023-07-03 VITALS — BP 106/63 | HR 85 | Resp 14 | Ht 64.0 in | Wt 119.0 lb

## 2023-07-03 DIAGNOSIS — M349 Systemic sclerosis, unspecified: Secondary | ICD-10-CM

## 2023-07-03 DIAGNOSIS — Z79899 Other long term (current) drug therapy: Secondary | ICD-10-CM

## 2023-07-03 DIAGNOSIS — M545 Low back pain, unspecified: Secondary | ICD-10-CM

## 2023-07-03 DIAGNOSIS — K8689 Other specified diseases of pancreas: Secondary | ICD-10-CM | POA: Diagnosis not present

## 2023-07-03 DIAGNOSIS — M05741 Rheumatoid arthritis with rheumatoid factor of right hand without organ or systems involvement: Secondary | ICD-10-CM | POA: Diagnosis not present

## 2023-07-03 DIAGNOSIS — M47816 Spondylosis without myelopathy or radiculopathy, lumbar region: Secondary | ICD-10-CM

## 2023-07-03 DIAGNOSIS — M7918 Myalgia, other site: Secondary | ICD-10-CM

## 2023-07-03 DIAGNOSIS — M05742 Rheumatoid arthritis with rheumatoid factor of left hand without organ or systems involvement: Secondary | ICD-10-CM

## 2023-07-03 MED ORDER — AZATHIOPRINE 50 MG PO TABS: 100.0000 mg | ORAL_TABLET | Freq: Every day | ORAL | 0 refills | Status: DC

## 2023-07-03 MED ORDER — HYDROXYCHLOROQUINE SULFATE 200 MG PO TABS: 200.0000 mg | ORAL_TABLET | Freq: Every day | ORAL | 1 refills | Status: DC

## 2023-07-03 MED ORDER — DULOXETINE HCL 60 MG PO CPEP: 60.0000 mg | ORAL_CAPSULE | Freq: Every day | ORAL | 0 refills | Status: DC

## 2023-07-04 LAB — COMPREHENSIVE METABOLIC PANEL WITH GFR
AG Ratio: 2.4 (calc) (ref 1.0–2.5)
ALT: 18 U/L (ref 6–29)
AST: 23 U/L (ref 10–35)
Albumin: 4.7 g/dL (ref 3.6–5.1)
Alkaline phosphatase (APISO): 49 U/L (ref 37–153)
BUN: 13 mg/dL (ref 7–25)
CO2: 26 mmol/L (ref 20–32)
Calcium: 9.7 mg/dL (ref 8.6–10.4)
Chloride: 103 mmol/L (ref 98–110)
Creat: 0.7 mg/dL (ref 0.50–1.03)
Globulin: 2 g/dL (ref 1.9–3.7)
Glucose, Bld: 89 mg/dL (ref 65–99)
Potassium: 4.6 mmol/L (ref 3.5–5.3)
Sodium: 138 mmol/L (ref 135–146)
Total Bilirubin: 0.4 mg/dL (ref 0.2–1.2)
Total Protein: 6.7 g/dL (ref 6.1–8.1)
eGFR: 105 mL/min/{1.73_m2} (ref >=60)

## 2023-07-04 LAB — CBC WITH DIFFERENTIAL/PLATELET
Absolute Lymphocytes: 3674 {cells}/uL (ref 850–3900)
Absolute Monocytes: 491 {cells}/uL (ref 200–950)
Basophils Absolute: 62 {cells}/uL (ref 0–200)
Basophils Relative: 0.8 %
Eosinophils Absolute: 289 {cells}/uL (ref 15–500)
Eosinophils Relative: 3.7 %
HCT: 40.9 % (ref 35.0–45.0)
Hemoglobin: 13.4 g/dL (ref 11.7–15.5)
MCH: 30.4 pg (ref 27.0–33.0)
MCHC: 32.8 g/dL (ref 32.0–36.0)
MCV: 92.7 fL (ref 80.0–100.0)
MPV: 10.3 fL (ref 7.5–12.5)
Monocytes Relative: 6.3 %
Neutro Abs: 3284 {cells}/uL (ref 1500–7800)
Neutrophils Relative %: 42.1 %
Platelets: 241 10*3/uL (ref 140–400)
RBC: 4.41 10*6/uL (ref 3.80–5.10)
RDW: 11.7 % (ref 11.0–15.0)
Total Lymphocyte: 47.1 %
WBC: 7.8 10*3/uL (ref 3.8–10.8)

## 2023-07-04 LAB — SEDIMENTATION RATE: Sed Rate: 2 mm/h (ref 0–20)

## 2023-07-04 LAB — C-REACTIVE PROTEIN: CRP: <3 mg/L (ref <8.0)

## 2023-08-05 ENCOUNTER — Encounter: Payer: Self-pay | Admitting: Family Medicine

## 2023-08-05 DIAGNOSIS — Z0000 Encounter for general adult medical examination without abnormal findings: Secondary | ICD-10-CM

## 2023-08-05 DIAGNOSIS — R4189 Other symptoms and signs involving cognitive functions and awareness: Secondary | ICD-10-CM

## 2023-08-05 MED ORDER — AMPHETAMINE-DEXTROAMPHETAMINE 15 MG PO TABS: 15.0000 mg | ORAL_TABLET | Freq: Two times a day (BID) | ORAL | 0 refills | Status: DC

## 2023-08-05 NOTE — Telephone Encounter (Signed)
 Requesting rx rf of Adderall 15mg   Last written 03/27/2023 Last OV 03/27/2023 Upcoming appt = none

## 2023-08-07 IMAGING — CT CT ABDOMEN WO/W CM
3 of 11 series · 12 of 46 positions shown, 17 images · IV contrast (OMNIPAQUE 300)
Comparison: Chest CT August 03, 2019.

CLINICAL DATA: Concern for pancreatic insufficiency.

EXAM:
CT ABDOMEN WITHOUT AND WITH CONTRAST
TECHNIQUE: Multidetector CT imaging of the abdomen was performed following the
standard protocol before and following the bolus administration of
intravenous contrast.
CONTRAST:  80mL OMNIPAQUE IOHEXOL 300 MG/ML  SOLN

[Series 4: arterial phase 3.0 br38 · axial · arterial · 0.61mm/px · z∈[-274,-133]mm · 6 of 67 slices shown]
[im 10/67  soft-tissue]
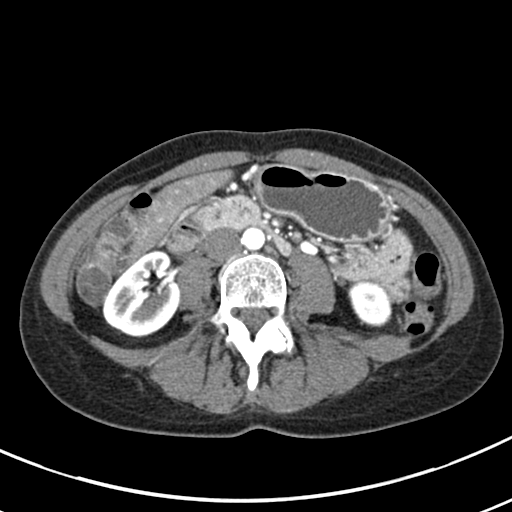
[im 19/67  soft-tissue]
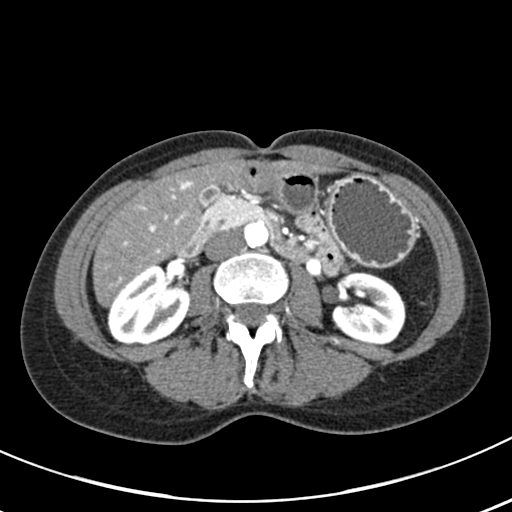
[im 29/67  soft-tissue]
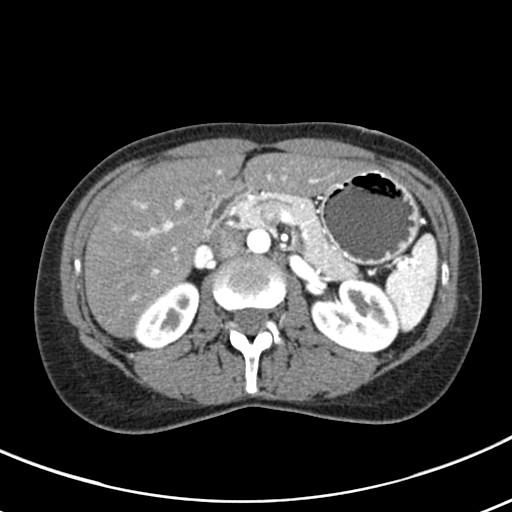
[im 38/67  soft-tissue]
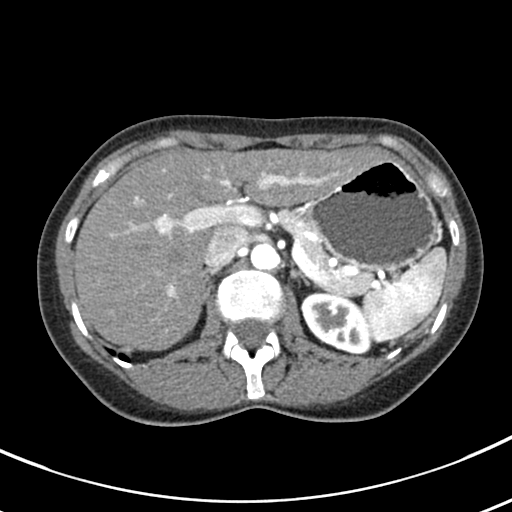
[im 48/67  soft-tissue]
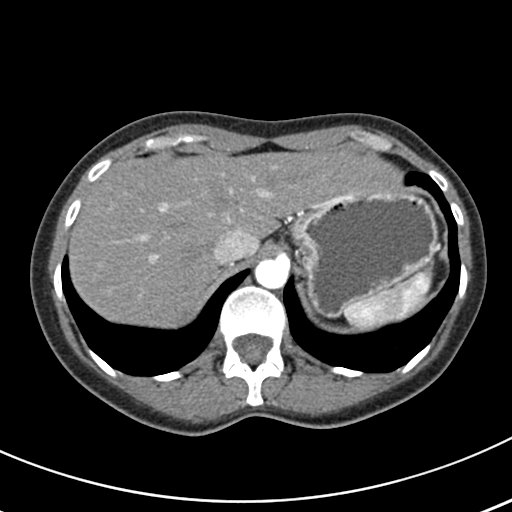
[im 57/67  soft-tissue]
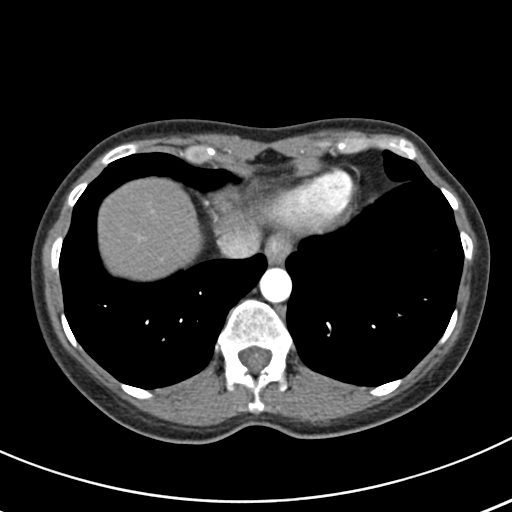

[Series 7: venous phase 5.0 br38 · axial · portal-venous · 0.62mm/px · z∈[-253,-153]mm · 3 of 41 slices shown, 7 images]
[im 11/41  soft-tissue]
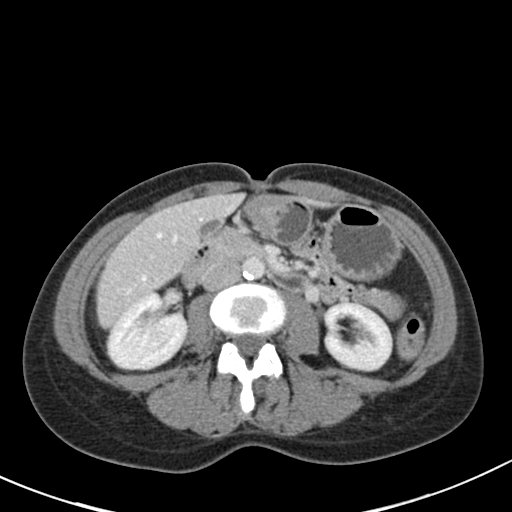
[im 11/41  lung]
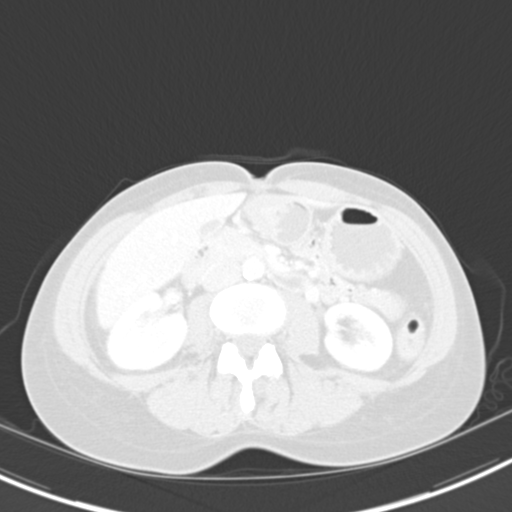
[im 11/41  bone]
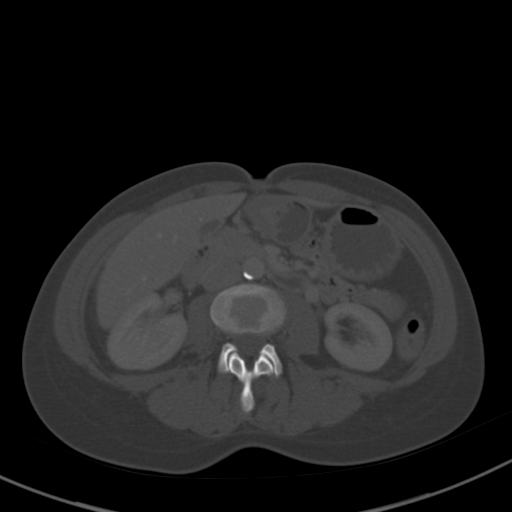
[im 21/41  soft-tissue]
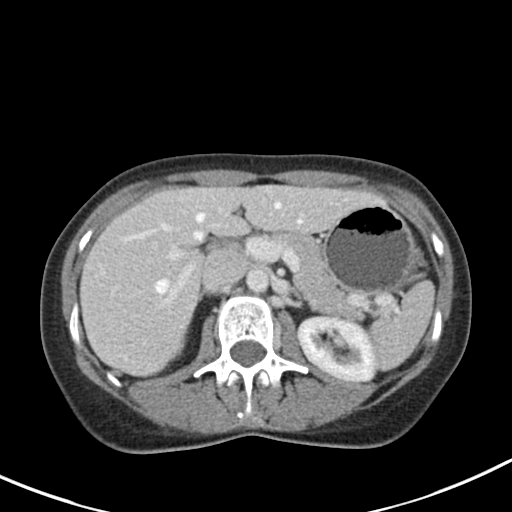
[im 21/41  lung]
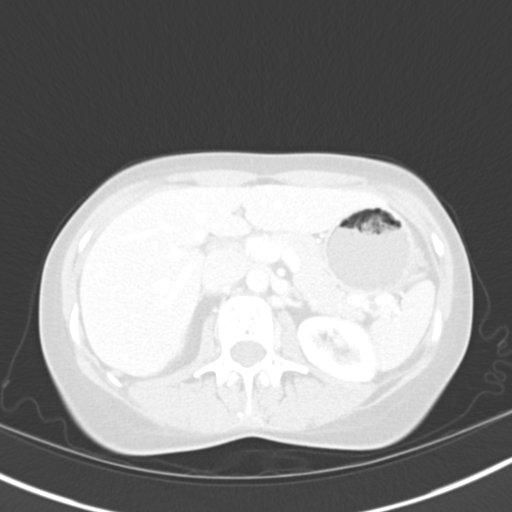
[im 31/41  soft-tissue]
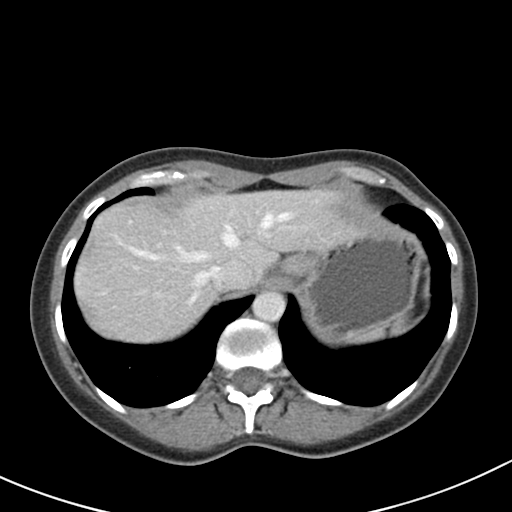
[im 31/41  lung]
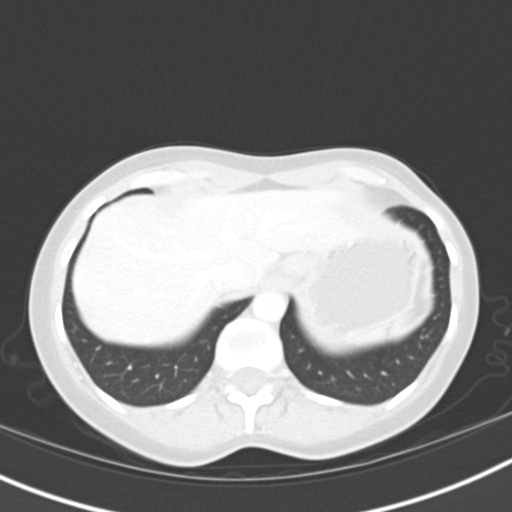

[Series 11: coronal arterial · coronal · arterial · 0.39mm/px · 3 of 63 slices shown, 4 images]
[im 16/63  soft-tissue]
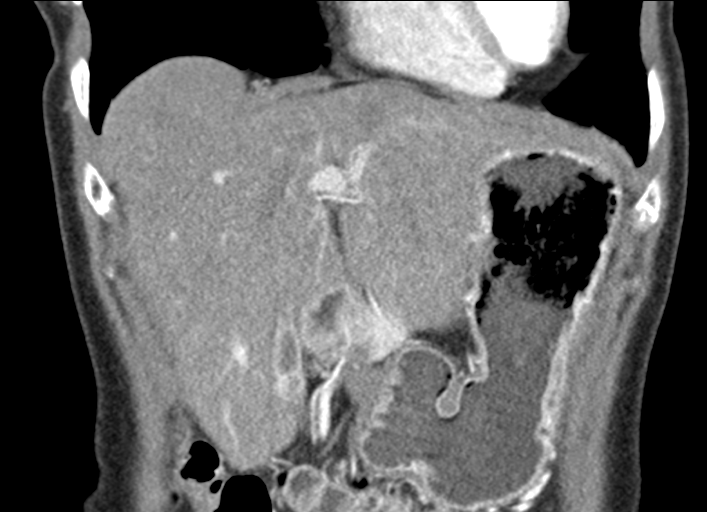
[im 32/63  soft-tissue]
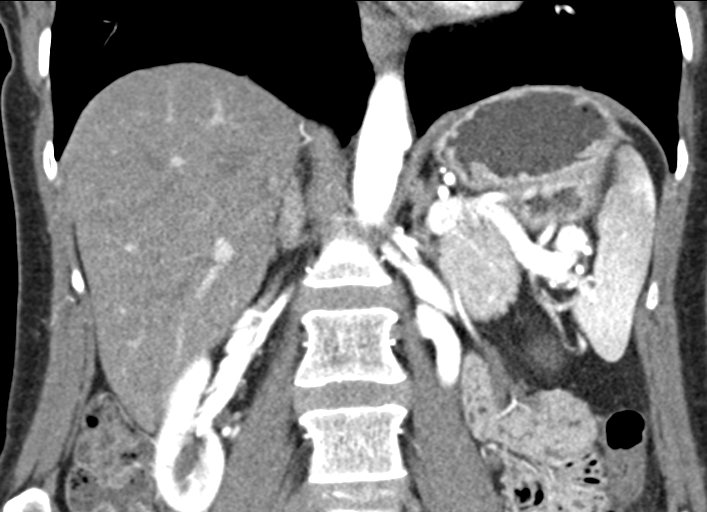
[im 32/63  bone]
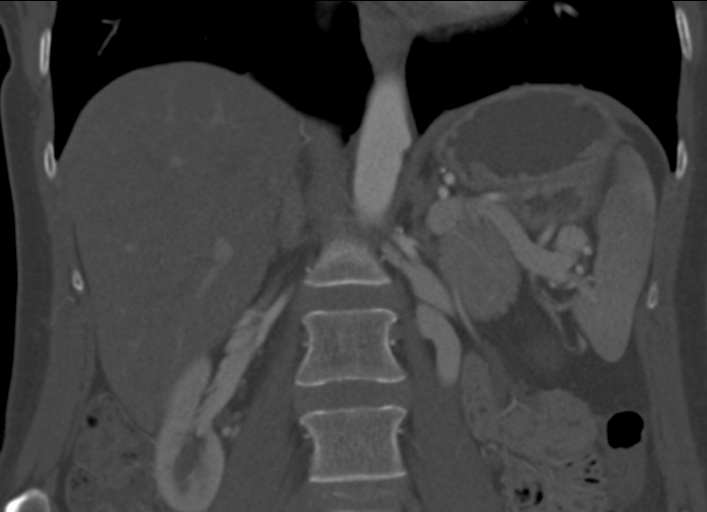
[im 47/63  soft-tissue]
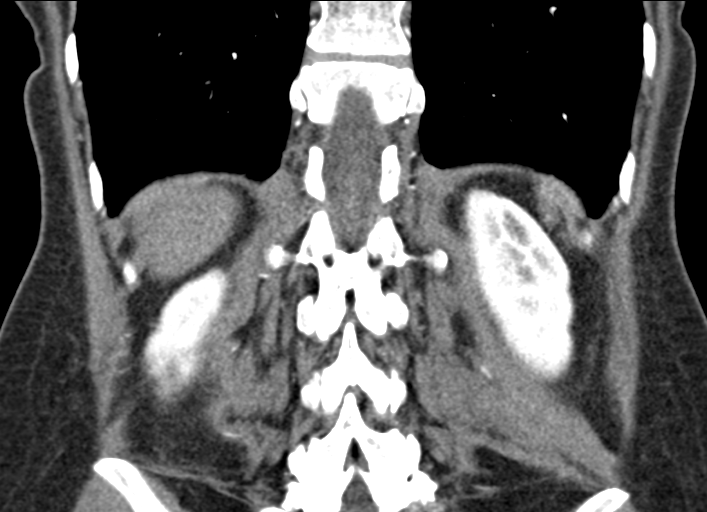

[12 of 46 positions shown; findings below may reference images not displayed]

FINDINGS: Lower chest: No acute abnormality.  Pectus excavatum deformity.

Hepatobiliary: No suspicious hepatic lesions. Gallbladder is
unremarkable. No biliary ductal dilation.

Pancreas: No pancreatic ductal dilation. Pancreatic divisum. No
cystic or arterially enhancing solid pancreatic lesions visualized.
Pancreatic parenchyma appears normal with symmetric enhancement.

Spleen: Within normal limits.

Adrenals/Urinary Tract: Adrenal glands are unremarkable. Kidneys are
normal, without renal calculi, focal lesion, or hydronephrosis.

Stomach/Bowel: Stomach is within normal limits. Appendix is not
visualized. No evidence of bowel wall thickening, distention, or
inflammatory changes. Scattered left-sided colonic diverticulosis
without findings of acute diverticulitis.

Vascular/Lymphatic: Aortic atherosclerosis without aneurysmal
dilation. No pathologically enlarged abdominal lymph nodes.

Other: No abdominal ascites.

Musculoskeletal: Mild lumbar spondylosis. No acute osseous
abnormality.
IMPRESSION: 1. Normal symmetric enhancement of the pancreatic parenchyma without
evidence of significant pancreatic atrophy.
2. No cystic or arterially enhancing solid pancreatic lesions
visualized.
3. Pancreatic divisum, anatomic variant.
4. Scattered left-sided colonic diverticulosis without findings of
acute diverticulitis.
5.  Aortic Atherosclerosis ([id number]).

## 2023-08-12 ENCOUNTER — Encounter: Payer: Self-pay | Admitting: Internal Medicine

## 2023-08-21 ENCOUNTER — Ambulatory Visit: Admitting: Internal Medicine

## 2023-08-21 ENCOUNTER — Encounter: Payer: Self-pay | Admitting: Internal Medicine

## 2023-08-21 VITALS — BP 96/61 | HR 68 | Temp 98.0°F | Resp 13 | Ht 64.0 in | Wt 117.4 lb

## 2023-08-21 DIAGNOSIS — D124 Benign neoplasm of descending colon: Secondary | ICD-10-CM

## 2023-08-21 DIAGNOSIS — R933 Abnormal findings on diagnostic imaging of other parts of digestive tract: Secondary | ICD-10-CM | POA: Diagnosis not present

## 2023-08-21 DIAGNOSIS — K635 Polyp of colon: Secondary | ICD-10-CM | POA: Diagnosis not present

## 2023-08-21 DIAGNOSIS — L03019 Cellulitis of unspecified finger: Secondary | ICD-10-CM

## 2023-08-21 DIAGNOSIS — D123 Benign neoplasm of transverse colon: Secondary | ICD-10-CM

## 2023-08-21 DIAGNOSIS — K219 Gastro-esophageal reflux disease without esophagitis: Secondary | ICD-10-CM

## 2023-08-21 DIAGNOSIS — Z860100 Personal history of colon polyps, unspecified: Secondary | ICD-10-CM

## 2023-08-21 DIAGNOSIS — Z1211 Encounter for screening for malignant neoplasm of colon: Secondary | ICD-10-CM

## 2023-08-21 DIAGNOSIS — K5730 Diverticulosis of large intestine without perforation or abscess without bleeding: Secondary | ICD-10-CM

## 2023-08-21 DIAGNOSIS — D125 Benign neoplasm of sigmoid colon: Secondary | ICD-10-CM

## 2023-08-21 MED ORDER — SODIUM CHLORIDE 0.9 % IV SOLN: 500.0000 mL | Freq: Once | INTRAVENOUS | Status: DC

## 2023-08-21 NOTE — Op Note (Signed)
 [facility] Patient Name: Dawn Griffin Procedure Date: 08/21/2023 9:36 AM MRN: [id number] Endoscopist: Nannette Babe , MD, [id number] Age: 53 Referring MD:  Date of Birth: January 09, 1971 Gender: Female Account #: [id number] Procedure:                Colonoscopy Indications:              High risk colon cancer surveillance: Personal                            history of sessile serrated colon polyp (10 mm or                            greater in size), Last colonoscopy: August 2022 (TA                            and SSP, one > 1 cm) Medicines:                Monitored Anesthesia Care Procedure:                Pre-Anesthesia Assessment:                           - Prior to the procedure, a History and Physical                            was performed, and patient medications and                            allergies were reviewed. The patient's tolerance of                            previous anesthesia was also reviewed. The risks                            and benefits of the procedure and the sedation                            options and risks were discussed with the patient.                            All questions were answered, and informed consent                            was obtained. Prior Anticoagulants: The patient has                            taken no anticoagulant or antiplatelet agents. ASA                            Grade Assessment: II - A patient with mild systemic                            disease. After reviewing the risks and benefits,  the patient was deemed in satisfactory condition to                            undergo the procedure.                           After obtaining informed consent, the colonoscope                            was passed under direct vision. Throughout the                            procedure, the patient's blood pressure, pulse, and                            oxygen saturations were monitored  continuously. The                            Olympus Scope SN [id number] was introduced through the                            anus and advanced to the cecum, identified by                            appendiceal orifice and ileocecal valve. The                            colonoscopy was performed without difficulty. The                            patient tolerated the procedure well. The quality                            of the bowel preparation was good. The ileocecal                            valve, appendiceal orifice, and rectum were                            photographed. Scope In: 9:56:53 AM Scope Out: 10:08:53 AM Scope Withdrawal Time: 0 hours 10 minutes 1 second  Total Procedure Duration: 0 hours 12 minutes 0 seconds  Findings:                 The digital rectal exam was normal.                           A 5 mm polyp was found in the transverse colon. The                            polyp was sessile. The polyp was removed with a                            cold snare. Resection and retrieval were complete.  A 6 mm polyp was found in the descending colon. The                            polyp was sessile. The polyp was removed with a                            cold snare. Resection and retrieval were complete.                           A 3 mm polyp was found in the sigmoid colon. The                            polyp was sessile. The polyp was removed with a                            cold snare. Resection and retrieval were complete.                           Multiple medium-mouthed and small-mouthed                            diverticula were found in the sigmoid colon.                           The retroflexed view of the distal rectum and anal                            verge was normal and showed no anal or rectal                            abnormalities. Complications:            No immediate complications. Estimated Blood Loss:     Estimated blood  loss: none. Impression:               - One 5 mm polyp in the transverse colon, removed                            with a cold snare. Resected and retrieved.                           - One 6 mm polyp in the descending colon, removed                            with a cold snare. Resected and retrieved.                           - One 3 mm polyp in the sigmoid colon, removed with                            a cold snare. Resected and retrieved.                           -  Moderate diverticulosis in the sigmoid colon.                           - The distal rectum and anal verge are normal on                            retroflexion view. Recommendation:           - Patient has a contact number available for                            emergencies. The signs and symptoms of potential                            delayed complications were discussed with the                            patient. Return to normal activities tomorrow.                            Written discharge instructions were provided to the                            patient.                           - Resume previous diet.                           - Continue present medications.                           - Await pathology results.                           - Repeat colonoscopy in 5 years for surveillance. Nannette Babe, MD 08/21/2023 10:16:46 AM This report has been signed electronically.

## 2023-08-21 NOTE — Progress Notes (Signed)
 Called to room to assist during endoscopic procedure.  Patient ID and intended procedure confirmed with present staff. Received instructions for my participation in the procedure from the performing physician.

## 2023-08-21 NOTE — Progress Notes (Signed)
 ,   GASTROENTEROLOGY PROCEDURE H&P NOTE   Primary Care Physician: Cydney Draft, MD    Reason for Procedure:  GERD, normal CT stomach and history of colon polyps  Plan:    EGD and colonoscopy  Patient is appropriate for endoscopic procedure(s) in the ambulatory ([facility]) setting.  The nature of the procedure, as well as the risks, benefits, and alternatives were carefully and thoroughly reviewed with the patient. Ample time for discussion and questions allowed. The patient understood, was satisfied, and agreed to proceed.     HPI: Dawn Griffin is a 53 y.o. female who presents for EGD and colonoscopy.  Medical history as below.  Tolerated the prep.  No recent chest pain or shortness of breath.  No abdominal pain today.  Past Medical History:  Diagnosis Date   Aneurysm (HCC) 02/2020   Aortic atherosclerosis (HCC)    CREST (calcinosis, Raynaud's phenomenon, esophageal dysfunction, sclerodactyly, telangiectasia) (HCC) 2022   Emphysema of lung (HCC)    GERD (gastroesophageal reflux disease)    IBS (irritable bowel syndrome)    Irritable bowel syndrome with diarrhea    Pancreatic divisum    Raynaud disease    Rheumatoid arthritis (HCC)    Sessile colonic polyp     Past Surgical History:  Procedure Laterality Date   ANEURYSM COILING  02/2020   ELBOW SURGERY Left    FINGER ARTHROSCOPY WITH CARPOMETACARPEL ([facility]) ARTHROPLASTY Left 03/2019   LUMBAR SPINE SURGERY  2001   L4-5-S1   TONSILLECTOMY  1990   ULNAR NERVE TRANSPOSITION Left 04/10/2023   ULNAR TUNNEL RELEASE Left 12/12/2022   VAGUS NERVE STIMULATOR INSERTION  09/2019    Prior to Admission medications   Medication Sig Start Date End Date Taking? Authorizing Provider  amphetamine -dextroamphetamine  (ADDERALL) 15 MG tablet Take 1 tablet by mouth 2 (two) times daily. 08/05/23  Yes Cydney Draft, MD  azaTHIOprine  (IMURAN ) 50 MG tablet Take 2 tablets (100 mg total) by mouth daily. 07/03/23  Yes Rice, Haig Levan, MD  cyanocobalamin (VITAMIN B12) 1000 MCG/ML injection Inject 1,000 mcg into the muscle once a week. 07/31/23  Yes [provider]  DULoxetine  (CYMBALTA ) 60 MG capsule Take 1 capsule (60 mg total) by mouth daily. 07/03/23  Yes Rice, Haig Levan, MD  Eluxadoline  (VIBERZI ) 75 MG TABS Take 1 tablet (75 mg total) by mouth daily. 05/01/23  Yes Hadley Detloff, Amber Bail, MD  famotidine (PEPCID) 20 MG tablet Take 20 mg by mouth 2 (two) times daily.   Yes [provider]  hydroxychloroquine  (PLAQUENIL ) 200 MG tablet Take 1 tablet (200 mg total) by mouth daily. 07/03/23  Yes Rice, Haig Levan, MD  Multiple Vitamin (MULTI-VITAMIN) tablet Take 1 tablet by mouth daily. 06/12/21  Yes [provider]  Abatacept  (ORENCIA  CLICKJECT) 125 MG/ML SOAJ INJECT 1 PEN SUBCUTANEOUSLY  WEEKLY 05/27/23   Rice, Haig Levan, MD    Current Outpatient Medications  Medication Sig Dispense Refill   amphetamine -dextroamphetamine  (ADDERALL) 15 MG tablet Take 1 tablet by mouth 2 (two) times daily. 180 tablet 0   azaTHIOprine  (IMURAN ) 50 MG tablet Take 2 tablets (100 mg total) by mouth daily. 180 tablet 0   cyanocobalamin (VITAMIN B12) 1000 MCG/ML injection Inject 1,000 mcg into the muscle once a week.     DULoxetine  (CYMBALTA ) 60 MG capsule Take 1 capsule (60 mg total) by mouth daily. 90 capsule 0   Eluxadoline  (VIBERZI ) 75 MG TABS Take 1 tablet (75 mg total) by mouth daily. 90 tablet 3   famotidine (PEPCID)  20 MG tablet Take 20 mg by mouth 2 (two) times daily.     hydroxychloroquine  (PLAQUENIL ) 200 MG tablet Take 1 tablet (200 mg total) by mouth daily. 90 tablet 1   Multiple Vitamin (MULTI-VITAMIN) tablet Take 1 tablet by mouth daily.     Abatacept  (ORENCIA  CLICKJECT) 125 MG/ML SOAJ INJECT 1 PEN SUBCUTANEOUSLY  WEEKLY 12 mL 0   Current Facility-Administered Medications  Medication Dose Route Frequency Provider Last Rate Last Admin   0.9 %  sodium chloride  infusion  500 mL Intravenous Once Keyler Hoge, Amber Bail, MD         Allergies as of 08/21/2023   (No Known Allergies)    Family History  Problem Relation Age of Onset   Bipolar disorder Mother    Lung cancer Mother    Lung cancer Father    Gout Father    Healthy Sister    Lupus Sister    Arthritis/Rheumatoid Maternal Grandmother    Stomach cancer Paternal Grandmother    Healthy Brother    Colon cancer Neg Hx    Esophageal cancer Neg Hx    Colon polyps Neg Hx    Rectal cancer Neg Hx    Breast cancer Neg Hx     Social History   Socioeconomic History   Marital status: Married    Spouse name: Not on file   Number of children: Not on file   Years of education: Not on file   Highest education level: Some college, no degree  Occupational History   Not on file  Tobacco Use   Smoking status: Former    Current packs/day: 0.00    Average packs/day: 0.3 packs/day for 33.0 years (9.9 ttl pk-yrs)    Types: Cigarettes    Start date: 10/31/1987    Quit date: 10/30/2020    Years since quitting: 2.8    Passive exposure: Past   Smokeless tobacco: Never  Vaping Use   Vaping status: Never Used  Substance and Sexual Activity   Alcohol use: Yes    Alcohol/week: 4.0 standard drinks of alcohol    Types: 4 Standard drinks or equivalent per week   Drug use: No   Sexual activity: Yes    Partners: Male  Other Topics Concern   Not on file  Social History Narrative   No exercise. + daily caffeine.    Social Drivers of Corporate investment banker Strain: Low Risk  (08/05/2023)   Received from [facility]   Overall Financial Resource Strain (CARDIA)    Difficulty of Paying Living Expenses: Not hard at all  Food Insecurity: No Food Insecurity (08/05/2023)   Received from [facility]   Hunger Vital Sign    Worried About Running Out of Food in the Last Year: Never true    Ran Out of Food in the Last Year: Never true  Transportation Needs: No Transportation Needs (08/05/2023)   Received from [facility] - Transportation    Lack of  Transportation (Medical): No    Lack of Transportation (Non-Medical): No  Physical Activity: Unknown (08/05/2023)   Received from [facility]   Exercise Vital Sign    Days of Exercise per Week: 0 days    Minutes of Exercise per Session: Not on file  Stress: No Stress Concern Present (08/05/2023)   Received from [facility] - Occupational Stress Questionnaire    Feeling of Stress : Not at all  Social Connections: Moderately Integrated (08/05/2023)  Received from [facility]   Social Network    How would you rate your social network (family, work, friends)?: Adequate participation with social networks  Intimate Partner Violence: Not At Risk (08/05/2023)   Received from [facility]   HITS    Over the last 12 months how often did your partner physically hurt you?: Never    Over the last 12 months how often did your partner insult you or talk down to you?: Never    Over the last 12 months how often did your partner threaten you with physical harm?: Never    Over the last 12 months how often did your partner scream or curse at you?: Never    Physical Exam: Vital signs in last 24 hours: @BP  107/72   Pulse 81   Temp 98 F (36.7 C)   Ht 5' 4 (1.626 m)   Wt 117 lb 6.4 oz (53.3 kg)   SpO2 98%   BMI 20.15 kg/m  GEN: NAD EYE: Sclerae anicteric ENT: MMM CV: Non-tachycardic Pulm: CTA b/l GI: Soft, NT/ND NEURO:  Alert & Oriented x 3   Laurell Pond, MD [facility]  08/21/2023 9:38 AM

## 2023-08-21 NOTE — Progress Notes (Signed)
 Vss nad trans to pacu

## 2023-08-21 NOTE — Progress Notes (Signed)
 Pt's states no medical or surgical changes since previsit or office visit.

## 2023-08-21 NOTE — Op Note (Signed)
 [facility] Patient Name: Dawn Griffin Procedure Date: 08/21/2023 9:45 AM MRN: [id number] Endoscopist: Nannette Babe , MD, [id number] Age: 53 Referring MD:  Date of Birth: 05-07-1970 Gender: Female Account #: [id number] Procedure:                Upper GI endoscopy Indications:              Gastro-esophageal reflux disease, Abnormal CT of                            the GI tract (stomach) Medicines:                Monitored Anesthesia Care Procedure:                Pre-Anesthesia Assessment:                           - Prior to the procedure, a History and Physical                            was performed, and patient medications and                            allergies were reviewed. The patient's tolerance of                            previous anesthesia was also reviewed. The risks                            and benefits of the procedure and the sedation                            options and risks were discussed with the patient.                            All questions were answered, and informed consent                            was obtained. Prior Anticoagulants: The patient has                            taken no anticoagulant or antiplatelet agents. ASA                            Grade Assessment: II - A patient with mild systemic                            disease. After reviewing the risks and benefits,                            the patient was deemed in satisfactory condition to                            undergo the procedure.  After obtaining informed consent, the endoscope was                            passed under direct vision. Throughout the                            procedure, the patient's blood pressure, pulse, and                            oxygen saturations were monitored continuously. The                            Olympus Scope [id number] was introduced through the                            mouth, and advanced to the  second part of duodenum.                            The upper GI endoscopy was accomplished without                            difficulty. The patient tolerated the procedure                            well. Scope In: Scope Out: Findings:                 The examined esophagus was normal.                           The entire examined stomach was normal.                           The examined duodenum was normal. Complications:            No immediate complications. Estimated Blood Loss:     Estimated blood loss: none. Impression:               - Normal esophagus.                           - Normal stomach.                           - Normal examined duodenum.                           - No specimens collected. Recommendation:           - Patient has a contact number available for                            emergencies. The signs and symptoms of potential                            delayed complications were discussed with the  patient. Return to normal activities tomorrow.                            Written discharge instructions were provided to the                            patient.                           - Resume previous diet.                           - Continue present medications.                           - See the other procedure note for documentation of                            additional recommendations. Nannette Babe, MD 08/21/2023 10:11:41 AM This report has been signed electronically.

## 2023-08-21 NOTE — Patient Instructions (Signed)
   Handouts provided about diverticulosis and polyps.  Resume previous diet.  Continue present medications.  Await pathology results.  Repeat colonoscopy in 5 years for surveillance.  YOU HAD AN ENDOSCOPIC PROCEDURE TODAY AT THE [facility]:   Refer to the procedure report that was given to you for any specific questions about what was found during the examination.  If the procedure report does not answer your questions, please call your gastroenterologist to clarify.  If you requested that your care partner not be given the details of your procedure findings, then the procedure report has been included in a sealed envelope for you to review at your convenience later.  YOU SHOULD EXPECT: Some feelings of bloating in the abdomen. Passage of more gas than usual.  Walking can help get rid of the air that was put into your GI tract during the procedure and reduce the bloating. If you had a lower endoscopy (such as a colonoscopy or flexible sigmoidoscopy) you may notice spotting of blood in your stool or on the toilet paper. If you underwent a bowel prep for your procedure, you may not have a normal bowel movement for a few days.  Please Note:  You might notice some irritation and congestion in your nose or some drainage.  This is from the oxygen used during your procedure.  There is no need for concern and it should clear up in a day or so.  SYMPTOMS TO REPORT IMMEDIATELY:  Following lower endoscopy (colonoscopy or flexible sigmoidoscopy):  Excessive amounts of blood in the stool  Significant tenderness or worsening of abdominal pains  Swelling of the abdomen that is new, acute  Fever of 100F or higher  Following upper endoscopy (EGD)  Vomiting of blood or coffee ground material  New chest pain or pain under the shoulder blades  Painful or persistently difficult swallowing  New shortness of breath  Fever of 100F or higher  Black, tarry-looking stools  For urgent or  emergent issues, a gastroenterologist can be reached at any hour by calling [phone]. Do not use MyChart messaging for urgent concerns.    DIET:  We do recommend a small meal at first, but then you may proceed to your regular diet.  Drink plenty of fluids but you should avoid alcoholic beverages for 24 hours.  ACTIVITY:  You should plan to take it easy for the rest of today and you should NOT DRIVE or use heavy machinery until tomorrow (because of the sedation medicines used during the test).    FOLLOW UP: Our staff will call the number listed on your records the next business day following your procedure.  We will call around 7:15- 8:00 am to check on you and address any questions or concerns that you may have regarding the information given to you following your procedure. If we do not reach you, we will leave a message.     If any biopsies were taken you will be contacted by phone or by letter within the next 1-3 weeks.  Please call us  at [phone] if you have not heard about the biopsies in 3 weeks.    SIGNATURES/CONFIDENTIALITY: You and/or your care partner have signed paperwork which will be entered into your electronic medical record.  These signatures attest to the fact that that the information above on your After Visit Summary has been reviewed and is understood.  Full responsibility of the confidentiality of this discharge information lies with you and/or your care-partner.

## 2023-08-22 ENCOUNTER — Telehealth: Payer: Self-pay

## 2023-08-22 MED ORDER — MUPIROCIN 2 % EX OINT: 1.0000 | TOPICAL_OINTMENT | Freq: Two times a day (BID) | CUTANEOUS | 0 refills | Status: DC

## 2023-08-22 NOTE — Telephone Encounter (Signed)
Post procedure follow up call, left message

## 2023-08-25 NOTE — Progress Notes (Signed)
 Office Visit Note  Patient: Dawn Griffin             Date of Birth: January 22, 1971           MRN: [id number]             PCP: Alvan Dorothyann BIRCH, MD Referring: Alvan Dorothyann BIRCH, * Visit Date: 09/08/2023   Subjective:  Follow-up    Discussed the use of AI scribe software for clinical note transcription with the patient, who gave verbal consent to proceed.  History of Present Illness   Dawn Griffin is a 53 y.o. 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    She has a recent infection in her finger, which developed without any known injury and we reviewed via MyChart communication. A calcification on her finger, present for years, suddenly became painful and infected. White discoloration was noted around the area, and she drained the infection herself by puncturing it with a pen. Since then, the skin has been peeling off, which has persisted over the past two weeks.  Her knees are swollen and painful, especially after physical activity, with more pronounced swelling in one knee compared to the other. She notes a partial but significant improvement since the addition of azathioprine .  She has undergone multiple surgeries in the past, including three surgeries on her hands, resulting in limited function and sensation in some fingers never regained much sensation in ulnar nerve distribution.  She follows up with a gastroenterologist for routine colonoscopies, with the last one being normal. No recent illnesses or infections aside from the finger issue. Her stomach condition is stable.       Previous HPI 07/03/2023 Dawn Griffin is a 53 y.o. 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  So far she has not noticed any side effect taking the Imuran  but also not much benefit continues having  joint pain and stiffness in multiple areas.  Despite the ultrasound-guided injection, nerve decompression surgery, and revision after wound dehiscence with local infection she still has ongoing pain and numbness in the left 4th and 5th fingers.  She discontinued gabapentin  due to no significant benefit despite taking it for several months.  She was still on the Cymbalta  at 30 mg daily which was being tapered down from 60 as treatment for her lumbar spondylosis not sure whether this is helping things or not now.  Besides the body aches activity is also limited by shortness of breath with exertion.  She had recent follow-up with Madelin Birk in pulmonology clinic did not find the evidence of active lung inflammation or restrictive defect on PFTs.   04/04/2023 Dawn Griffin is a 53 y.o. 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 weekly and folic acid  1 mg daily.  The patient is now scheduled for a second surgery involving a transposition of the ulnar nerve and a possible tendon lengthening due to lack of resolution with the cubital tunnel release surgery.   In the past three weeks, the patient has experienced increased pain in the elbow, describing it as sore to touch and painful when lifting objects. The pain is localized under the elbow, on the bone, and does not appear to be nerve-related. The patient also reports stiffness and  soreness in the right hand, particularly in the morning, and numbness in the right hand with any significant use.   The patient has a history of carpal tunnel syndrome, which has been managed with a brace. However, the patient reports that the carpal tunnel symptoms have worsened, with numbness upon waking. The patient also reports skin discoloration and scabbing on one finger, which has been persistent.   In addition to the hand and elbow issues, the patient reports swelling in the knees  upon waking, which subsides throughout the day. The patient also reports a return of sciatic pain, particularly in the mornings. The patient has a history of back problems, which were previously managed with cyclobenzaprine .   The patient has been on methotrexate  for an extended period, but reports no noticeable improvement from the medication. Recent liver enzyme tests have shown slightly elevated levels, which may be related to the methotrexate . The patient has also been on Orencia , which initially seemed to help but has not provided significant relief recently.    01/02/2023 Dawn Griffin is a 53 y.o. 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 weekly and folic acid  1 mg daily.  Unfortunately recent complications with her left elbow she went for decompression surgery for cubital tunnel syndrome.  This is complicated by then falling onto her elbow causing wound dehiscence with associated purulent drainage.  Initially was treated with Steri-Strips occurring at ED but follow-up in orthopedics office with additional washout planned later today.  Due to the surgeries and associated infection complication she has been off medication has had some increase in symptoms particularly knee pain and swelling.   Previous HPI 10/02/2022 Dawn Griffin is a 53 y.o. female here for follow up for RA/CREST overlap on Orencia  125 mg subcu weekly hydroxychloroquine  300 mg daily and methotrexate  10 mg p.o. weekly and folic acid  1 mg daily.  Since her last visit she became sick with viral upper respiratory symptoms about a month ago these lingered for about 3 weeks with congestion drainage and cough no fevers.  Treatment cleared up and she went to the beach at [facility] with increased physical activity there did have some flareup of bilateral knee swelling worse on the right side started taking prednisone  taper for this  initially 20 mg daily down to 10.  Not having any ongoing pulmonary symptoms.   Previous HPI 07/03/2022 Dawn Griffin is a 53 y.o. female here for follow up for RA/CREST overlap on Orencia  125 mg subcu weekly hydroxychloroquine  300 mg daily and methotrexate  10 mg p.o. weekly and folic acid  1 mg daily.  Overall she has been doing pretty well a few symptoms have acted up feels she is having some swelling in the right knee particularly first thing in the mornings that is more than previous.  Is also noticing some difficulty fully flexing the right index finger and occasionally gets a rubbing or popping sensation.  Has noticed new pitting lesions in the fingertips especially over the wintertime.   Previous HPI 12/31/21 Dawn Griffin is a 53 y.o. female here for follow up for RA/CREST overlap on Orencia  125 mg subcu weekly hydroxychloroquine  300 mg daily and methotrexate  10 mg p.o. weekly and folic acid  1 mg daily.  Overall has been doing fairly well since her last visit without major symptom exacerbations.  She was switched on medicines for the pancreatic insufficiency due to loss of efficacy on her previous treatment  and is doing well again.  She had some basal cell cancers excised uneventfully.  Continues to have joint pain affecting the right shoulder low back and left hip has not seen peripheral joint swelling come back.  Raynaud's symptoms have been reasonably controlled she is taking the ADHD medications only as needed.   Previous HPI 06/27/2021 Dawn Griffin is a 53 y.o. female here for follow up for RA/CREST overlap on orencia  125 mg [place] weekly, HCQ 300 mg daily, and MTX 10 mg PO weekly.  Her RA has been doing well followed up with her doctors in [place] felt this is stable.  She saw Dr. Jorizzo with no new concerning findings on skin exam at that time recommended to continue medications including the methotrexate . She has increase in low back pain since around Christmas time she thinks this  was some type of overuse or injury when carrying kids around.  Since this started she had severe pain with left leg symptoms with numbness and pain going down the leg and in the foot.  She had updated imaging of her low back in January and epidural shot performed was very helpful for the radicular symptoms.  She has scheduled upcoming appointments with orthopedic surgery and neurosurgery about more definitive management options for this problem.   Previous HPI 02/26/21 Dawn Griffin is a 53 y.o. female here for follow up for RA/CREST overlap on orencia  125 mg [place] weekly, HCQ 300 mg daily, MTX 10 mg PO weekly, and folic acid  1 mg daily. We decreased methotrexate  dose after last visit due to elevation in liver function tests.  Overall she feels her arthritis and scleroderma symptoms are pretty well controlled.  She is experiencing increased verity of Raynaud's symptoms with some new digital pitting during the colder weather but is very proactive with coats gloves and hot hands heating packs.  Continuing to have good GI symptom improvement with the Creon .  Overall she feels her mood and body aches are just overall worse and feels very down which she states has come and gone periodically in previous months.     Previous HPI 05/25/20 Dawn Griffin is a 53 y.o. female with history of carotid artery aneurysm and centrilobular emphysema here for evaluation of rheumatoid arthritis currently on methotrexate  and orencia  infusion. 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 She was first evaluated with rheumatology around 2014 due to hand pain, stiffness, skin changes, raynaud's, telangiectasias but not started any long term treatments until in 2018 with worsening symptoms was diagnosed with RA. She takes methotrexate  and hydroxychloroquine  for over a year without significant improvement in symptoms, mostly only feeling improvement when on low dose prednisone .  Enbrel was added to her treatment but she does not feel this made a particularly good benefit in symptoms. She start Orencia  infusion IV since about 2 months ago and also has not seen much improvement yet, although tolerating the treatment okay. She does not have much joint swelling most of the time, although in the past when she stopped all medicines for a time large bilateral knee swelling occurred and also hand swelling involving the whole hand and fingers. Doing okay today, currently on 10 mg prednisone  she restarted taking it at 20 mg dose and decreased it feeling a bit worse but currently much better than off the medicine.   She had extensive cardiac workup in December with abnormal EKG and subsequent TTE without any obvious problems and cardiac CT checked with no  coronary disease problems seen. She was also found to have carotid artery aneurysm during evaluation for vagal nerve stimulator placement as a participant with RESET-RA trial.   She has skin thickening and Raynaud's numerous telangiectasias on hands bilaterally.  She is not currently on any medicine for this.  She has a few areas of small pitting or scarring.  She previously did not try calcium channel blocker due to concern of side effects with a fairly low baseline blood pressure.  She has chronic diarrhea symptoms has had colonoscopy that was unremarkable.  She is also had swallowing study but does not describe prior upper GI endoscopy.   She has pulmonary emphysema but with good function on PFTs and is following up regularly with pulmonology.  This has been attributed to prior smoking history which she has to reduce but not stopped entirely.  She does have pretty frequent shortness of breath with exertion but no history of hypoxia or abnormal stress testing or PFTs.  She does not notice significant peripheral edema.   Labs reviewed 04/2016 ANA 1:1280 centromere RF 16.3 CCP 117   Review of Systems  Constitutional:  Negative for  fatigue.  HENT:  Negative for mouth sores and mouth dryness.   Eyes:  Negative for dryness.  Respiratory:  Positive for shortness of breath.   Cardiovascular:  Negative for chest pain and palpitations.  Gastrointestinal:  Negative for blood in stool, constipation and diarrhea.  Endocrine: Negative for increased urination.  Genitourinary:  Negative for involuntary urination.  Musculoskeletal:  Positive for joint pain, joint pain, joint swelling and morning stiffness. Negative for gait problem, myalgias, muscle weakness, muscle tenderness and myalgias.  Skin:  Negative for color change, rash, hair loss and sensitivity to sunlight.  Allergic/Immunologic: Negative for susceptible to infections.  Neurological:  Negative for dizziness and headaches.  Hematological:  Negative for swollen glands.  Psychiatric/Behavioral:  Negative for depressed mood and sleep disturbance. The patient is not nervous/anxious.     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   Rheumatoid arthritis involving both hands with positive rheumatoid factor (HCC) 12/12/2020   Pancreatic insufficiency 10/10/2020   CREST syndrome (HCC) 07/06/2020   GERD (gastroesophageal reflux disease) 07/06/2020   High risk medication use 05/25/2020   Vitamin D  deficiency 05/25/2020   S/P coil embolization of cerebral aneurysm 02/28/2020   Centrilobular emphysema (HCC) 08/04/2019   Chronic fatigue 05/10/2019   Brain fog 05/10/2019   Myofascial pain 06/30/2018   Rheumatoid arthritis (HCC) 05/20/2017   Bilateral carpal tunnel syndrome, left cubital tunnel syndrome 08/17/2013   Arthritis of left carpometacarpal joint 02/26/2013   SOB (shortness of breath) 02/26/2013   ANEMIA, HX OF 01/10/2010   URINARY URGENCY 12/21/2009   Enlarged lymph nodes 08/15/2008    Past Medical  History:  Diagnosis Date   Aneurysm (HCC) 02/2020   Aortic atherosclerosis (HCC)    CREST (calcinosis, Raynaud's phenomenon, esophageal dysfunction, sclerodactyly, telangiectasia) (HCC) 2022   Emphysema of lung (HCC)    GERD (gastroesophageal reflux disease)    IBS (irritable bowel syndrome)    Irritable bowel syndrome with diarrhea    Pancreatic divisum    Raynaud disease    Rheumatoid arthritis (HCC)    Sessile colonic polyp     Family History  Problem Relation Age of Onset   Bipolar disorder Mother  Lung cancer Mother    Lung cancer Father    Gout Father    Healthy Sister    Lupus Sister    Arthritis/Rheumatoid Maternal Grandmother    Stomach cancer Paternal Grandmother    Healthy Brother    Colon cancer Neg Hx    Esophageal cancer Neg Hx    Colon polyps Neg Hx    Rectal cancer Neg Hx    Breast cancer Neg Hx    Past Surgical History:  Procedure Laterality Date   ANEURYSM COILING  02/2020   ELBOW SURGERY Left    FINGER ARTHROSCOPY WITH CARPOMETACARPEL ([facility]) ARTHROPLASTY Left 03/2019   LUMBAR SPINE SURGERY  2001   L4-5-S1   TONSILLECTOMY  1990   ULNAR NERVE TRANSPOSITION Left 04/10/2023   ULNAR TUNNEL RELEASE Left 12/12/2022   VAGUS NERVE STIMULATOR INSERTION  09/2019   Social History   Social History Narrative   No exercise. + daily caffeine.    Immunization History  Administered Date(s) Administered   Influenza Whole 01/10/2010   Tdap 01/11/2013, 03/27/2023     Objective: Vital Signs: BP 112/70 (BP Location: Right Arm, Patient Position: Sitting, Cuff Size: Normal)   Pulse 84   Resp 14   Ht 5' 4 (1.626 m)   Wt 119 lb (54 kg)   BMI 20.43 kg/m    Physical Exam Eyes:     Conjunctiva/sclera: Conjunctivae normal.  Cardiovascular:     Rate and Rhythm: Normal rate and regular rhythm.  Pulmonary:     Effort: Pulmonary effort is normal.     Breath sounds: Normal breath sounds.  Lymphadenopathy:     Cervical: No cervical adenopathy.  Skin:     General: Skin is warm and dry.     Comments: Skin thickening over fingers of both hands distal to MCPs Numerous fingertip and palmar telangiectasias    Neurological:     Mental Status: She is alert.  Psychiatric:        Mood and Affect: Mood normal.      Musculoskeletal Exam:  Shoulders full ROM no tenderness or swelling Left elbow tenderness at medial epicondyle without palpable swelling or radiation Wrists full ROM no tenderness or swelling Fingers full ROM, skin tightness restrictive, MCP and PIP tenderness without palpable synovitis Knees full ROM, right knee effusion present with mild tenderness Ankles full ROM no tenderness or swelling   Investigation: No additional findings.  Imaging: No results found.  Recent Labs: Lab Results  Component Value Date   WBC 6.1 09/08/2023   HGB 13.1 09/08/2023   PLT 248 09/08/2023   NA 139 09/08/2023   K 4.8 09/08/2023   CL 104 09/08/2023   CO2 28 09/08/2023   GLUCOSE 102 (H) 09/08/2023   BUN 15 09/08/2023   CREATININE 0.72 09/08/2023   BILITOT 0.4 09/08/2023   ALKPHOS 63 05/02/2023   AST 38 (H) 09/08/2023   ALT 32 (H) 09/08/2023   PROT 7.0 09/08/2023   ALBUMIN 4.2 05/02/2023   CALCIUM 9.9 09/08/2023   GFRAA 102 09/06/2020   QFTBGOLDPLUS NEGATIVE 04/17/2023    Speciality Comments: PLQ Eye Exam 08/20/2022 normal [place] f/u 1 year  PLQ eye exam scheduled for 10/2023  Procedures:  No procedures performed Allergies: Patient has no known allergies.   Assessment / Plan:     Visit Diagnoses: Rheumatoid arthritis involving both hands with positive rheumatoid factor (HCC)  Scleroderma- Plan: Sedimentation rate, C-reactive protein, predniSONE  (DELTASONE ) 5 MG tablet Inflammatory arthritis currently in exacerbation with knee effusion. Seems to  be triggered with fairly mild increased activity just walking. Otherwise symptoms stable. Plan to continue current regimen unless complication found. - Checking sed rate and CRP for disease  activity monitoring - Continue HCQ 200 mg daily - Continue AZA 100 mg daily - Continue orenca 125 mg [place] weekly - Prescribed prednisone  5 mg for use as needed, especially for anticipated physical activity.  Scleroderma Scleroderma with skin calcifications and compromised blood flow to fingertips, increasing infection risk. Joint pain worsens with physical activity. Steroids effective for rheumatoid arthritis but not preferred for scleroderma  High risk medication use - Orencia  125 mg subcu weekly, hydroxychloroquine  200 mg daily, and Azathioprine  100 mg daily. PLQ Eye Exam 08/20/2022 normal. Needs updated PLQ eye exam - Plan: CBC with Differential/Platelet, Comprehensive metabolic panel with GFR Tolerating medications well overall. No serious interval infections, one incident of possible paronychia or tiny digital ulcer. 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 Retains some finger sensation but limited grip strength and thumb function. Condition unchanged.  Back pain Lumbar spondylosis - Cymbalta  to 60 mg daily Chronic back pain with L5-S1 issues. Discomfort when lying flat and rotated pelvis affecting posture and perceived leg length discrepancy.    Orders: Orders Placed This Encounter  Procedures   Sedimentation rate   C-reactive protein   CBC with Differential/Platelet   Comprehensive metabolic panel with GFR   Meds ordered this encounter  Medications   predniSONE  (DELTASONE ) 5 MG tablet    Sig: Take 4 tablets (20 mg total) by mouth daily as needed.    Dispense:  30 tablet    Refill:  0     Follow-Up Instructions: Return in about 3 months (around 12/09/2023) for RA/CREST on ABA/AZA/HCQ f/u 3mos.   Lonni LELON Ester, MD  Note - This record has been created using [employer].  Chart creation errors have  been sought, but may not always  have been located. Such creation errors do not reflect on  the standard of medical care.

## 2023-08-26 LAB — SURGICAL PATHOLOGY

## 2023-08-28 ENCOUNTER — Ambulatory Visit: Payer: Self-pay | Admitting: Internal Medicine

## 2023-09-07 ENCOUNTER — Other Ambulatory Visit: Payer: Self-pay | Admitting: Internal Medicine

## 2023-09-07 DIAGNOSIS — M47816 Spondylosis without myelopathy or radiculopathy, lumbar region: Secondary | ICD-10-CM

## 2023-09-08 ENCOUNTER — Ambulatory Visit: Attending: Internal Medicine | Admitting: Internal Medicine

## 2023-09-08 ENCOUNTER — Other Ambulatory Visit: Payer: Self-pay | Admitting: Internal Medicine

## 2023-09-08 ENCOUNTER — Encounter: Payer: Self-pay | Admitting: Internal Medicine

## 2023-09-08 VITALS — BP 112/70 | HR 84 | Resp 14 | Ht 64.0 in | Wt 119.0 lb

## 2023-09-08 DIAGNOSIS — M05741 Rheumatoid arthritis with rheumatoid factor of right hand without organ or systems involvement: Secondary | ICD-10-CM

## 2023-09-08 DIAGNOSIS — K8689 Other specified diseases of pancreas: Secondary | ICD-10-CM

## 2023-09-08 DIAGNOSIS — Z79899 Other long term (current) drug therapy: Secondary | ICD-10-CM | POA: Diagnosis not present

## 2023-09-08 DIAGNOSIS — M05742 Rheumatoid arthritis with rheumatoid factor of left hand without organ or systems involvement: Secondary | ICD-10-CM

## 2023-09-08 DIAGNOSIS — M7918 Myalgia, other site: Secondary | ICD-10-CM

## 2023-09-08 DIAGNOSIS — M47816 Spondylosis without myelopathy or radiculopathy, lumbar region: Secondary | ICD-10-CM

## 2023-09-08 MED ORDER — PREDNISONE 5 MG PO TABS: 20.0000 mg | ORAL_TABLET | Freq: Every day | ORAL | 0 refills | Status: DC | PRN

## 2023-09-08 NOTE — Telephone Encounter (Signed)
 Last Fill: 07/03/2023  Labs: 07/03/2023 WNL, new labs were drawn today  Next Visit: 12/09/2023  Last Visit: 09/08/2023  DX: Rheumatoid arthritis involving both hands with positive rheumatoid factor (HCC)   Current Dose per office note 09/08/2023: Azathioprine  100 mg daily  Okay to refill Imuran ?

## 2023-09-09 LAB — COMPREHENSIVE METABOLIC PANEL WITH GFR
AG Ratio: 1.9 (calc) (ref 1.0–2.5)
ALT: 32 U/L — ABNORMAL HIGH (ref 6–29)
AST: 38 U/L — ABNORMAL HIGH (ref 10–35)
Albumin: 4.6 g/dL (ref 3.6–5.1)
Alkaline phosphatase (APISO): 62 U/L (ref 37–153)
BUN: 15 mg/dL (ref 7–25)
CO2: 28 mmol/L (ref 20–32)
Calcium: 9.9 mg/dL (ref 8.6–10.4)
Chloride: 104 mmol/L (ref 98–110)
Creat: 0.72 mg/dL (ref 0.50–1.03)
Globulin: 2.4 g/dL (ref 1.9–3.7)
Glucose, Bld: 102 mg/dL — ABNORMAL HIGH (ref 65–99)
Potassium: 4.8 mmol/L (ref 3.5–5.3)
Sodium: 139 mmol/L (ref 135–146)
Total Bilirubin: 0.4 mg/dL (ref 0.2–1.2)
Total Protein: 7 g/dL (ref 6.1–8.1)
eGFR: 102 mL/min/{1.73_m2} (ref >=60)

## 2023-09-09 LAB — CBC WITH DIFFERENTIAL/PLATELET
Absolute Lymphocytes: 2513 {cells}/uL (ref 850–3900)
Absolute Monocytes: 531 {cells}/uL (ref 200–950)
Basophils Absolute: 49 {cells}/uL (ref 0–200)
Basophils Relative: 0.8 %
Eosinophils Absolute: 189 {cells}/uL (ref 15–500)
Eosinophils Relative: 3.1 %
HCT: 40.8 % (ref 35.0–45.0)
Hemoglobin: 13.1 g/dL (ref 11.7–15.5)
MCH: 30.6 pg (ref 27.0–33.0)
MCHC: 32.1 g/dL (ref 32.0–36.0)
MCV: 95.3 fL (ref 80.0–100.0)
MPV: 10.2 fL (ref 7.5–12.5)
Monocytes Relative: 8.7 %
Neutro Abs: 2818 {cells}/uL (ref 1500–7800)
Neutrophils Relative %: 46.2 %
Platelets: 248 10*3/uL (ref 140–400)
RBC: 4.28 10*6/uL (ref 3.80–5.10)
RDW: 13.8 % (ref 11.0–15.0)
Total Lymphocyte: 41.2 %
WBC: 6.1 10*3/uL (ref 3.8–10.8)

## 2023-09-09 LAB — C-REACTIVE PROTEIN: CRP: <3 mg/L (ref <8.0)

## 2023-09-09 LAB — SEDIMENTATION RATE: Sed Rate: 2 mm/h (ref 0–20)

## 2023-09-12 ENCOUNTER — Other Ambulatory Visit: Payer: Self-pay | Admitting: Internal Medicine

## 2023-09-12 DIAGNOSIS — M0579 Rheumatoid arthritis with rheumatoid factor of multiple sites without organ or systems involvement: Secondary | ICD-10-CM

## 2023-09-15 ENCOUNTER — Ambulatory Visit: Payer: Self-pay | Admitting: Internal Medicine

## 2023-09-15 NOTE — Progress Notes (Signed)
 Sed rate and CRP are normal.  Blood count also remains normal.  Her AST and ALT were slightly elevated at 38 and 32.  These are not as high as previous elevation a couple months ago.  I am not sure if these are related to the azathioprine  or not.  Due to being just barely abnormal I do not think she needs to change her medications and we can monitor this for now.

## 2023-09-15 NOTE — Telephone Encounter (Signed)
 Last Fill: 05/27/2023  Labs: 09/08/2023 CBC is WNL  Glucose 102 AST 38 ALT 32  TB Gold: 04/17/2023 TB Gold Negative   Next Visit: 12/09/2023  Last Visit: 09/08/2023  DX: RA/CREST overlap   Current Dose per office note 09/08/2023: Orencia  125 mg subcu weekly   Okay to refill Orencia ?

## 2023-09-17 ENCOUNTER — Other Ambulatory Visit: Payer: Self-pay | Admitting: Internal Medicine

## 2023-09-17 DIAGNOSIS — M0579 Rheumatoid arthritis with rheumatoid factor of multiple sites without organ or systems involvement: Secondary | ICD-10-CM

## 2023-09-19 ENCOUNTER — Encounter: Payer: Self-pay | Admitting: Internal Medicine

## 2023-09-19 DIAGNOSIS — M47816 Spondylosis without myelopathy or radiculopathy, lumbar region: Secondary | ICD-10-CM

## 2023-09-19 NOTE — Telephone Encounter (Signed)
 Last Fill: 07/03/2023  Next Visit: 12/09/2023  Last Visit: 09/08/2023  Dx: Lumbar spondylosis   Current Dose per office note on 09/08/2023: Cymbalta  to 60 mg daily   Okay to refill Cymbalta ?

## 2023-09-22 ENCOUNTER — Other Ambulatory Visit: Payer: Self-pay | Admitting: Internal Medicine

## 2023-09-22 DIAGNOSIS — M47816 Spondylosis without myelopathy or radiculopathy, lumbar region: Secondary | ICD-10-CM

## 2023-09-22 NOTE — Telephone Encounter (Signed)
 Last Fill: 07/03/2023  Next Visit: 12/09/2023  Last Visit: 09/08/2023  Dx: Lumbar spondylosis   Current Dose per office note on 09/08/2023: Cymbalta  to 60 mg daily   Okay to refill Cymbalta ?

## 2023-10-15 ENCOUNTER — Other Ambulatory Visit: Payer: Self-pay | Admitting: Family Medicine

## 2023-10-15 DIAGNOSIS — Z1231 Encounter for screening mammogram for malignant neoplasm of breast: Secondary | ICD-10-CM

## 2023-10-29 ENCOUNTER — Encounter: Payer: Self-pay | Admitting: Family Medicine

## 2023-11-04 ENCOUNTER — Ambulatory Visit
Admission: RE | Admit: 2023-11-04 | Discharge: 2023-11-04 | Disposition: A | Discharge status: Home / Self Care | Source: Ambulatory Visit | Attending: Family Medicine | Admitting: Family Medicine

## 2023-11-04 DIAGNOSIS — Z1231 Encounter for screening mammogram for malignant neoplasm of breast: Secondary | ICD-10-CM

## 2023-11-07 ENCOUNTER — Ambulatory Visit: Payer: Self-pay | Admitting: Family Medicine

## 2023-11-07 NOTE — Progress Notes (Signed)
 Please call patient. Normal mammogram.  Repeat in 1 year.

## 2023-11-11 ENCOUNTER — Encounter: Payer: Self-pay | Admitting: Sports Medicine

## 2023-11-18 ENCOUNTER — Ambulatory Visit: Admitting: Internal Medicine

## 2023-11-18 ENCOUNTER — Encounter: Payer: Self-pay | Admitting: Internal Medicine

## 2023-11-18 VITALS — BP 100/68 | HR 81 | Ht 64.0 in | Wt 121.0 lb

## 2023-11-18 DIAGNOSIS — K580 Irritable bowel syndrome with diarrhea: Secondary | ICD-10-CM

## 2023-11-18 DIAGNOSIS — R748 Abnormal levels of other serum enzymes: Secondary | ICD-10-CM | POA: Diagnosis not present

## 2023-11-18 DIAGNOSIS — K219 Gastro-esophageal reflux disease without esophagitis: Secondary | ICD-10-CM | POA: Diagnosis not present

## 2023-11-18 DIAGNOSIS — E538 Deficiency of other specified B group vitamins: Secondary | ICD-10-CM

## 2023-11-18 DIAGNOSIS — Z860100 Personal history of colon polyps, unspecified: Secondary | ICD-10-CM

## 2023-11-18 DIAGNOSIS — Z860101 Personal history of adenomatous and serrated colon polyps: Secondary | ICD-10-CM

## 2023-11-18 NOTE — Patient Instructions (Addendum)
 Continue 1/2 tablet Viberzi  daily and famotidine 40 mg twice daily.   _______________________________________________________  If your blood pressure at your visit was 140/90 or greater, please contact your primary care physician to follow up on this.  _______________________________________________________  If you are age 51 or older, your body mass index should be between 23-30. Your Body mass index is 20.77 kg/m. If this is out of the aforementioned range listed, please consider follow up with your Primary Care Provider.  If you are age 28 or younger, your body mass index should be between 19-25. Your Body mass index is 20.77 kg/m. If this is out of the aformentioned range listed, please consider follow up with your Primary Care Provider.   ________________________________________________________  The [name] would like to encourage you to use MYCHART to communicate with providers for non-urgent requests or questions.  Due to long hold times on the telephone, sending your provider a message by [name] may be a faster and more efficient way to get a response.  Please allow 48 business hours for a response.  Please remember that this is for non-urgent requests.  _______________________________________________________  [name] is using a team-based approach to care.  Your team is made up of your doctor and two to three APPS. Our APPS (Nurse Practitioners and Physician Assistants) work with your physician to ensure care continuity for you. They are fully qualified to address your health concerns and develop a treatment plan. They communicate directly with your gastroenterologist to care for you. Seeing the Advanced Practice Practitioners on your physician's team can help you by facilitating care more promptly, often allowing for earlier appointments, access to diagnostic testing, procedures, and other specialty referrals.

## 2023-11-18 NOTE — Progress Notes (Signed)
 Subjective:    Patient ID: Dawn Griffin, female    DOB: 11-22-1970, 53 y.o.   MRN: [id number]  HPI Dawn Griffin is a 53 year old female with a history IBS-D, GERD, sessile serrated polyps and diverticulosis who presents for follow-up after recent endoscopy and colonoscopy.  She also has a history of rheumatoid arthritis and CREST.  She underwent an upper endoscopy and colonoscopy on August 21, 2023. The endoscopy showed a normal esophagus, stomach, and duodenum. The colonoscopy, performed for surveillance due to a history of a sessile serrated polyp greater than a centimeter found in August 2022, revealed three polyps ranging from three to six millimeters in size.  She uses Viberzi  for gastrointestinal symptoms, initially taking 75 mg every other day, which resulted in constipation on the day of intake and diarrhea on the off day. She has adjusted the dose to half a pill daily, which has improved her symptoms, providing more regular bowel movements. She has a surplus of the medication at home.  For reflux, she is taking famotidine 40 mg twice daily, which effectively controls her symptoms.  Her liver enzymes have been slightly elevated, leading to discontinuation of methotrexate  after 15 years of use. She is currently on azathioprine , Orencia , and hydroxychloroquine  for rheumatoid arthritis and lupus. Her inflammation markers are low, with an ESR of 2.  She has a history of rheumatoid arthritis, lupus, and scleroderma (CREST syndrome). She participated in a study involving a vagus nerve stimulator, which helps reduce TNF production, and she experiences minimal inflammation markers. However, she continues to experience skin tightness and ulcers due to scleroderma, which affects her grip strength.  She will soon follow-up with Dr. Jeannetta.   Review of Systems As per HPI, otherwise negative  Current Medications, Allergies, Past Medical History, Past Surgical History, Family History and Social  History were reviewed in [employer] record.    Objective:   Physical Exam BP 100/68   Pulse 81   Ht 5' 4 (1.626 m)   Wt 121 lb (54.9 kg)   BMI 20.77 kg/m  Gen: awake, alert, NAD HEENT: anicteric  Neuro: nonfocal  LABS AST: 38 (09/08/2023) ALT: 32 (09/08/2023) Total bilirubin: 0.4 (09/08/2023) Hemoglobin: 13.1 (09/08/2023) MCV: 95.3 (09/08/2023) Platelet count: 248 (09/08/2023) CRP: <3 (09/08/2023) ESR: 2 (09/08/2023)  RADIOLOGY CT scan: Atherosclerotic calcification in superior origin of left renal artery without stenosis, dependent curvilinear high density within stomach, subcentimeter hypodense mass in inferior right liver likely benign cyst, normal pancreas (05/23/2023)  DIAGNOSTIC EGD: Normal esophagus, stomach, and duodenum (08/21/2023) Colonoscopy: Three polyps 3-6 mm, sigmoid diverticulosis, benign hyperplastic polyps (08/21/2023)      Assessment & Plan:  53 year old female with a history IBS-D, GERD, sessile serrated polyps and diverticulosis who presents for follow-up  Irritable bowel syndrome with diarrhea (IBS-D) IBS-D managed with Viberzi . Half tablet daily normalizes bowel movements. - Continue Viberzi  at 37.5 mg  daily. - Do not refill Viberzi  prescription until current supply is depleted.  Gastroesophageal reflux disease (GERD) GERD controlled with famotidine 40 mg twice daily. - Continue famotidine 40 mg twice daily.  Sigmoid diverticulosis Sigmoid diverticulosis identified during colonoscopy. No acute issues.  Personal history of colonic polyps (surveillance post-sessile serrated polyp removal) Surveillance colonoscopy showed three benign hyperplastic polyps. - Schedule repeat colonoscopy in June 2030.  Elevated liver enzymes Slightly elevated liver enzymes likely medication-related. Monitored by rheumatologist. - Felt secondary to methotrexate , she follows up with Dr. Jeannetta soon.  B12 deficiency -Continue IM B12  therapy

## 2023-11-26 ENCOUNTER — Other Ambulatory Visit: Payer: Self-pay | Admitting: Internal Medicine

## 2023-11-26 DIAGNOSIS — M05741 Rheumatoid arthritis with rheumatoid factor of right hand without organ or systems involvement: Secondary | ICD-10-CM

## 2023-11-26 NOTE — Telephone Encounter (Signed)
 Last Fill: 07/03/2023  Eye exam: 10/24/2023  normal    Labs: 09/08/2023 Sed rate and CRP are normal. Blood count also remains normal. Her AST and ALT were slightly elevated at 38 and 32. These are not as high as previous elevation a couple months ago. I am not sure if these are related to the azathioprine  or not. Due to being just barely abnormal I do not think she needs to change her medications and we can monitor this for now.   Next Visit: 12/09/2023  Last Visit: 09/08/2023  IK:Myzlfjunpi arthritis involving both hands with positive rheumatoid factor   Current Dose per office note 09/08/2023: HCQ 200 mg daily   Okay to refill Plaquenil ?

## 2023-11-27 ENCOUNTER — Other Ambulatory Visit: Payer: Self-pay | Admitting: Internal Medicine

## 2023-11-27 DIAGNOSIS — M05741 Rheumatoid arthritis with rheumatoid factor of right hand without organ or systems involvement: Secondary | ICD-10-CM

## 2023-11-27 NOTE — Telephone Encounter (Signed)
 Last Fill: 09/08/2023  Labs: 09/08/2023 Sed rate and CRP are normal. Blood count also remains normal. Her AST and ALT were slightly elevated at 38 and 32. These are not as high as previous elevation a couple months ago. I am not sure if these are related to the azathioprine  or not. Due to being just barely abnormal I do not think she needs to change her medications and we can monitor this for now.   Next Visit: 12/09/2023  Last Visit: 09/08/2023  DX: Rheumatoid arthritis involving both hands with positive rheumatoid factor   Current Dose per office note 09/08/2023: Azathioprine  100 mg daily   Okay to refill Imuran ?

## 2023-11-27 NOTE — Progress Notes (Signed)
 Office Visit Note  Patient: Dawn Griffin             Date of Birth: [birth year]           MRN: [id number]             PCP: Alvan Dorothyann BIRCH, MD Referring: Alvan Dorothyann BIRCH, * Visit Date: 12/09/2023   Subjective:  Medical Management of Chronic Issues   Discussed the use of AI scribe software for clinical note transcription with the patient, who gave verbal consent to proceed.  History of Present Illness   Dawn Griffin is a 53 y.o. 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    She has been using a vagal nerve stimulator, implanted by [facility] in [place], and travels there every three months for follow-up. She is considering discontinuing Orencia , which she started at the same time as the implant, to evaluate the stimulator's effectiveness independently but has never discontinued biologic therapy since getting it.   She experiences persistent issues with her elbow, describing it as unchanged with no improvement. She has weakness in her hands, making it difficult to grip or open items, and notes swelling and numbness in her fingers. Her thumb is non-functional, and she has difficulty opening water bottles. She uses aids like a gripper pad and a device under her counter to assist with opening bottles.  She wants to reduce her medication load, particularly Orencia , as she feels better since the stimulator was implanted. Her inflammation markers have been low, but she experiences morning swelling in her fingers, which she describes as 'sausages'.  She describes calcifications on her hands, particularly in one finger, which she picks at to remove hard layers that cause pain. These calcifications are starting to appear in other areas of her hands.      Previous HPI 09/08/2023 Dawn Griffin is a 53 y.o. 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     She has a recent infection in her finger, which developed without any known injury and we reviewed via MyChart communication. A calcification on her finger, present for years, suddenly became painful and infected. White discoloration was noted around the area, and she drained the infection herself by puncturing it with a pen. Since then, the skin has been peeling off, which has persisted over the past two weeks.   Her knees are swollen and painful, especially after physical activity, with more pronounced swelling in one knee compared to the other. She notes a partial but significant improvement since the addition of azathioprine .   She has undergone multiple surgeries in the past, including three surgeries on her hands, resulting in limited function and sensation in some fingers never regained much sensation in ulnar nerve distribution.   She follows up with a gastroenterologist for routine colonoscopies, with the last one being normal. No recent illnesses or infections aside from the finger issue. Her stomach condition is stable.         Previous HPI 07/03/2023 Dawn Griffin is a 53 y.o. 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  So far she has not noticed any side effect taking the Imuran  but also not much benefit continues having joint pain and stiffness in multiple areas.  Despite the ultrasound-guided injection, nerve decompression surgery, and revision after wound dehiscence with local infection she still has ongoing pain and numbness in the left 4th and 5th fingers.  She discontinued gabapentin  due to no significant benefit despite taking it for several months.  She was still on the Cymbalta  at 30 mg daily which was being tapered down from 60 as treatment for her lumbar spondylosis not sure whether this is helping  things or not now.  Besides the body aches activity is also limited by shortness of breath with exertion.  She had recent follow-up with Madelin Birk in pulmonology clinic did not find the evidence of active lung inflammation or restrictive defect on PFTs.   04/04/2023 Dawn Griffin is a 53 y.o. 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 weekly and folic acid  1 mg daily.  The patient is now scheduled for a second surgery involving a transposition of the ulnar nerve and a possible tendon lengthening due to lack of resolution with the cubital tunnel release surgery.   In the past three weeks, the patient has experienced increased pain in the elbow, describing it as sore to touch and painful when lifting objects. The pain is localized under the elbow, on the bone, and does not appear to be nerve-related. The patient also reports stiffness and soreness in the right hand, particularly in the morning, and numbness in the right hand with any significant use.   The patient has a history of carpal tunnel syndrome, which has been managed with a brace. However, the patient reports that the carpal tunnel symptoms have worsened, with numbness upon waking. The patient also reports skin discoloration and scabbing on one finger, which has been persistent.   In addition to the hand and elbow issues, the patient reports swelling in the knees upon waking, which subsides throughout the day. The patient also reports a return of sciatic pain, particularly in the mornings. The patient has a history of back problems, which were previously managed with cyclobenzaprine .   The patient has been on methotrexate  for an extended period, but reports no noticeable improvement from the medication. Recent liver enzyme tests have shown slightly elevated levels, which may be related to the methotrexate . The patient has also been on Orencia , which  initially seemed to help but has not provided significant relief recently.    01/02/2023 Dawn Griffin is a 53 y.o. 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 weekly and folic acid  1 mg daily.  Unfortunately recent complications with her left elbow she went for decompression surgery for cubital tunnel syndrome.  This is complicated by then falling onto her elbow causing wound dehiscence with associated purulent drainage.  Initially was treated with Steri-Strips occurring at ED but follow-up in orthopedics office with additional washout planned later today.  Due to the surgeries and associated infection complication she has been off medication has had some increase in symptoms particularly knee pain and swelling.   Previous HPI 10/02/2022 Dawn Griffin is a 53 y.o. female here for follow up for RA/CREST overlap on Orencia  125 mg subcu weekly hydroxychloroquine  300 mg daily and methotrexate  10 mg p.o. weekly and folic acid  1 mg daily.  Since her last visit she became sick with viral upper respiratory symptoms about a month ago these lingered for about 3 weeks with congestion  drainage and cough no fevers.  Treatment cleared up and she went to the beach at [place] with increased physical activity there did have some flareup of bilateral knee swelling worse on the right side started taking prednisone  taper for this initially 20 mg daily down to 10.  Not having any ongoing pulmonary symptoms.   Previous HPI 07/03/2022 Dawn Griffin is a 53 y.o. female here for follow up for RA/CREST overlap on Orencia  125 mg subcu weekly hydroxychloroquine  300 mg daily and methotrexate  10 mg p.o. weekly and folic acid  1 mg daily.  Overall she has been doing pretty well a few symptoms have acted up feels she is having some swelling in the right knee particularly first thing in the mornings that is more than previous.  Is  also noticing some difficulty fully flexing the right index finger and occasionally gets a rubbing or popping sensation.  Has noticed new pitting lesions in the fingertips especially over the wintertime.   Previous HPI 12/31/21 Dawn Griffin is a 53 y.o. female here for follow up for RA/CREST overlap on Orencia  125 mg subcu weekly hydroxychloroquine  300 mg daily and methotrexate  10 mg p.o. weekly and folic acid  1 mg daily.  Overall has been doing fairly well since her last visit without major symptom exacerbations.  She was switched on medicines for the pancreatic insufficiency due to loss of efficacy on her previous treatment and is doing well again.  She had some basal cell cancers excised uneventfully.  Continues to have joint pain affecting the right shoulder low back and left hip has not seen peripheral joint swelling come back.  Raynaud's symptoms have been reasonably controlled she is taking the ADHD medications only as needed.   Previous HPI 06/27/2021 Dawn Griffin is a 53 y.o. female here for follow up for RA/CREST overlap on orencia  125 mg Wittmann weekly, HCQ 300 mg daily, and MTX 10 mg PO weekly.  Her RA has been doing well followed up with her doctors in [place] felt this is stable.  She saw Dr. Jorizzo with no new concerning findings on skin exam at that time recommended to continue medications including the methotrexate . She has increase in low back pain since around Christmas time she thinks this was some type of overuse or injury when carrying kids around.  Since this started she had severe pain with left leg symptoms with numbness and pain going down the leg and in the foot.  She had updated imaging of her low back in January and epidural shot performed was very helpful for the radicular symptoms.  She has scheduled upcoming appointments with orthopedic surgery and neurosurgery about more definitive management options for this problem.   Previous HPI 02/26/21 Dawn Griffin is a 53  y.o. female here for follow up for RA/CREST overlap on orencia  125 mg  weekly, HCQ 300 mg daily, MTX 10 mg PO weekly, and folic acid  1 mg daily. We decreased methotrexate  dose after last visit due to elevation in liver function tests.  Overall she feels her arthritis and scleroderma symptoms are pretty well controlled.  She is experiencing increased verity of Raynaud's symptoms with some new digital pitting during the colder weather but is very proactive with coats gloves and hot hands heating packs.  Continuing to have good GI symptom improvement with the Creon .  Overall she feels her mood and body aches are just overall worse and feels very down which she states has come and gone periodically in previous months.  Previous HPI 05/25/20 Dawn Griffin is a 53 y.o. female with history of carotid artery aneurysm and centrilobular emphysema here for evaluation of rheumatoid arthritis currently on methotrexate  and orencia  infusion. 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 She was first evaluated with rheumatology around 2014 due to hand pain, stiffness, skin changes, raynaud's, telangiectasias but not started any long term treatments until in 2018 with worsening symptoms was diagnosed with RA. She takes methotrexate  and hydroxychloroquine  for over a year without significant improvement in symptoms, mostly only feeling improvement when on low dose prednisone . Enbrel was added to her treatment but she does not feel this made a particularly good benefit in symptoms. She start Orencia  infusion IV since about 2 months ago and also has not seen much improvement yet, although tolerating the treatment okay. She does not have much joint swelling most of the time, although in the past when she stopped all medicines for a time large bilateral knee swelling occurred and also hand swelling involving the whole hand and fingers. Doing okay today, currently on 10 mg  prednisone  she restarted taking it at 20 mg dose and decreased it feeling a bit worse but currently much better than off the medicine.   She had extensive cardiac workup in December with abnormal EKG and subsequent TTE without any obvious problems and cardiac CT checked with no coronary disease problems seen. She was also found to have carotid artery aneurysm during evaluation for vagal nerve stimulator placement as a participant with RESET-RA trial.   She has skin thickening and Raynaud's numerous telangiectasias on hands bilaterally.  She is not currently on any medicine for this.  She has a few areas of small pitting or scarring.  She previously did not try calcium channel blocker due to concern of side effects with a fairly low baseline blood pressure.  She has chronic diarrhea symptoms has had colonoscopy that was unremarkable.  She is also had swallowing study but does not describe prior upper GI endoscopy.   She has pulmonary emphysema but with good function on PFTs and is following up regularly with pulmonology.  This has been attributed to prior smoking history which she has to reduce but not stopped entirely.  She does have pretty frequent shortness of breath with exertion but no history of hypoxia or abnormal stress testing or PFTs.  She does not notice significant peripheral edema.   Labs reviewed 04/2016 ANA 1:1280 centromere RF 16.3 CCP 117   Review of Systems  Constitutional:  Positive for fatigue.  HENT:  Negative for mouth sores and mouth dryness.   Eyes:  Negative for dryness.  Respiratory:  Negative for shortness of breath.   Cardiovascular:  Negative for chest pain and palpitations.  Gastrointestinal:  Negative for blood in stool, constipation and diarrhea.  Endocrine: Negative for increased urination.  Genitourinary:  Negative for involuntary urination.  Musculoskeletal:  Positive for joint pain, joint pain, joint swelling, muscle weakness, morning stiffness and muscle  tenderness. Negative for gait problem, myalgias and myalgias.  Skin:  Positive for color change. Negative for rash, hair loss and sensitivity to sunlight.  Allergic/Immunologic: Negative for susceptible to infections.  Neurological:  Negative for dizziness and headaches.  Hematological:  Negative for swollen glands.  Psychiatric/Behavioral:  Negative for depressed mood and sleep disturbance. The patient is not nervous/anxious.     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   GERD (gastroesophageal reflux disease) 07/06/2020   High risk medication use 05/25/2020   Vitamin D  deficiency 05/25/2020   S/P coil embolization of cerebral aneurysm 02/28/2020   Centrilobular emphysema (HCC) 08/04/2019   Chronic fatigue 05/10/2019   Brain fog 05/10/2019   Myofascial pain 06/30/2018   Rheumatoid arthritis (HCC) 05/20/2017   Bilateral carpal tunnel syndrome, left cubital tunnel syndrome 08/17/2013   Arthritis of left carpometacarpal joint 02/26/2013   SOB (shortness of breath) 02/26/2013   ANEMIA, HX OF 01/10/2010   URINARY URGENCY 12/21/2009   Enlarged lymph nodes 08/15/2008    Past Medical History:  Diagnosis Date   Aneurysm 02/2020   Aortic atherosclerosis    CREST (calcinosis, Raynaud's phenomenon, esophageal dysfunction, sclerodactyly, telangiectasia) (HCC) 2022   Emphysema of lung (HCC)    GERD (gastroesophageal reflux disease)    IBS (irritable bowel syndrome)    Irritable bowel syndrome with diarrhea    Pancreatic divisum    Raynaud disease    Rheumatoid arthritis (HCC)    Sessile colonic polyp     Family History  Problem Relation Age of Onset   Bipolar  disorder Mother    Lung cancer Mother    Lung cancer Father    Gout Father    Healthy Sister    Lupus Sister    Arthritis/Rheumatoid Maternal Grandmother    Stomach cancer Paternal Grandmother    Healthy Brother    Colon cancer Neg Hx    Esophageal cancer Neg Hx    Colon polyps Neg Hx    Rectal cancer Neg Hx    Breast cancer Neg Hx    Past Surgical History:  Procedure Laterality Date   ANEURYSM COILING  02/2020   BRAIN SURGERY  02/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 + daily caffeine.    Immunization History  Administered Date(s) Administered   Influenza Whole 01/10/2010   Tdap 01/11/2013, 03/27/2023     Objective: Vital Signs: BP 122/63   Pulse 80   Temp 98 F (36.7 C)   Resp 16   Ht 5' 4 (1.626 m)   Wt 121 lb 9.6 oz (55.2 kg)   BMI 20.87 kg/m    Physical Exam Eyes:     Conjunctiva/sclera: Conjunctivae normal.  Cardiovascular:     Rate and Rhythm: Normal rate and regular rhythm.  Pulmonary:     Effort: Pulmonary effort is normal.     Breath sounds: Normal breath sounds.  Lymphadenopathy:     Cervical: No cervical adenopathy.  Skin:    General: Skin is warm and dry.     Comments: Skin thickening over fingers of both hands distal to MCPs Numerous fingertip and palmar telangiectasias, some on lip border No current pitting or scabs  Neurological:     Mental Status: She is alert.  Psychiatric:  Mood and Affect: Mood normal.      Musculoskeletal Exam:  Shoulders full ROM no tenderness or swelling Left elbow tenderness at medial epicondyle without palpable swelling or radiation Wrists  full ROM no tenderness or swelling Fingers full ROM, skin tightness restrictive, MCP and PIP tenderness without palpable synovitis, left thumb squaring and MCP subluxation Knees full ROM, no effusions, patellofemoral crepitus present Ankles full ROM no tenderness or swelling   Investigation: No additional findings.  Imaging: No results found.   Recent Labs: Lab Results  Component Value Date   WBC 6.1 09/08/2023   HGB 13.1 09/08/2023   PLT 248 09/08/2023   NA 139 09/08/2023   K 4.8 09/08/2023   CL 104 09/08/2023   CO2 28 09/08/2023   GLUCOSE 102 (H) 09/08/2023   BUN 15 09/08/2023   CREATININE 0.72 09/08/2023   BILITOT 0.4 09/08/2023   ALKPHOS 63 05/02/2023   AST 38 (H) 09/08/2023   ALT 32 (H) 09/08/2023   PROT 7.0 09/08/2023   ALBUMIN 4.2 05/02/2023   CALCIUM 9.9 09/08/2023   GFRAA 102 09/06/2020   QFTBGOLDPLUS NEGATIVE 04/17/2023    Speciality Comments: PLQ Eye Exam 10/24/2023  normal Groat f/u 1 year  Procedures:  No procedures performed Allergies: Patient has no known allergies.   Assessment / Plan:     Visit Diagnoses: Rheumatoid arthritis involving both hands with positive rheumatoid factor (HCC) - Plan: Sedimentation rate Chronic joint changes with thumb osteoarthritis and hand weakness. Low inflammation markers, persistent symptoms. Discussed discontinuing Orencia  to assess vagal nerve stimulator efficacy. Explained potential flare-ups due to Orencia 's pharmacokinetics. Vagal nerve stimulator may reduce TNF signaling. - Checking sed rate for disease activity monitoring - Discontinue orencia , symptom monitoring can resume if flares up maybe in 1-3 month inteval after cessation typical - Continue HCQ 200 mg daily - Continue AZA 100 mg daily  High risk medication use - Orencia  125 mg subcu weekly, hydroxychloroquine  200 mg daily, and Azathioprine  100 mg daily. - Plan: CBC with Differential/Platelet, Comprehensive metabolic panel with GFR Tolerating medications  oka without side effect and no serious interval infections. 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 Reports numbness, weakness, and hard orange layer on fingers. No active inflammation, but chronic changes persist.        Orders: Orders Placed This Encounter  Procedures   CBC with Differential/Platelet   Comprehensive metabolic panel with GFR   Sedimentation rate   No orders of the defined types were placed in this encounter.    Follow-Up Instructions: Return in about 3 months (around 03/09/2024) for RA/CREST on HCQ/AZA f/u 3mos.   Lonni LELON Ester, MD  Note - This record has been created using [employer].  Chart creation errors have been sought, but may not always  have been located. Such creation errors do not reflect on  the standard of medical care.

## 2023-11-30 ENCOUNTER — Other Ambulatory Visit: Payer: Self-pay | Admitting: Internal Medicine

## 2023-11-30 DIAGNOSIS — M47816 Spondylosis without myelopathy or radiculopathy, lumbar region: Secondary | ICD-10-CM

## 2023-12-01 NOTE — Telephone Encounter (Signed)
 Last Fill: 09/22/2023  Next Visit: 12/09/2023  Last Visit: 09/08/2023  DX: Lumbar spondylosis   Current Dose per office note on 09/08/2023: Cymbalta  to 60 mg daily   Okay to refill cymbalta ?

## 2023-12-09 ENCOUNTER — Ambulatory Visit: Attending: Internal Medicine | Admitting: Internal Medicine

## 2023-12-09 ENCOUNTER — Encounter: Payer: Self-pay | Admitting: Internal Medicine

## 2023-12-09 VITALS — BP 122/63 | HR 80 | Temp 98.0°F | Resp 16 | Ht 64.0 in | Wt 121.6 lb

## 2023-12-09 DIAGNOSIS — M05741 Rheumatoid arthritis with rheumatoid factor of right hand without organ or systems involvement: Secondary | ICD-10-CM | POA: Diagnosis not present

## 2023-12-09 DIAGNOSIS — Z79899 Other long term (current) drug therapy: Secondary | ICD-10-CM | POA: Diagnosis not present

## 2023-12-09 DIAGNOSIS — K8689 Other specified diseases of pancreas: Secondary | ICD-10-CM | POA: Diagnosis not present

## 2023-12-09 DIAGNOSIS — M05742 Rheumatoid arthritis with rheumatoid factor of left hand without organ or systems involvement: Secondary | ICD-10-CM

## 2023-12-15 ENCOUNTER — Encounter: Payer: Self-pay | Admitting: Family Medicine

## 2023-12-15 DIAGNOSIS — R4189 Other symptoms and signs involving cognitive functions and awareness: Secondary | ICD-10-CM

## 2023-12-15 DIAGNOSIS — Z0000 Encounter for general adult medical examination without abnormal findings: Secondary | ICD-10-CM

## 2023-12-16 MED ORDER — AMPHETAMINE-DEXTROAMPHETAMINE 15 MG PO TABS: 15.0000 mg | ORAL_TABLET | Freq: Two times a day (BID) | ORAL | 0 refills | Status: AC

## 2023-12-25 ENCOUNTER — Telehealth: Payer: Self-pay | Admitting: Internal Medicine

## 2023-12-25 DIAGNOSIS — Z79899 Other long term (current) drug therapy: Secondary | ICD-10-CM

## 2023-12-25 NOTE — Telephone Encounter (Signed)
Labs released per patient request

## 2023-12-25 NOTE — Telephone Encounter (Signed)
 Patient contacted the office requesting lab orders to be be release to [facility] in [place].   Patient plans to have labs today, 12/25/23 at 2:00 pm.

## 2023-12-26 LAB — CBC WITH DIFFERENTIAL/PLATELET
Basophils Absolute: 0.1 x10E3/uL (ref 0.0–0.2)
Basos: 0 %
EOS (ABSOLUTE): 0.1 x10E3/uL (ref 0.0–0.4)
Eos: 1 %
Hematocrit: 40.9 % (ref 34.0–46.6)
Hemoglobin: 13.2 g/dL (ref 11.1–15.9)
Immature Grans (Abs): 0 x10E3/uL (ref 0.0–0.1)
Immature Granulocytes: 0 %
Lymphocytes Absolute: 3.5 x10E3/uL — ABNORMAL HIGH (ref 0.7–3.1)
Lymphs: 29 %
MCH: 32 pg (ref 26.6–33.0)
MCHC: 32.3 g/dL (ref 31.5–35.7)
MCV: 99 fL — ABNORMAL HIGH (ref 79–97)
Monocytes Absolute: 0.5 x10E3/uL (ref 0.1–0.9)
Monocytes: 4 %
Neutrophils Absolute: 7.7 x10E3/uL — ABNORMAL HIGH (ref 1.4–7.0)
Neutrophils: 66 %
Platelets: 327 x10E3/uL (ref 150–450)
RBC: 4.12 x10E6/uL (ref 3.77–5.28)
RDW: 13.2 % (ref 11.7–15.4)
WBC: 11.8 x10E3/uL — ABNORMAL HIGH (ref 3.4–10.8)

## 2023-12-26 LAB — CMP14+EGFR
ALT: 24 IU/L (ref 0–32)
AST: 26 IU/L (ref 0–40)
Albumin: 4.5 g/dL (ref 3.8–4.9)
Alkaline Phosphatase: 54 IU/L (ref 49–135)
BUN/Creatinine Ratio: 18 (ref 9–23)
BUN: 12 mg/dL (ref 6–24)
Bilirubin Total: 0.2 mg/dL (ref 0.0–1.2)
CO2: 23 mmol/L (ref 20–29)
Calcium: 9.7 mg/dL (ref 8.7–10.2)
Chloride: 100 mmol/L (ref 96–106)
Creatinine, Ser: 0.68 mg/dL (ref 0.57–1.00)
Globulin, Total: 2.2 g/dL (ref 1.5–4.5)
Glucose: 114 mg/dL — ABNORMAL HIGH (ref 70–99)
Potassium: 4.1 mmol/L (ref 3.5–5.2)
Sodium: 138 mmol/L (ref 134–144)
Total Protein: 6.7 g/dL (ref 6.0–8.5)
eGFR: 105 mL/min/1.73 (ref >59)

## 2024-01-01 ENCOUNTER — Other Ambulatory Visit: Payer: Self-pay | Admitting: Medical Genetics

## 2024-01-01 DIAGNOSIS — Z006 Encounter for examination for normal comparison and control in clinical research program: Secondary | ICD-10-CM

## 2024-02-03 ENCOUNTER — Encounter: Payer: Self-pay | Admitting: Internal Medicine

## 2024-02-15 ENCOUNTER — Other Ambulatory Visit: Payer: Self-pay | Admitting: Internal Medicine

## 2024-02-15 DIAGNOSIS — M05741 Rheumatoid arthritis with rheumatoid factor of right hand without organ or systems involvement: Secondary | ICD-10-CM

## 2024-02-16 NOTE — Telephone Encounter (Signed)
 Last Fill: 11/27/2023  Labs: 12/23/2023 Glucose 114, WBC 11.8, MCV 99, Neutrophils Absolute 7.7, Lymphocytes Absolute 3.5   Next Visit: 03/17/2024  Last Visit: 12/09/2023  DX: Rheumatoid arthritis involving both hands with positive rheumatoid factor   Current Dose per office note 12/09/2023: Azathioprine  100 mg daily.   Okay to refill Imuran ?

## 2024-02-29 ENCOUNTER — Other Ambulatory Visit: Payer: Self-pay | Admitting: Internal Medicine

## 2024-02-29 DIAGNOSIS — M05741 Rheumatoid arthritis with rheumatoid factor of right hand without organ or systems involvement: Secondary | ICD-10-CM

## 2024-02-29 DIAGNOSIS — M47816 Spondylosis without myelopathy or radiculopathy, lumbar region: Secondary | ICD-10-CM

## 2024-03-01 NOTE — Telephone Encounter (Signed)
 Last Fill: 11/26/2023(PLQ) 12/01/2023(Cymbalta )  Eye exam: 10/24/2023  normal   Labs: 12/25/2023  Next Visit: 03/17/2024  Last Visit: 12/09/2023  DX: Rheumatoid arthritis involving both hands with positive rheumatoid factor   Current Dose per office note 12/09/2023: hydroxychloroquine  200 mg daily , Duloxetine  dose not discussed   Okay to refill Plaquenil  and Cymbalta ?

## 2024-03-08 NOTE — Progress Notes (Signed)
 "  Office Visit Note  Patient: Dawn Griffin             Date of Birth: Nov 07, 1970           MRN: [id number]             PCP: Alvan Dorothyann BIRCH, MD Referring: Alvan Dorothyann BIRCH, MD Visit Date: 03/17/2024   Subjective:  Dawn Griffin is a 53 y.o. 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    She experiences significant hand pain, particularly after increased physical activity such as painting a large barn. Her hands become sore, and she describes 'sausage fingers' in the morning that improve as stiffness decreases. She has been off Orencia  and is currently taking azathioprine  and hydroxychloroquine .  She reports knee pain, especially after climbing ladders, with pain localized to the medial compartment. The pain is described as occurring 'down low and inside' and worsens with increased activity.  She has a history of scleroderma and reports skin issues, including dry, cracked skin on her hands, which she attributes to frequent hand washing. These are described as 'spots' that become dry and crack open.  She experiences back pain and numbness, for which she receives epidural steroid injections every six months, resulting in significant improvement in her back symptoms.  She has a history of Raynaud's phenomenon, noting that her feet no longer swell as they used to, although she still experiences symptoms when exposed to cold.  She had recent labs checked [id number] [phone], no contraindication for continued medications.   Previous HPI 12/09/2023 Dawn Griffin is a 53 y.o. 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     She has been using a vagal nerve stimulator, implanted by [facility] in [place], and travels there every three months for follow-up. She is considering  discontinuing Orencia , which she started at the same time as the implant, to evaluate the stimulator's effectiveness independently but has never discontinued biologic therapy since getting it.    She experiences persistent issues with her elbow, describing it as unchanged with no improvement. She has weakness in her hands, making it difficult to grip or open items, and notes swelling and numbness in her fingers. Her thumb is non-functional, and she has difficulty opening water bottles. She uses aids like a gripper pad and a device under her counter to assist with opening bottles.   She wants to reduce her medication load, particularly Orencia , as she feels better since the stimulator was implanted. Her inflammation markers have been low, but she experiences morning swelling in her fingers, which she describes as 'sausages'.   She describes calcifications on her hands, particularly in one finger, which she picks at to remove hard layers that cause pain. These calcifications are starting to appear in other areas of her hands.       Previous HPI 09/08/2023 Dawn Griffin is a 53 y.o. 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     She has a recent infection in her finger, which developed without any known injury and we reviewed via MyChart communication. A calcification on her finger, present for years, suddenly became painful and infected. White discoloration was noted around the area, and she drained the infection herself by puncturing it  with a pen. Since then, the skin has been peeling off, which has persisted over the past two weeks.   Her knees are swollen and painful, especially after physical activity, with more pronounced swelling in one knee compared to the other. She notes a partial but significant improvement since the addition of azathioprine .   She has undergone multiple surgeries in the  past, including three surgeries on her hands, resulting in limited function and sensation in some fingers never regained much sensation in ulnar nerve distribution.   She follows up with a gastroenterologist for routine colonoscopies, with the last one being normal. No recent illnesses or infections aside from the finger issue. Her stomach condition is stable.         Previous HPI 07/03/2023 Dawn Griffin is a 53 y.o. 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  So far she has not noticed any side effect taking the Imuran  but also not much benefit continues having joint pain and stiffness in multiple areas.  Despite the ultrasound-guided injection, nerve decompression surgery, and revision after wound dehiscence with local infection she still has ongoing pain and numbness in the left 4th and 5th fingers.  She discontinued gabapentin  due to no significant benefit despite taking it for several months.  She was still on the Cymbalta  at 30 mg daily which was being tapered down from 60 as treatment for her lumbar spondylosis not sure whether this is helping things or not now.  Besides the body aches activity is also limited by shortness of breath with exertion.  She had recent follow-up with Madelin Birk in pulmonology clinic did not find the evidence of active lung inflammation or restrictive defect on PFTs.   04/04/2023 Dawn Griffin is a 53 y.o. 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 weekly and folic acid  1 mg daily.  The patient is now scheduled for a second surgery involving a transposition of the ulnar nerve and a possible tendon lengthening due to lack of resolution with the cubital tunnel release surgery.   In the past three weeks, the patient has experienced increased pain in the elbow, describing it as  sore to touch and painful when lifting objects. The pain is localized under the elbow, on the bone, and does not appear to be nerve-related. The patient also reports stiffness and soreness in the right hand, particularly in the morning, and numbness in the right hand with any significant use.   The patient has a history of carpal tunnel syndrome, which has been managed with a brace. However, the patient reports that the carpal tunnel symptoms have worsened, with numbness upon waking. The patient also reports skin discoloration and scabbing on one finger, which has been persistent.   In addition to the hand and elbow issues, the patient reports swelling in the knees upon waking, which subsides throughout the day. The patient also reports a return of sciatic pain, particularly in the mornings. The patient has a history of back problems, which were previously managed with cyclobenzaprine .   The patient has been on methotrexate  for an extended period, but reports no noticeable improvement from the medication. Recent liver enzyme tests have shown slightly elevated levels, which may be related to the methotrexate . The patient has also been on Orencia , which initially seemed to help but has not provided significant  relief recently.    01/02/2023 Dawn Griffin is a 53 y.o. 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 weekly and folic acid  1 mg daily.  Unfortunately recent complications with her left elbow she went for decompression surgery for cubital tunnel syndrome.  This is complicated by then falling onto her elbow causing wound dehiscence with associated purulent drainage.  Initially was treated with Steri-Strips occurring at ED but follow-up in orthopedics office with additional washout planned later today.  Due to the surgeries and associated infection complication she has been off medication has had some increase  in symptoms particularly knee pain and swelling.   Previous HPI 10/02/2022 Dawn Griffin is a 53 y.o. female here for follow up for RA/CREST overlap on Orencia  125 mg subcu weekly hydroxychloroquine  300 mg daily and methotrexate  10 mg p.o. weekly and folic acid  1 mg daily.  Since her last visit she became sick with viral upper respiratory symptoms about a month ago these lingered for about 3 weeks with congestion drainage and cough no fevers.  Treatment cleared up and she went to the beach at [facility] with increased physical activity there did have some flareup of bilateral knee swelling worse on the right side started taking prednisone  taper for this initially 20 mg daily down to 10.  Not having any ongoing pulmonary symptoms.   Previous HPI 07/03/2022 Dawn Griffin is a 53 y.o. female here for follow up for RA/CREST overlap on Orencia  125 mg subcu weekly hydroxychloroquine  300 mg daily and methotrexate  10 mg p.o. weekly and folic acid  1 mg daily.  Overall she has been doing pretty well a few symptoms have acted up feels she is having some swelling in the right knee particularly first thing in the mornings that is more than previous.  Is also noticing some difficulty fully flexing the right index finger and occasionally gets a rubbing or popping sensation.  Has noticed new pitting lesions in the fingertips especially over the wintertime.   Previous HPI 12/31/21 Dawn Griffin is a 53 y.o. female here for follow up for RA/CREST overlap on Orencia  125 mg subcu weekly hydroxychloroquine  300 mg daily and methotrexate  10 mg p.o. weekly and folic acid  1 mg daily.  Overall has been doing fairly well since her last visit without major symptom exacerbations.  She was switched on medicines for the pancreatic insufficiency due to loss of efficacy on her previous treatment and is doing well again.  She had some basal cell cancers excised uneventfully.  Continues to have joint pain affecting the right  shoulder low back and left hip has not seen peripheral joint swelling come back.  Raynaud's symptoms have been reasonably controlled she is taking the ADHD medications only as needed.   Previous HPI 06/27/2021 Dawn Griffin is a 53 y.o. female here for follow up for RA/CREST overlap on orencia  125 mg [place] weekly, HCQ 300 mg daily, and MTX 10 mg PO weekly.  Her RA has been doing well followed up with her doctors in [place] felt this is stable.  She saw Dr. Jorizzo with no new concerning findings on skin exam at that time recommended to continue medications including the methotrexate . She has increase in low back pain since around Christmas time she thinks this was some type of overuse or injury when carrying kids around.  Since this started she had severe pain with left leg symptoms with numbness and pain going  down the leg and in the foot.  She had updated imaging of her low back in January and epidural shot performed was very helpful for the radicular symptoms.  She has scheduled upcoming appointments with orthopedic surgery and neurosurgery about more definitive management options for this problem.   Previous HPI 02/26/21 Dawn Griffin is a 53 y.o. female here for follow up for RA/CREST overlap on orencia  125 mg [place] weekly, HCQ 300 mg daily, MTX 10 mg PO weekly, and folic acid  1 mg daily. We decreased methotrexate  dose after last visit due to elevation in liver function tests.  Overall she feels her arthritis and scleroderma symptoms are pretty well controlled.  She is experiencing increased verity of Raynaud's symptoms with some new digital pitting during the colder weather but is very proactive with coats gloves and hot hands heating packs.  Continuing to have good GI symptom improvement with the Creon .  Overall she feels her mood and body aches are just overall worse and feels very down which she states has come and gone periodically in previous months.     Previous HPI 05/25/20 Dawn Griffin  is a 53 y.o. female with history of carotid artery aneurysm and centrilobular emphysema here for evaluation of rheumatoid arthritis currently on methotrexate  and orencia  infusion. 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 She was first evaluated with rheumatology around 2014 due to hand pain, stiffness, skin changes, raynaud's, telangiectasias but not started any long term treatments until in 2018 with worsening symptoms was diagnosed with RA. She takes methotrexate  and hydroxychloroquine  for over a year without significant improvement in symptoms, mostly only feeling improvement when on low dose prednisone . Enbrel was added to her treatment but she does not feel this made a particularly good benefit in symptoms. She start Orencia  infusion IV since about 2 months ago and also has not seen much improvement yet, although tolerating the treatment okay. She does not have much joint swelling most of the time, although in the past when she stopped all medicines for a time large bilateral knee swelling occurred and also hand swelling involving the whole hand and fingers. Doing okay today, currently on 10 mg prednisone  she restarted taking it at 20 mg dose and decreased it feeling a bit worse but currently much better than off the medicine.   She had extensive cardiac workup in December with abnormal EKG and subsequent TTE without any obvious problems and cardiac CT checked with no coronary disease problems seen. She was also found to have carotid artery aneurysm during evaluation for vagal nerve stimulator placement as a participant with RESET-RA trial.   She has skin thickening and Raynaud's numerous telangiectasias on hands bilaterally.  She is not currently on any medicine for this.  She has a few areas of small pitting or scarring.  She previously did not try calcium channel blocker due to concern of side effects with a fairly low baseline blood pressure.   She has chronic diarrhea symptoms has had colonoscopy that was unremarkable.  She is also had swallowing study but does not describe prior upper GI endoscopy.   She has pulmonary emphysema but with good function on PFTs and is following up regularly with pulmonology.  This has been attributed to prior smoking history which she has to reduce but not stopped entirely.  She does have pretty frequent shortness of breath with exertion but no history of hypoxia or abnormal stress testing or PFTs.  She does  not notice significant peripheral edema.   Labs reviewed 04/2016 ANA 1:1280 centromere RF 16.3 CCP 117   Review of Systems  Constitutional:  Negative for fatigue.  HENT:  Positive for mouth dryness. Negative for mouth sores.   Eyes:  Negative for dryness.  Respiratory:  Negative for shortness of breath.   Cardiovascular:  Negative for chest pain and palpitations.  Gastrointestinal:  Negative for blood in stool, constipation and diarrhea.  Endocrine: Negative for increased urination.  Genitourinary:  Positive for involuntary urination.  Musculoskeletal:  Positive for joint pain, joint pain and morning stiffness. Negative for gait problem, joint swelling, myalgias, muscle weakness, muscle tenderness and myalgias.  Skin:  Negative for color change, rash, hair loss and sensitivity to sunlight.  Allergic/Immunologic: Negative for susceptible to infections.  Neurological:  Negative for dizziness and headaches.  Hematological:  Negative for swollen glands.  Psychiatric/Behavioral:  Negative for depressed mood and sleep disturbance. The patient is not nervous/anxious.     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   GERD (gastroesophageal reflux disease) 07/06/2020   High risk medication use 05/25/2020   Vitamin D  deficiency 05/25/2020   S/P coil embolization of cerebral aneurysm 02/28/2020   Centrilobular emphysema (HCC) 08/04/2019   Chronic fatigue 05/10/2019   Brain fog 05/10/2019   Myofascial pain 06/30/2018   Rheumatoid arthritis (HCC) 05/20/2017   Bilateral carpal tunnel syndrome, left cubital tunnel syndrome 08/17/2013   Arthritis of left carpometacarpal joint 02/26/2013   SOB (shortness of breath) 02/26/2013   ANEMIA, HX OF 01/10/2010   URINARY URGENCY 12/21/2009   Enlarged lymph nodes 08/15/2008    Past Medical History:  Diagnosis Date   Aneurysm 02/2020   Aortic atherosclerosis    CREST (calcinosis, Raynaud's phenomenon, esophageal dysfunction, sclerodactyly, telangiectasia) (HCC) 2022   Emphysema of lung (HCC)    GERD (gastroesophageal reflux disease)    IBS (irritable bowel syndrome)    Irritable bowel syndrome with diarrhea    Pancreatic divisum    Raynaud disease    Rheumatoid arthritis (HCC)    Sessile colonic polyp     Family History  Problem Relation Age of Onset   Bipolar disorder Mother    Lung cancer Mother    Lung cancer Father    Gout Father    Healthy Sister    Lupus Sister    Arthritis/Rheumatoid Maternal Grandmother    Stomach cancer Paternal Grandmother    Healthy Brother    Colon cancer Neg Hx    Esophageal cancer Neg Hx    Colon polyps Neg Hx    Rectal cancer Neg Hx    Breast cancer Neg Hx    Past Surgical History:  Procedure Laterality Date   ANEURYSM COILING  02/2020   BRAIN SURGERY  02/2020   Coil aneurysm   ELBOW SURGERY Left    FINGER ARTHROSCOPY WITH CARPOMETACARPEL ([facility]) ARTHROPLASTY Left 03/2019   LUMBAR SPINE SURGERY  2001   L4-5-S1   SPINE SURGERY  2000   L5/S1   TONSILLECTOMY  1990   ULNAR NERVE TRANSPOSITION Left 04/10/2023   ULNAR TUNNEL RELEASE Left 12/12/2022   VAGUS NERVE  STIMULATOR INSERTION  09/2019   Social History  Social History Narrative   No exercise. + daily caffeine.    Immunization History  Administered Date(s) Administered   Influenza Whole 01/10/2010   Tdap 01/11/2013, 03/27/2023     Objective: Vital Signs: BP 106/64   Pulse 80   Temp 98 F (36.7 C)   Resp 16   Ht 5' 4 (1.626 m)   Wt 119 lb 9.6 oz (54.3 kg)   BMI 20.53 kg/m    Physical Exam Eyes:     Conjunctiva/sclera: Conjunctivae normal.  Cardiovascular:     Rate and Rhythm: Normal rate and regular rhythm.  Pulmonary:     Effort: Pulmonary effort is normal.     Breath sounds: Normal breath sounds.  Lymphadenopathy:     Cervical: No cervical adenopathy.  Skin:    General: Skin is warm and dry.     Comments: Skin thickening over fingers of both hands distal to MCPs Numerous fingertip and palmar telangiectasias, some on lip border AKs on back of right hand  Neurological:     Mental Status: She is alert.     Comments: Left 4th/5th digits persistent numbness  Psychiatric:        Mood and Affect: Mood normal.      Musculoskeletal Exam:  Shoulders full ROM no tenderness or swelling Left elbow tenderness at medial epicondyle without palpable swelling or radiation Wrists full ROM no tenderness or swelling Fingers full ROM, skin tightness restrictive, MCP and PIP tenderness without palpable synovitis, left thumb squaring and MCP subluxation Knees full ROM, no effusions, patellofemoral crepitus present Ankles full ROM no tenderness or swelling   Investigation: No additional findings.  Imaging: No results found.  Recent Labs: Lab Results  Component Value Date   WBC 11.8 (H) 12/25/2023   HGB 13.2 12/25/2023   PLT 327 12/25/2023   NA 138 12/25/2023   K 4.1 12/25/2023   CL 100 12/25/2023   CO2 23 12/25/2023   GLUCOSE 114 (H) 12/25/2023   BUN 12 12/25/2023   CREATININE 0.68 12/25/2023   BILITOT 0.2 12/25/2023   ALKPHOS 54 12/25/2023   AST 26 12/25/2023    ALT 24 12/25/2023   PROT 6.7 12/25/2023   ALBUMIN 4.5 12/25/2023   CALCIUM 9.7 12/25/2023   GFRAA 102 09/06/2020   QFTBGOLDPLUS NEGATIVE 04/17/2023    Speciality Comments: PLQ Eye Exam 10/24/2023  WNL @ [facility] f/u 1 year  Procedures:  No procedures performed Allergies: Patient has no known allergies.   Assessment / Plan:     Visit Diagnoses: Rheumatoid arthritis involving both hands with positive rheumatoid factor (HCC) Increased hand pain and stiffness, especially in the mornings, exacerbated by physical activity. No significant swelling or tenderness. Maintaining off biologic DMARD both with oral drug maintenance and with RESET RA nerve stimulator treatment. - Continue azathioprine  100 mg daily and hydroxychloroquine  200 mg daily. - Monitor symptoms and avoid excessive physical activity.  High risk medication use - Orencia  125 mg subcu weekly, hydroxychloroquine  200 mg daily, and Azathioprine  100 mg daily On azathioprine , requires monitoring for lymphopenia and liver enzyme changes. Tolerating medication well. - Check white blood cell count and liver enzymes as needed. - Coordinate with other providers to avoid duplicate testing.  Bilateral primary osteoarthritis of knees Mild osteoarthritis with pain in the medial compartment, exacerbated by physical activity. Good range of motion, no significant swelling. - Continue to monitor symptoms and avoid excessive physical activity.  Actinic keratosis Lesions mild, not at high risk for progression to squamous cell carcinoma. Scleroderma skin may  heal slowly, higher threshold for intervention considered. - Continue to monitor lesions for changes over the next year.  Orders: No orders of the defined types were placed in this encounter.  No orders of the defined types were placed in this encounter.    Follow-Up Instructions: Return in about 3 months (around 06/15/2024) for RA/CREST on HCQ/AZA f/u 3mos.   Lonni LELON Ester, MD  Note  - This record has been created using [employer].  Chart creation errors have been sought, but may not always  have been located. Such creation errors do not reflect on  the standard of medical care. "

## 2024-03-17 ENCOUNTER — Ambulatory Visit: Attending: Internal Medicine | Admitting: Internal Medicine

## 2024-03-17 ENCOUNTER — Encounter: Payer: Self-pay | Admitting: Internal Medicine

## 2024-03-17 VITALS — BP 106/64 | HR 80 | Temp 98.0°F | Resp 16 | Ht 64.0 in | Wt 119.6 lb

## 2024-03-17 DIAGNOSIS — Z79899 Other long term (current) drug therapy: Secondary | ICD-10-CM

## 2024-03-17 DIAGNOSIS — M05742 Rheumatoid arthritis with rheumatoid factor of left hand without organ or systems involvement: Secondary | ICD-10-CM | POA: Diagnosis not present

## 2024-03-17 DIAGNOSIS — M05741 Rheumatoid arthritis with rheumatoid factor of right hand without organ or systems involvement: Secondary | ICD-10-CM | POA: Diagnosis not present

## 2024-03-17 DIAGNOSIS — M47816 Spondylosis without myelopathy or radiculopathy, lumbar region: Secondary | ICD-10-CM

## 2024-03-26 ENCOUNTER — Encounter: Payer: Commercial Managed Care - PPO | Admitting: Family Medicine

## 2024-06-15 ENCOUNTER — Ambulatory Visit: Admitting: Internal Medicine
